# Patient Record
Sex: Male | Born: 1950 | Race: White | Hispanic: No | Marital: Married | Smoking: Never smoker
Health system: Southern US, Community
[De-identification: ages and names within clinical notes are randomized; demographics above are authoritative.]

## PROBLEM LIST (undated history)

## (undated) DIAGNOSIS — I8390 Asymptomatic varicose veins of unspecified lower extremity: Secondary | ICD-10-CM

## (undated) DIAGNOSIS — B353 Tinea pedis: Secondary | ICD-10-CM

## (undated) DIAGNOSIS — R7309 Other abnormal glucose: Secondary | ICD-10-CM

## (undated) DIAGNOSIS — Z86010 Personal history of colonic polyps: Secondary | ICD-10-CM

## (undated) DIAGNOSIS — M79606 Pain in leg, unspecified: Secondary | ICD-10-CM

## (undated) HISTORY — DX: Personal history of colonic polyps: Z86.010

## (undated) HISTORY — DX: Asymptomatic varicose veins of unspecified lower extremity: I83.90

## (undated) HISTORY — DX: Tinea pedis: B35.3

## (undated) HISTORY — DX: Other abnormal glucose: R73.09

## (undated) HISTORY — PX: VARICOSE VEIN SURGERY: SHX832

## (undated) HISTORY — PX: CATARACT EXTRACTION, BILATERAL: SHX1313

## (undated) HISTORY — DX: Pain in leg, unspecified: M79.606

---

## 1997-11-01 HISTORY — PX: KNEE SURGERY: SHX244

## 2002-03-01 ENCOUNTER — Encounter: Payer: Self-pay | Admitting: Internal Medicine

## 2002-03-01 LAB — CONVERTED CEMR LAB

## 2002-03-27 ENCOUNTER — Encounter: Payer: Self-pay | Admitting: Internal Medicine

## 2004-10-28 ENCOUNTER — Ambulatory Visit: Payer: Self-pay | Admitting: Internal Medicine

## 2004-11-25 ENCOUNTER — Ambulatory Visit: Payer: Self-pay | Admitting: Gastroenterology

## 2004-12-09 ENCOUNTER — Encounter: Payer: Self-pay | Admitting: Internal Medicine

## 2004-12-09 ENCOUNTER — Ambulatory Visit: Payer: Self-pay | Admitting: Gastroenterology

## 2005-06-23 ENCOUNTER — Ambulatory Visit: Payer: Self-pay | Admitting: Internal Medicine

## 2005-09-15 ENCOUNTER — Ambulatory Visit: Payer: Self-pay | Admitting: Internal Medicine

## 2005-12-15 ENCOUNTER — Ambulatory Visit: Payer: Self-pay | Admitting: Internal Medicine

## 2005-12-22 ENCOUNTER — Ambulatory Visit: Payer: Self-pay | Admitting: Internal Medicine

## 2006-03-23 ENCOUNTER — Ambulatory Visit: Payer: Self-pay | Admitting: Internal Medicine

## 2007-05-25 ENCOUNTER — Encounter: Payer: Self-pay | Admitting: Internal Medicine

## 2007-05-25 DIAGNOSIS — Z860100 Personal history of colon polyps, unspecified: Secondary | ICD-10-CM

## 2007-05-25 HISTORY — DX: Personal history of colon polyps, unspecified: Z86.0100

## 2007-05-25 HISTORY — DX: Personal history of colonic polyps: Z86.010

## 2007-05-31 ENCOUNTER — Ambulatory Visit: Payer: Self-pay | Admitting: Internal Medicine

## 2007-05-31 LAB — CONVERTED CEMR LAB
Bilirubin Urine: NEGATIVE
Blood in Urine, dipstick: NEGATIVE
Glucose, Urine, Semiquant: NEGATIVE
Ketones, urine, test strip: NEGATIVE
Nitrite: NEGATIVE
Protein, U semiquant: NEGATIVE
Specific Gravity, Urine: 1.02
Urobilinogen, UA: 0.2
WBC Urine, dipstick: NEGATIVE
pH: 8

## 2007-06-01 LAB — CONVERTED CEMR LAB
ALT: 22 units/L (ref 0–53)
AST: 27 units/L (ref 0–37)
Albumin: 4 g/dL (ref 3.5–5.2)
Alkaline Phosphatase: 70 units/L (ref 39–117)
BUN: 18 mg/dL (ref 6–23)
Basophils Absolute: 0 10*3/uL (ref 0.0–0.1)
Basophils Relative: 0 % (ref 0.0–1.0)
Bilirubin, Direct: 0.1 mg/dL (ref 0.0–0.3)
CO2: 30 meq/L (ref 19–32)
Calcium: 9.7 mg/dL (ref 8.4–10.5)
Chloride: 103 meq/L (ref 96–112)
Cholesterol: 182 mg/dL (ref 0–200)
Creatinine, Ser: 1 mg/dL (ref 0.4–1.5)
Eosinophils Absolute: 0.2 10*3/uL (ref 0.0–0.6)
Eosinophils Relative: 3.5 % (ref 0.0–5.0)
GFR calc Af Amer: 99 mL/min
GFR calc non Af Amer: 82 mL/min
Glucose, Bld: 111 mg/dL — ABNORMAL HIGH (ref 70–99)
HCT: 43.5 % (ref 39.0–52.0)
HDL: 37.4 mg/dL — ABNORMAL LOW (ref >39.0)
Hemoglobin: 15.1 g/dL (ref 13.0–17.0)
LDL Cholesterol: 132 mg/dL — ABNORMAL HIGH (ref 0–99)
Lymphocytes Relative: 31.3 % (ref 12.0–46.0)
MCHC: 34.8 g/dL (ref 30.0–36.0)
MCV: 89.6 fL (ref 78.0–100.0)
Monocytes Absolute: 0.4 10*3/uL (ref 0.2–0.7)
Monocytes Relative: 8.4 % (ref 3.0–11.0)
Neutro Abs: 2.8 10*3/uL (ref 1.4–7.7)
Neutrophils Relative %: 56.8 % (ref 43.0–77.0)
PSA: 0.67 ng/mL (ref 0.10–4.00)
Platelets: 260 10*3/uL (ref 150–400)
Potassium: 5.2 meq/L — ABNORMAL HIGH (ref 3.5–5.1)
RBC: 4.85 M/uL (ref 4.22–5.81)
RDW: 12.2 % (ref 11.5–14.6)
Sodium: 139 meq/L (ref 135–145)
TSH: 1.17 microintl units/mL (ref 0.35–5.50)
Total Bilirubin: 1.1 mg/dL (ref 0.3–1.2)
Total CHOL/HDL Ratio: 4.9
Total Protein: 6.3 g/dL (ref 6.0–8.3)
Triglycerides: 63 mg/dL (ref 0–149)
VLDL: 13 mg/dL (ref 0–40)
WBC: 5 10*3/uL (ref 4.5–10.5)

## 2007-06-21 ENCOUNTER — Encounter: Payer: Self-pay | Admitting: Internal Medicine

## 2007-06-21 ENCOUNTER — Ambulatory Visit: Payer: Self-pay | Admitting: Vascular Surgery

## 2007-07-17 ENCOUNTER — Telehealth (INDEPENDENT_AMBULATORY_CARE_PROVIDER_SITE_OTHER): Payer: Self-pay | Admitting: *Deleted

## 2007-07-19 ENCOUNTER — Ambulatory Visit: Payer: Self-pay | Admitting: Internal Medicine

## 2007-07-19 DIAGNOSIS — B353 Tinea pedis: Secondary | ICD-10-CM

## 2007-07-19 HISTORY — DX: Tinea pedis: B35.3

## 2007-08-16 ENCOUNTER — Ambulatory Visit: Payer: Self-pay | Admitting: Internal Medicine

## 2007-08-16 DIAGNOSIS — R079 Chest pain, unspecified: Secondary | ICD-10-CM

## 2007-09-20 ENCOUNTER — Ambulatory Visit: Payer: Self-pay | Admitting: Vascular Surgery

## 2007-09-20 ENCOUNTER — Encounter: Payer: Self-pay | Admitting: Internal Medicine

## 2007-11-01 ENCOUNTER — Ambulatory Visit: Payer: Self-pay | Admitting: Vascular Surgery

## 2007-11-08 ENCOUNTER — Ambulatory Visit: Payer: Self-pay | Admitting: Vascular Surgery

## 2007-12-20 ENCOUNTER — Ambulatory Visit: Payer: Self-pay | Admitting: Vascular Surgery

## 2008-07-10 ENCOUNTER — Ambulatory Visit: Payer: Self-pay | Admitting: Internal Medicine

## 2008-07-10 LAB — CONVERTED CEMR LAB
ALT: 25 units/L (ref 0–53)
AST: 24 units/L (ref 0–37)
Alkaline Phosphatase: 65 units/L (ref 39–117)
BUN: 21 mg/dL (ref 6–23)
Bilirubin Urine: NEGATIVE
Calcium: 8.9 mg/dL (ref 8.4–10.5)
Cholesterol: 148 mg/dL (ref 0–200)
Eosinophils Absolute: 0.2 10*3/uL (ref 0.0–0.7)
Glucose, Bld: 118 mg/dL — ABNORMAL HIGH (ref 70–99)
Glucose, Urine, Semiquant: NEGATIVE
HCT: 44 % (ref 39.0–52.0)
Ketones, urine, test strip: NEGATIVE
Lymphocytes Relative: 29.8 % (ref 12.0–46.0)
MCV: 91.9 fL (ref 78.0–100.0)
Monocytes Absolute: 0.4 10*3/uL (ref 0.1–1.0)
Neutro Abs: 2.8 10*3/uL (ref 1.4–7.7)
PSA: 0.44 ng/mL (ref 0.10–4.00)
Potassium: 4.9 meq/L (ref 3.5–5.1)
RBC: 4.78 M/uL (ref 4.22–5.81)
Sodium: 142 meq/L (ref 135–145)
Total CHOL/HDL Ratio: 3.3
Total Protein: 6.9 g/dL (ref 6.0–8.3)
Urobilinogen, UA: 1
WBC Urine, dipstick: NEGATIVE

## 2008-07-22 ENCOUNTER — Telehealth: Payer: Self-pay | Admitting: Internal Medicine

## 2008-08-07 ENCOUNTER — Ambulatory Visit: Payer: Self-pay | Admitting: Internal Medicine

## 2008-08-07 DIAGNOSIS — R7309 Other abnormal glucose: Secondary | ICD-10-CM

## 2008-08-07 HISTORY — DX: Other abnormal glucose: R73.09

## 2009-08-13 ENCOUNTER — Ambulatory Visit: Payer: Self-pay | Admitting: Internal Medicine

## 2009-11-06 ENCOUNTER — Ambulatory Visit: Payer: Self-pay | Admitting: Family Medicine

## 2009-11-06 DIAGNOSIS — L02419 Cutaneous abscess of limb, unspecified: Secondary | ICD-10-CM

## 2009-11-06 DIAGNOSIS — L03119 Cellulitis of unspecified part of limb: Secondary | ICD-10-CM

## 2009-11-06 LAB — CONVERTED CEMR LAB: Blood Glucose, Fingerstick: 133

## 2009-11-07 ENCOUNTER — Ambulatory Visit: Payer: Self-pay | Admitting: Internal Medicine

## 2009-11-08 ENCOUNTER — Ambulatory Visit: Payer: Self-pay | Admitting: Vascular Surgery

## 2009-11-08 ENCOUNTER — Observation Stay (HOSPITAL_COMMUNITY): Admission: EM | Admit: 2009-11-08 | Discharge: 2009-11-10 | Payer: Self-pay | Admitting: Emergency Medicine

## 2009-11-08 ENCOUNTER — Encounter (INDEPENDENT_AMBULATORY_CARE_PROVIDER_SITE_OTHER): Payer: Self-pay | Admitting: Internal Medicine

## 2009-11-10 ENCOUNTER — Telehealth: Payer: Self-pay | Admitting: Internal Medicine

## 2009-11-13 ENCOUNTER — Encounter (INDEPENDENT_AMBULATORY_CARE_PROVIDER_SITE_OTHER): Payer: Self-pay | Admitting: *Deleted

## 2009-11-13 ENCOUNTER — Ambulatory Visit: Payer: Self-pay | Admitting: Internal Medicine

## 2009-11-19 ENCOUNTER — Ambulatory Visit: Payer: Self-pay | Admitting: Internal Medicine

## 2009-12-16 ENCOUNTER — Telehealth: Payer: Self-pay | Admitting: Internal Medicine

## 2010-01-20 ENCOUNTER — Ambulatory Visit: Payer: Self-pay | Admitting: Family Medicine

## 2010-01-21 ENCOUNTER — Ambulatory Visit: Payer: Self-pay | Admitting: Internal Medicine

## 2010-01-22 ENCOUNTER — Ambulatory Visit: Payer: Self-pay | Admitting: Internal Medicine

## 2010-01-26 ENCOUNTER — Ambulatory Visit: Payer: Self-pay | Admitting: Internal Medicine

## 2010-01-28 ENCOUNTER — Telehealth: Payer: Self-pay | Admitting: Internal Medicine

## 2010-03-10 ENCOUNTER — Telehealth: Payer: Self-pay | Admitting: Internal Medicine

## 2010-03-11 ENCOUNTER — Ambulatory Visit: Payer: Self-pay | Admitting: Internal Medicine

## 2010-03-23 ENCOUNTER — Telehealth: Payer: Self-pay | Admitting: Gastroenterology

## 2010-03-24 ENCOUNTER — Encounter (INDEPENDENT_AMBULATORY_CARE_PROVIDER_SITE_OTHER): Payer: Self-pay | Admitting: *Deleted

## 2010-03-25 ENCOUNTER — Ambulatory Visit: Payer: Self-pay | Admitting: Gastroenterology

## 2010-03-31 ENCOUNTER — Encounter: Payer: Self-pay | Admitting: Gastroenterology

## 2010-03-31 ENCOUNTER — Ambulatory Visit: Payer: Self-pay | Admitting: Gastroenterology

## 2010-03-31 ENCOUNTER — Ambulatory Visit (HOSPITAL_COMMUNITY): Admission: RE | Admit: 2010-03-31 | Discharge: 2010-03-31 | Payer: Self-pay | Admitting: Gastroenterology

## 2010-07-23 ENCOUNTER — Ambulatory Visit: Payer: Self-pay | Admitting: Internal Medicine

## 2010-11-26 ENCOUNTER — Telehealth: Payer: Self-pay | Admitting: Internal Medicine

## 2010-12-01 NOTE — Assessment & Plan Note (Signed)
Summary: FEVER, N/V // RS   Vital Signs:  Patient profile:   60 year old male Temp:     98.6 degrees F oral BP sitting:   120 / 90  (left arm) Cuff size:   regular  Vitals Entered By: Sid Falcon LPN (November 06, 2009 2:51 PM) CC: Fever 3-4 days with nausea and vomiting CBG Result 133   History of Present Illness: Acute visit. Chief complaint is fever. Had nausea and some vomiting on Monday but none since. No diarrhea. Fever ranging from 100-102 for the past 3-4 days. Has difficulty walking secondary to some leg pain. No leg injury. Patient denies cough, sore throat, abdominal pain, or dysuria. Wife works for pediatric office and flu screen negative.  Patient has chronic problems including reported history of hyperglycemia. Takes aspirin and Paxil. No known drug allergies.  Allergies: No Known Drug Allergies  Past History:  Past Medical History: Last updated: 05/25/2007 Colonic polyps, hx of  2006 knee fx   ~1999  Past Surgical History: Last updated: 08/07/2008 knee fx surgery   ~1999 vein stripping  Social History: Last updated: 07/19/2007 Married Never Smoked Regular exercise-no  Review of Systems       The patient complains of fever.  The patient denies chest pain, prolonged cough, and abdominal pain.    Physical Exam  General:  Well-developed,well-nourished,in no acute distress; alert,appropriate and cooperative throughout examination Ears:  External ear exam shows no significant lesions or deformities.  Otoscopic examination reveals clear canals, tympanic membranes are intact bilaterally without bulging, retraction, inflammation or discharge. Hearing is grossly normal bilaterally. Nose:  External nasal examination shows no deformity or inflammation. Nasal mucosa are pink and moist without lesions or exudates. Mouth:  Oral mucosa and oropharynx without lesions or exudates.  Teeth in good repair. Neck:  No deformities, masses, or tenderness noted.supple with no  adenopathy Lungs:  Normal respiratory effort, chest expands symmetrically. Lungs are clear to auscultation, no crackles or wheezes. Heart:  Normal rate and regular rhythm. S1 and S2 normal without gallop, murmur, click, rub or other extra sounds. Abdomen:  Bowel sounds positive,abdomen soft and non-tender without masses, organomegaly or hernias noted. Extremities:  patient has diffuse erythema, moderate swelling and increased warmth of the left lower leg with some erythematous streaks extending as high as the knee. There is erythema down as far as the ankle but not involving the foot. No obvious breaks in skin.   Impression & Recommendations:  Problem # 1:  CELLULITIS, LEG, LEFT (ICD-682.6) Assessment New  patient presents with fairly severe cellulitis left lower extremity with associated fever. Does not appear toxic but is very intense erythema involving almost the entire left leg.  Rocephin 1 gm Im given and start oral Cephalexin and reassess tomorrow with his primary.  His updated medication list for this problem includes:    Cephalexin 500 Mg Caps (Cephalexin) ..... One by mouth three times a day for 10 days.  Orders: Admin of Therapeutic Inj  intramuscular or subcutaneous (09811) Rocephin  250mg  (B1478)  Complete Medication List: 1)  Paxil 20 Mg Tabs (Paroxetine hcl) .... Take 1 tablet by mouth once a day 2)  Aspirin 81 Mg Tabs (Aspirin) .... Take 1 tablet by mouth once a day 3)  Cephalexin 500 Mg Caps (Cephalexin) .... One by mouth three times a day for 10 days.  Other Orders: Glucose, (CBG) (29562) Capillary Blood Glucose/CBG (13086)  Patient Instructions: 1)  Keep left lower extremity elevated. 2)  Use heating  pad to left lower extremity frequently 3)  Schedule followup with your primary physician tomorrow for reassessment. Prescriptions: CEPHALEXIN 500 MG CAPS (CEPHALEXIN) one by mouth three times a day for 10 days.  #30 x 0   Entered and Authorized by:   Evelena Peat  MD   Signed by:   Sid Falcon LPN on 16/08/9603   Method used:   Electronically to        [facility]       [address]       [place], [place]  [address]       Ph: [phone]       Fax: [phone]   RxID:   [id number]    Medication Administration  Injection # 1:    Medication: Rocephin  250mg     Diagnosis: CELLULITIS, LEG, LEFT (ICD-682.6)    Route: IM    Site: LUOQ gluteus    Exp Date: 05/02/2011    Lot #: [id number]    Mfr: Novaplus    Comments: 1 gram given    Patient tolerated injection without complications    Given by: Sid Falcon LPN (November 06, 2009 4:16 PM)  Orders Added: 1)  Est. Patient Level IV [36644] 2)  Glucose, (CBG) [82962] 3)  Capillary Blood Glucose/CBG [82948] 4)  Admin of Therapeutic Inj  intramuscular or subcutaneous [96372] 5)  Rocephin  250mg  [I3474]

## 2010-12-01 NOTE — Progress Notes (Signed)
Summary: leg still swollen  Phone Note Call from Patient   Caller: Patient Call For: Birdie Sons MD Summary of Call: leg still swollen- was D/C'd from hospital 3 weeks ago. No pain or drainage- just swollen- is this to be expected? Initial call taken by: Raechel Ache, RN,  December 16, 2009 1:03 PM  Follow-up for Phone Call        yes, but I'd like him to get compression stockings: 5-20 mmHg knee high put on every morning, remove before going to sleep Follow-up by: Birdie Sons MD,  December 16, 2009 2:41 PM  Additional Follow-up for Phone Call Additional follow up Details #1::        LMOM to get compression stockings at [facility] Additional Follow-up by: Raechel Ache, RN,  December 16, 2009 3:24 PM

## 2010-12-01 NOTE — Assessment & Plan Note (Signed)
Summary: 4 DAY FUP--OK PER DR Hardy Harcum//CCM   History of Present Illness: Acute visit for fever of 103last week and erythema left leg. Complicated cellulitis several months ago which required hospitalization after failed outpatient management.  Same leg involved several months ago. Denies any other symptoms such as sore throat, cough, or urinary symptoms. No nausea or vomiting. No known drug allergies. With recent infection eventually improved with IV antibiotics. No recent injury. reviewed previous notes he is feeling better---no recurent fever  Current Problems (verified): 1)  Cellulitis, Leg, Left  (ICD-682.6) 2)  Hyperglycemia  (ICD-790.29) 3)  Chest Pain  (ICD-786.50) 4)  Tinea Pedis  (ICD-110.4) 5)  Family History Seizures  (ICD-V17.2) 6)  Preventive Health Care  (ICD-V70.0) 7)  Colonic Polyps, Hx of  (ICD-V12.72)  Current Medications (verified): 1)  Paxil 20 Mg  Tabs (Paroxetine Hcl) .... Take 1 Tablet By Mouth Once A Day 2)  Aspirin 81 Mg  Tabs (Aspirin) .... Take 1 Tablet By Mouth Once A Day 3)  Nystatin-Triamcinolone 100000-0.1 Unit/gm-% Crea (Nystatin-Triamcinolone) .... Apply Two Times A Day To Both Feet For 14 Days 4)  Amoxicillin-Pot Clavulanate 875-125 Mg Tabs (Amoxicillin-Pot Clavulanate) .... One By Mouth Two Times A Day For 10 Days  Allergies (verified): No Known Drug Allergies  Past History:  Past Medical History: Last updated: 05/25/2007 Colonic polyps, hx of  2006 knee fx   ~1999  Past Surgical History: Last updated: 08/07/2008 knee fx surgery   ~1999 vein stripping  Family History: Last updated: 06/13/2007 Family History Other cancer-breast,prostate Family History Seizures  Social History: Last updated: 07/19/2007 Married Never Smoked Regular exercise-no  Risk Factors: Exercise: no (07/19/2007)  Risk Factors: Smoking Status: never (11/19/2009)  Physical Exam  General:  alert and well-developed.   Skin:  improving cellulitis of  leg decreased erythema, no pain   Impression & Recommendations:  Problem # 1:  CELLULITIS, LEG, LEFT (ICD-682.6)  much improvied complression stockings complete all antibiotics His updated medication list for this problem includes:    Amoxicillin-pot Clavulanate 875-125 Mg Tabs (Amoxicillin-pot clavulanate) ..... One by mouth two times a day for 10 days  Elevate affected area. Warm moist compresses for 20 minutes every 2 hours while awake. Take antibiotics as directed and take acetaminophen as needed. To be seen in 48-72 hours if no improvement, sooner if worse.  Complete Medication List: 1)  Paxil 20 Mg Tabs (Paroxetine hcl) .... Take 1 tablet by mouth once a day 2)  Aspirin 81 Mg Tabs (Aspirin) .... Take 1 tablet by mouth once a day 3)  Nystatin-triamcinolone 100000-0.1 Unit/gm-% Crea (Nystatin-triamcinolone) .... Apply two times a day to both feet for 14 days 4)  Amoxicillin-pot Clavulanate 875-125 Mg Tabs (Amoxicillin-pot clavulanate) .... One by mouth two times a day for 10 days

## 2010-12-01 NOTE — Letter (Signed)
Summary: Colonoscopy Letter  [facility]  [address] [place], [place] [address]   Phone: [phone]  Fax: [phone]      November 13, 2009 MRN: [id number]   MORRILL BOMKAMP [address] [place], [place]  [address]   Dear Mr. Cieslinski,   According to your medical record, it is time for you to schedule a Colonoscopy. The American Cancer Society recommends this procedure as a method to detect early colon cancer. Patients with a family history of colon cancer, or a personal history of colon polyps or inflammatory bowel disease are at increased risk.  This letter has beeen generated based on the recommendations made at the time of your procedure. If you feel that in your particular situation this may no longer apply, please contact our office.  Please call our office at [phone] to schedule this appointment or to update your records at your earliest convenience.  Thank you for cooperating with [place] to provide you with the very best care possible.   Sincerely,  Judie Petit T. Russella Dar, M.D.  [facility] [phone]

## 2010-12-01 NOTE — Procedures (Signed)
Summary: Colonoscopy  Patient: Shawn Wood Note: All result statuses are Final unless otherwise noted.  Tests: (1) Colonoscopy (COL)   COL Colonoscopy           DONE (C)     [facility]     [address] [place], [place]  [address]           COLONOSCOPY PROCEDURE REPORT           PATIENT:  Shawn, Wood  MR#:  [id number]     BIRTHDATE:  [birth year], 58 yrs. old  GENDER:  male     ENDOSCOPIST:  Judie Petit T. Russella Dar, MD, [facility]           PROCEDURE DATE:  03/31/2010     PROCEDURE:  Colonoscopy 19[address]     ASA CLASS:  Class II     INDICATIONS:  1) follow-up of polyp  2) family history of colon     cancer. Adenomatous polyp, 12/2004. Father with colon cancer. High     risk screening and surveillance.     MEDICATIONS:   Fentanyl 75 mcg IV, Versed 7.5 mg IV     DESCRIPTION OF PROCEDURE:   After the risks benefits and     alternatives of the procedure were thoroughly explained, informed     consent was obtained.  Digital rectal exam was performed and     revealed no abnormalities.   The Pentax Colonoscope [id number]     endoscope was introduced through the anus and advanced to the     cecum, which was identified by both the appendix and ileocecal     valve, without limitations.  The quality of the prep was     excellent, using MoviPrep.  The instrument was then slowly     withdrawn as the colon was fully examined.     <<PROCEDUREIMAGES>>     FINDINGS:  Mild diverticulosis was found in the sigmoid to     descending colon segments.  A normal appearing cecum, ileocecal     valve, and appendiceal orifice were identified. The ascending,     hepatic flexure, transverse, splenic flexure, and rectum appeared     unremarkable. Retroflexed views in the rectum revealed no     abnormalities.  The time to cecum =  3  minutes. The scope was     then withdrawn (time =  10  min) from the patient and the     procedure completed.           COMPLICATIONS:  None           ENDOSCOPIC IMPRESSION:  1) Mild diverticulosis in the sigmoid to descending colon           RECOMMENDATIONS:     1) High fiber diet with liberal fluid intake.     2) Repeat Colonoscopy in 5 years.           Venita Lick. Russella Dar, MD, [facility]           n.     REVISED:  03/31/2010 09:08 AM     eSIGNED:   Venita Lick. Stark at 03/31/2010 09:08 AM           Adonis Brook, [id number]  Note: An exclamation mark (!) indicates a result that was not dispersed into the flowsheet. Document Creation Date: 03/31/2010 9:10 AM _______________________________________________________________________  (1) Order result status: Final Collection or observation date-time: 03/31/2010 09:00 Requested date-time:  Receipt date-time:  Reported date-time:  Referring Physician:   Ordering Physician: Claudette Head [phone]) Specimen Source:  Source: Launa Grill Order Number: [id number] Lab site:

## 2010-12-01 NOTE — Progress Notes (Signed)
Summary: REQ FOR APPT?  Phone Note Call from Patient   Caller: Spouse Steward Drone) [phone] Reason for Call: Talk to Doctor Summary of Call: Pts wife Steward Drone) called to make appt for pt for this Thursday, 11/13/2009---no appts available on Dr Cato Mulligan schedule..... She adv that her husband needs to see Dr Cato Mulligan this Thursday and that Dr Cato Mulligan had called her husband (this pt) to adv him to come in for an OV on Thursday, 11/13/2009 (no notation in EMR?).... Can you advise what time the pt needs to be scheduled on Thursday so that the pts wife can be contacted and advised...?  Pts wife Steward Drone) adv that she can be reached @ [phone] to advise what time on Thursday her husband can be seen by Dr Cato Mulligan.   Initial call taken by: Debbra Riding,  November 10, 2009 4:31 PM  Follow-up for Phone Call        see me thursday at 10 am Follow-up by: Birdie Sons MD,  November 10, 2009 5:19 PM  Additional Follow-up for Phone Call Additional follow up Details #1::        Phone Call Completed-------Called pts wife Steward Drone) and adv her that Dr Cato Mulligan  stated that he will see her husband (this pt) at 10am on Thursday.... Pts wife Steward Drone) acknowledged same.  Additional Follow-up by: Debbra Riding,  November 11, 2009 9:36 AM

## 2010-12-01 NOTE — Progress Notes (Signed)
Summary: requesting labs  Phone Note Call from Patient Call back at Home Phone [phone]   Caller: [facility] call Reason for Call: Acute Illness Summary of Call: Wife would loike to have an order to get psa labs done. ok to order? Initial call taken by: Warnell Forester,  Mar 10, 2010 10:29 AM  Follow-up for Phone Call        ok Follow-up by: Birdie Sons MD,  Mar 10, 2010 11:59 AM  Additional Follow-up for Phone Call Additional follow up Details #1::        Lmoam to return call. Additional Follow-up by: Warnell Forester,  Mar 10, 2010 1:40 PM    Additional Follow-up for Phone Call Additional follow up Details #2::    pt will have labs on 03-11-2009 per instructions. Follow-up by: Warnell Forester,  Mar 10, 2010 1:56 PM

## 2010-12-01 NOTE — Miscellaneous (Signed)
Summary: [facility]  Clinical Lists Changes  Medications: Added new medication of MOVIPREP 100 GM  SOLR (PEG-KCL-NACL-NASULF-NA ASC-C) As per prep instructions. - Signed Rx of MOVIPREP 100 GM  SOLR (PEG-KCL-NACL-NASULF-NA ASC-C) As per prep instructions.;  #1 x 0;  Signed;  Entered by: Ezra Sites RN;  Authorized by: Meryl Dare MD [facility];  Method used: Electronically to [facility] [address], [place], [place], [place]  [address], Ph: [phone], Fax: [phone] Observations: Added new observation of NKA: T (03/25/2010 10:51)    Prescriptions: MOVIPREP 100 GM  SOLR (PEG-KCL-NACL-NASULF-NA ASC-C) As per prep instructions.  #1 x 0   Entered by:   Ezra Sites RN   Authorized by:   Meryl Dare MD [facility]   Signed by:   Ezra Sites RN on 03/25/2010   Method used:   Electronically to        [facility]       [address]       [place], [place]  [address]       Ph: [phone]       Fax: [phone]   RxID:   [id number]

## 2010-12-01 NOTE — Assessment & Plan Note (Signed)
Summary: 1 day fup//ccm   Vital Signs:  Patient profile:   60 year old male Temp:     98.4 degrees F oral  Vitals Entered By: Kern Reap CMA Duncan Dull) (January 22, 2010 9:07 AM) CC: follow-up visit   CC:  follow-up visit.  History of Present Illness: Acute visit for fever of 1033 nights agonight and erythema left leg which they just noticed 2 days ago. Complicated cellulitis several months ago which required hospitalization after failed outpatient management.  Same leg involved several months ago. Denies any other symptoms such as sore throat, cough, or urinary symptoms. No nausea or vomiting. No known drug allergies. With recent infection eventually improved with IV antibiotics. No recent injury. reviewed previous notes he is feeling better---no recurent fever  Current Problems (verified): 1)  Cellulitis, Leg, Left  (ICD-682.6) 2)  Hyperglycemia  (ICD-790.29) 3)  Chest Pain  (ICD-786.50) 4)  Tinea Pedis  (ICD-110.4) 5)  Family History Seizures  (ICD-V17.2) 6)  Preventive Health Care  (ICD-V70.0) 7)  Colonic Polyps, Hx of  (ICD-V12.72)  Current Medications (verified): 1)  Paxil 20 Mg  Tabs (Paroxetine Hcl) .... Take 1 Tablet By Mouth Once A Day 2)  Aspirin 81 Mg  Tabs (Aspirin) .... Take 1 Tablet By Mouth Once A Day 3)  Nystatin-Triamcinolone 100000-0.1 Unit/gm-% Crea (Nystatin-Triamcinolone) .... Apply Two Times A Day To Both Feet For 14 Days 4)  Amoxicillin-Pot Clavulanate 875-125 Mg Tabs (Amoxicillin-Pot Clavulanate) .... One By Mouth Two Times A Day For 10 Days  Allergies (verified): No Known Drug Allergies  Past History:  Past Medical History: Last updated: 05/25/2007 Colonic polyps, hx of  2006 knee fx   ~1999  Past Surgical History: Last updated: 08/07/2008 knee fx surgery   ~1999 vein stripping  Family History: Last updated: 06/13/2007 Family History Other cancer-breast,prostate Family History Seizures  Social History: Last updated:  07/19/2007 Married Never Smoked Regular exercise-no  Risk Factors: Exercise: no (07/19/2007)  Risk Factors: Smoking Status: never (11/19/2009)  Physical Exam  General:  Well-developed,well-nourished,in no acute distress; alert,appropriate and cooperative throughout examination Extremities:  area of erythema 6 x 10 cm left lateral leg. This is minimallywarm to touch and minimally tender. No obvious breaks in skin. Skin:  turgor normal and color normal except for area of cellulitis---some subcutaneous hemorrhage scaling skin of feet   Impression & Recommendations:  Problem # 1:  CELLULITIS, LEG, LEFT (ICD-682.6) Assessment Improved gradually improving continue current medications  he is using otc antifungal to feet for tinea---agree.  His updated medication list for this problem includes:    Amoxicillin-pot Clavulanate 875-125 Mg Tabs (Amoxicillin-pot clavulanate) ..... One by mouth two times a day for 10 days  Complete Medication List: 1)  Paxil 20 Mg Tabs (Paroxetine hcl) .... Take 1 tablet by mouth once a day 2)  Aspirin 81 Mg Tabs (Aspirin) .... Take 1 tablet by mouth once a day 3)  Nystatin-triamcinolone 100000-0.1 Unit/gm-% Crea (Nystatin-triamcinolone) .... Apply two times a day to both feet for 14 days 4)  Amoxicillin-pot Clavulanate 875-125 Mg Tabs (Amoxicillin-pot clavulanate) .... One by mouth two times a day for 10 days

## 2010-12-01 NOTE — Assessment & Plan Note (Signed)
Summary: 6 day rov/njr----PT [facility]SC (BMP) // RS   Vital Signs:  Patient profile:   60 year old male Weight:      236 pounds Temp:     98.1 degrees F Pulse rate:   64 / minute Resp:     12 per minute BP sitting:   124 / 90  (left arm)  Vitals Entered By: Gladis Riffle, RN (November 19, 2009 2:30 PM)   History of Present Illness:  Follow-Up Visit      This is a 60 year old man who presents for Follow-up visit.  The patient denies chest pain and palpitations.  Since the last visit the patient notes no new problems or concerns and a recent hospitilization.  The patient reports taking meds as prescribed.  When questioned about possible medication side effects, the patient notes none.  f/u cellulitis he feels tremendously better no pain, no fever or chills All other systems reviewed and were negative   Preventive Screening-Counseling & Management  Alcohol-Tobacco     Smoking Status: never  Current Problems (verified): 1)  Cellulitis, Leg, Left  (ICD-682.6) 2)  Hyperglycemia  (ICD-790.29) 3)  Chest Pain  (ICD-786.50) 4)  Tinea Pedis  (ICD-110.4) 5)  Family History Seizures  (ICD-V17.2) 6)  Preventive Health Care  (ICD-V70.0) 7)  Colonic Polyps, Hx of  (ICD-V12.72)  Current Medications (verified): 1)  Paxil 20 Mg  Tabs (Paroxetine Hcl) .... Take 1 Tablet By Mouth Once A Day 2)  Aspirin 81 Mg  Tabs (Aspirin) .... Take 1 Tablet By Mouth Once A Day 3)  Nystatin-Triamcinolone 100000-0.1 Unit/gm-% Crea (Nystatin-Triamcinolone) .... Apply Two Times A Day To Both Feet For 14 Days  Allergies (verified): No Known Drug Allergies  Comments:  Nurse/Medical Assistant: 6 day rov--states leg better, completes antibiotic today  The patient's medications were reviewed with the patient's parent and were updated in the Medication List. Gladis Riffle, RN (November 19, 2009 2:31 PM)  Past History:  Past Medical History: Last updated: 05/25/2007 Colonic polyps, hx of  2006 knee fx   ~1999  Past  Surgical History: Last updated: 08/07/2008 knee fx surgery   ~1999 vein stripping  Family History: Last updated: 06/13/2007 Family History Other cancer-breast,prostate Family History Seizures  Social History: Last updated: 07/19/2007 Married Never Smoked Regular exercise-no  Risk Factors: Exercise: no (07/19/2007)  Risk Factors: Smoking Status: never (11/19/2009)  Review of Systems       All other systems reviewed and were negative   Physical Exam  General:  Well-developed,well-nourished,in no acute distress; alert,appropriate and cooperative throughout examination Head:  normocephalic and atraumatic.   Eyes:  pupils equal and pupils round.   Ears:  R ear normal and L ear normal.   Neck:  No deformities, masses, or tenderness noted.supple with no adenopathy Lungs:  Normal respiratory effort, chest expands symmetrically. Lungs are clear to auscultation, no crackles or wheezes. Heart:  Normal rate and regular rhythm. S1 and S2 normal without gallop, murmur, click, rub or other extra sounds. Abdomen:  Bowel sounds positive,abdomen soft and non-tender without masses, organomegaly or hernias noted. Skin:  turgor normal and color normal except for area of cellulitis---some subcutaneous hemorrhage scaling skin of feet Psych:  normally interactive and good eye contact.     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 Take 1 tablet by mouth once a day 2)  Aspirin 81 Mg Tabs (Aspirin) .... Take 1 tablet by mouth once a day 3)  Nystatin-triamcinolone 100000-0.1 Unit/gm-% Crea (Nystatin-triamcinolone) .... Apply two times a day to both feet for 14 days

## 2010-12-01 NOTE — Assessment & Plan Note (Signed)
Summary: FLU SHOT // RS---PT [facility] // RS  Nurse Visit   Allergies: No Known Drug Allergies  Orders Added: 1)  Admin 1st Vaccine [90471] 2)  Flu Vaccine 3yrs + [[address]] Flu Vaccine Consent Questions     Do you have a history of severe allergic reactions to this vaccine? no    Any prior history of allergic reactions to egg and/or gelatin? no    Do you have a sensitivity to the preservative Thimersol? no    Do you have a past history of Guillan-Barre Syndrome? no    Do you currently have an acute febrile illness? no    Have you ever had a severe reaction to latex? no    Vaccine information given and explained to patient? yes    Are you currently pregnant? no    Lot Number:[id number]   Exp Date:05/01/2011   Site Given  Left Deltoid IM .lbflu

## 2010-12-01 NOTE — Assessment & Plan Note (Signed)
Summary: FEVER, LEG EDEMA // RS   Vital Signs:  Patient profile:   60 year old male Temp:     99.0 degrees F oral BP sitting:   120 / 82  (left arm) Cuff size:   large  Vitals Entered By: Sid Falcon LPN (January 20, 2010 9:46 AM) CC: fever, left calf red and painful X 2 days   History of Present Illness: Acute visit for fever of 103 last night and erythema left leg which they just noticed yesterday. Complicated cellulitis several months ago which required hospitalization after failed outpatient management.  Same leg involved several months ago. Denies any other symptoms such as sore throat, cough, or urinary symptoms. No nausea or vomiting. No known drug allergies. With recent infection eventually improved with IV antibiotics. No recent injury.  Allergies (verified): No Known Drug Allergies  Past History:  Past Medical History: Last updated: 05/25/2007 Colonic polyps, hx of  2006 knee fx   ~1999 PMH reviewed for relevance  Review of Systems  The patient denies prolonged cough, headaches, hemoptysis, abdominal pain, melena, hematochezia, severe indigestion/heartburn, hematuria, incontinence, and enlarged lymph nodes.    Physical Exam  General:  Well-developed,well-nourished,in no acute distress; alert,appropriate and cooperative throughout examination Ears:  External ear exam shows no significant lesions or deformities.  Otoscopic examination reveals clear canals, tympanic membranes are intact bilaterally without bulging, retraction, inflammation or discharge. Hearing is grossly normal bilaterally. Mouth:  Oral mucosa and oropharynx without lesions or exudates.  Teeth in good repair. Neck:  No deformities, masses, or tenderness noted. Lungs:  Normal respiratory effort, chest expands symmetrically. Lungs are clear to auscultation, no crackles or wheezes. Heart:  Normal rate and regular rhythm. S1 and S2 normal without gallop, murmur, click, rub or other extra sounds. Abdomen:   soft and non-tender.   Extremities:  area of erythema 6 x 10 cm left lateral leg. This is warm to touch and minimally tender. No obvious breaks in skin.   Impression & Recommendations:  Problem # 1:  CELLULITIS, LEG, LEFT (ICD-682.6) Assessment New  recurrent cellulitis left lower extremity. Given previous severe episode even though somewhat less involved today we'll go ahead with Rocephin 1 g and start Augmentin and close followup with primary physician  His updated medication list for this problem includes:    Amoxicillin-pot Clavulanate 875-125 Mg Tabs (Amoxicillin-pot clavulanate) ..... One by mouth two times a day for 10 days  Orders: Rocephin  250mg  (U0454) Admin of Therapeutic Inj  intramuscular or subcutaneous (09811)  Complete Medication List: 1)  Paxil 20 Mg Tabs (Paroxetine hcl) .... Take 1 tablet by mouth once a day 2)  Aspirin 81 Mg Tabs (Aspirin) .... Take 1 tablet by mouth once a day 3)  Nystatin-triamcinolone 100000-0.1 Unit/gm-% Crea (Nystatin-triamcinolone) .... Apply two times a day to both feet for 14 days 4)  Amoxicillin-pot Clavulanate 875-125 Mg Tabs (Amoxicillin-pot clavulanate) .... One by mouth two times a day for 10 days  Patient Instructions: 1)  Elevate legs frequently. 2)  Schedule followup with your regular physician within the next 24-48 hours. 3)  Followup sooner if he developed any worsening fever or worsening redness. Prescriptions: AMOXICILLIN-POT CLAVULANATE 875-125 MG TABS (AMOXICILLIN-POT CLAVULANATE) one by mouth two times a day for 10 days  #20 x 0   Entered and Authorized by:   Evelena Peat MD   Signed by:   Evelena Peat MD on 01/20/2010   Method used:   Electronically to        [facility]       [address]       [place], [place]  [address]       Ph: [phone]       Fax: [phone]   RxID:   [id number]    Medication Administration  Injection # 1:    Medication:  Rocephin  250mg     Diagnosis: CELLULITIS, LEG, LEFT (ICD-682.6)    Route: IM    Site: RUOQ gluteus    Exp Date: 05/01/2012    Lot #: [id number]    Mfr: Sander Radon plus    Comments: 1 gram given    Patient tolerated injection without complications    Given by: Sid Falcon LPN (January 20, 2010 10:33 AM)  Orders Added: 1)  Rocephin  250mg  [J0696] 2)  Admin of Therapeutic Inj  intramuscular or subcutaneous [96372] 3)  Est. Patient Level IV [72536]

## 2010-12-01 NOTE — Assessment & Plan Note (Signed)
Summary: 24 hour follow-up//ccm   Vital Signs:  Patient profile:   60 year old male Temp:     98.7 degrees F oral BP sitting:   120 / 84  (left arm) Cuff size:   large  Vitals Entered By: Kern Reap CMA Duncan Dull) (January 21, 2010 11:52 AM)  History of Present Illness: Acute visit for fever of 103 2 nights ago and erythema left leg which they just noticed yesterday. Complicated cellulitis several months ago which required hospitalization after failed outpatient management.  Same leg involved several months ago. Denies any other symptoms such as sore throat, cough, or urinary symptoms. No nausea or vomiting. No known drug allergies. With recent infection eventually improved with IV antibiotics. No recent injury. reviewed Dr. Lucie Leather note from yesterday  All other systems reviewed and were negative   Current Problems (verified): 1)  Cellulitis, Leg, Left  (ICD-682.6) 2)  Hyperglycemia  (ICD-790.29) 3)  Chest Pain  (ICD-786.50) 4)  Tinea Pedis  (ICD-110.4) 5)  Family History Seizures  (ICD-V17.2) 6)  Preventive Health Care  (ICD-V70.0) 7)  Colonic Polyps, Hx of  (ICD-V12.72)  Current Medications (verified): 1)  Paxil 20 Mg  Tabs (Paroxetine Hcl) .... Take 1 Tablet By Mouth Once A Day 2)  Aspirin 81 Mg  Tabs (Aspirin) .... Take 1 Tablet By Mouth Once A Day 3)  Nystatin-Triamcinolone 100000-0.1 Unit/gm-% Crea (Nystatin-Triamcinolone) .... Apply Two Times A Day To Both Feet For 14 Days 4)  Amoxicillin-Pot Clavulanate 875-125 Mg Tabs (Amoxicillin-Pot Clavulanate) .... One By Mouth Two Times A Day For 10 Days  Allergies (verified): No Known Drug Allergies  Physical Exam  General:  Well-developed,well-nourished,in no acute distress; alert,appropriate and cooperative throughout examination Head:  normocephalic and atraumatic.   Extremities:  area of erythema 6 x 10 cm left lateral leg. This is warm to touch and minimally tender. No obvious breaks in skin. Skin:  turgor normal and  color normal except for area of cellulitis---some subcutaneous hemorrhage scaling skin of feet Cervical Nodes:  no anterior cervical adenopathy and no posterior cervical adenopathy.     Impression & Recommendations:  Problem # 1:  CELLULITIS, LEG, LEFT (ICD-682.6)  clinically improved keep leg elevated injection rocephin His updated medication list for this problem includes:    Amoxicillin-pot Clavulanate 875-125 Mg Tabs (Amoxicillin-pot clavulanate) ..... One by mouth two times a day for 10 days  Orders: Rocephin  250mg  (B2841) Admin of Therapeutic Inj  intramuscular or subcutaneous (32440)  Complete Medication List: 1)  Paxil 20 Mg Tabs (Paroxetine hcl) .... Take 1 tablet by mouth once a day 2)  Aspirin 81 Mg Tabs (Aspirin) .... Take 1 tablet by mouth once a day 3)  Nystatin-triamcinolone 100000-0.1 Unit/gm-% Crea (Nystatin-triamcinolone) .... Apply two times a day to both feet for 14 days 4)  Amoxicillin-pot Clavulanate 875-125 Mg Tabs (Amoxicillin-pot clavulanate) .... One by mouth two times a day for 10 days  Patient Instructions: 1)  see me tomorrow   Medication Administration  Injection # 1:    Medication: Rocephin  250mg     Diagnosis: CELLULITIS, LEG, LEFT (ICD-682.6)    Route: IM    Site: LUOQ gluteus    Exp Date: 07/02/2012    Lot #: [id number]    Mfr: sandoz     Comments: 1 gm given    Patient tolerated injection without complications    Given by: Kern Reap CMA Duncan Dull) (January 21, 2010 12:34 PM)  Orders Added: 1)  Rocephin  250mg  [J0696] 2)  Admin  of Therapeutic Inj  intramuscular or subcutaneous [96372] 3)  Est. Patient Level III [04540]

## 2010-12-01 NOTE — Progress Notes (Signed)
Summary: speak ot nurse  Phone Note Call from Patient Call back at Home Phone [phone] Call back at [phone]   Caller: Spouse Call For: Lousie Calico Summary of Call: Patient would like to have his colon done before 6-1 because he is not going to have any insurance after 6-1, but theres nothing available so he wants to know if any other doctor can do his procedure. Initial call taken by: Tawni Levy,  Mar 23, 2010 2:25 PM  Follow-up for Phone Call        Left message for patient to call back Darcey Nora RN, [facility]  Mar 23, 2010 2:36 PM  Patient has lost his job and his insurance will expire on 03/31/10.  He wants to schedule a colon for 03/31/10 with any GI MD, this is his only available day.  I have reviewed all [facility] schedules for all MD's and there are no openings next Tuesday.  I have advised the patient that there are no availabilities.  He is advised that when he gets insurance again he is past due for his colon recall and should have this done when he is able.  Dr Russella Dar please advise if you are available Follow-up by: Darcey Nora RN, CGRN,  Mar 24, 2010 9:02 AM  Additional Follow-up for Phone Call Additional follow up Details #1::        Patient  is scheduled at [facility] for 03/31/10 at 8:30.  He needs to confirm with his wife she can bring him. He will call me back to confirm appointment and set up pre-visit. Additional Follow-up by: Darcey Nora RN, CGRN,  Mar 24, 2010 12:36 PM    Additional Follow-up for Phone Call Additional follow up Details #2::    OK.  Follow-up by: Meryl Dare MD Clementeen Graham,  Mar 24, 2010 1:46 PM

## 2010-12-01 NOTE — Letter (Signed)
Summary: [facility] Instructions  [facility]  [address] [place], [place] [address]   Phone: [phone]  Fax: [phone]       Shawn Wood    [birth year]    MRN: [id number]        Procedure Day Dorna Bloom:  Shawn Wood  03/31/10     Arrival Time:  7:30am     Procedure Time:  8:30am     Location of Procedure:                     [name] _  [facility] ( Outpatient Registration)                        PREPARATION FOR COLONOSCOPY WITH MOVIPREP   Starting 5 days prior to your procedure THURSDAY  05/26  do not eat nuts, seeds, popcorn, corn, beans, peas,  salads, or any raw vegetables.  Do not take any fiber supplements (e.g. Metamucil, Citrucel, and Benefiber).  THE DAY BEFORE YOUR PROCEDURE         DATE: MONDAY  05/30  1.  Drink clear liquids the entire day-NO SOLID FOOD  2.  Do not drink anything colored red or purple.  Avoid juices with pulp.  No orange juice.  3.  Drink at least 64 oz. (8 glasses) of fluid/clear liquids during the day to prevent dehydration and help the prep work efficiently.  CLEAR LIQUIDS INCLUDE: Water Jello Ice Popsicles Tea (sugar ok, no milk/cream) Powdered fruit flavored drinks Coffee (sugar ok, no milk/cream) Gatorade Juice: apple, white grape, white cranberry  Lemonade Clear bullion, consomm, broth Carbonated beverages (any kind) Strained chicken noodle soup Hard Candy                             4.  In the morning, mix first dose of MoviPrep solution:    Empty 1 Pouch A and 1 Pouch B into the disposable container    Add lukewarm drinking water to the top line of the container. Mix to dissolve    Refrigerate (mixed solution should be used within 24 hrs)  5.  Begin drinking the prep at 5:00 p.m. The MoviPrep container is divided by 4 marks.   Every 15 minutes drink the solution down to the next mark (approximately 8 oz) until the full liter is complete.   6.  Follow completed prep with 16 oz of clear liquid of your  choice (Nothing red or purple).  Continue to drink clear liquids until bedtime.  7.  Before going to bed, mix second dose of MoviPrep solution:    Empty 1 Pouch A and 1 Pouch B into the disposable container    Add lukewarm drinking water to the top line of the container. Mix to dissolve    Refrigerate  THE DAY OF YOUR PROCEDURE      DATE: TUESDAY  05/31  Beginning at  3:30 a.m. (5 hours before procedure):         1. Every 15 minutes, drink the solution down to the next mark (approx 8 oz) until the full liter is complete.  2. Follow completed prep with 16 oz. of clear liquid of your choice.    3. NPO for 4 hours prior to procedure.-Starting at 4:30am   MEDICATION INSTRUCTIONS  Unless otherwise instructed, you should take regular prescription medications with a small sip of water   as early as  possible the morning of your procedure.          OTHER INSTRUCTIONS  You will need a responsible adult at least 62 years of age to accompany you and drive you home.   This person must remain in the waiting room during your procedure.  Wear loose fitting clothing that is easily removed.  Leave jewelry and other valuables at home.  However, you may wish to bring a book to read or  an iPod/MP3 player to listen to music as you wait for your procedure to start.  Remove all body piercing jewelry and leave at home.  Total time from sign-in until discharge is approximately 2-3 hours.  You should go home directly after your procedure and rest.  You can resume normal activities the  day after your procedure.  The day of your procedure you should not:   Drive   Make legal decisions   Operate machinery   Drink alcohol   Return to work  You will receive specific instructions about eating, activities and medications before you leave.    The above instructions have been reviewed and explained to me by  Ezra Sites RN  Mar 25, 2010 11:22 AM     I fully understand and can  verbalize these instructions _____________________________ Date _________

## 2010-12-01 NOTE — Progress Notes (Signed)
Summary: compression stockings  Phone Note Other Incoming Call back at [phone]   Caller: Bobby at [employer] of Call: Pt has Rx for compression stockings 20/40.  Only available at 20/30 or 30/40.  Using 15/20 at present.  Advise which. Initial call taken by: Gladis Riffle, RN,  January 28, 2010 11:31 AM  Follow-up for Phone Call        per dr Angel Weedon 20/30.  Bobby notified. Follow-up by: Gladis Riffle, RN,  January 28, 2010 11:31 AM

## 2010-12-01 NOTE — Assessment & Plan Note (Signed)
Summary: POST HOSP F/U // RS   Vital Signs:  Patient profile:   60 year old male Weight:      235 pounds Temp:     98.2 degrees F Pulse rate:   88 / minute BP sitting:   134 / 90  (left arm)  Vitals Entered By: Gladis Riffle, RN (November 13, 2009 10:20 AM)   History of Present Illness: f/u cellulitis see hospital notes discussed with patient and wife he is feeling some better no recurrent fever no chills leg still quite painful (7/10)  All other systems reviewed and were negative   Preventive Screening-Counseling & Management  Alcohol-Tobacco     Smoking Status: never  Current Problems (verified): 1)  Cellulitis, Leg, Left  (ICD-682.6) 2)  Hyperglycemia  (ICD-790.29) 3)  Chest Pain  (ICD-786.50) 4)  Tinea Pedis  (ICD-110.4) 5)  Family History Seizures  (ICD-V17.2) 6)  Preventive Health Care  (ICD-V70.0) 7)  Colonic Polyps, Hx of  (ICD-V12.72)  Current Medications (verified): 1)  Paxil 20 Mg  Tabs (Paroxetine Hcl) .... Take 1 Tablet By Mouth Once A Day 2)  Aspirin 81 Mg  Tabs (Aspirin) .... Take 1 Tablet By Mouth Once A Day 3)  Nystatin-Triamcinolone 100000-0.1 Unit/gm-% Crea (Nystatin-Triamcinolone) .... Apply Two Times A Day To Both Feet For 14 Days  Allergies (verified): No Known Drug Allergies  Comments:  Nurse/Medical Assistant: hospital FU cellulitis left lower leg  The patient's medications and allergies were reviewed with the patient and were updated in the Medication and Allergy Lists. Gladis Riffle, RN (November 13, 2009 10:21 AM)  Past History:  Past Medical History: Last updated: 05/25/2007 Colonic polyps, hx of  2006 knee fx   ~1999  Past Surgical History: Last updated: 08/07/2008 knee fx surgery   ~1999 vein stripping  Family History: Last updated: 06/13/2007 Family History Other cancer-breast,prostate Family History Seizures  Social History: Last updated: 07/19/2007 Married Never Smoked Regular exercise-no  Risk Factors: Exercise:  no (07/19/2007)  Risk Factors: Smoking Status: never (11/13/2009)  Review of Systems       fatigue, leg pain All other systems reviewed and were negative   Physical Exam  General:  alert and well-developed.   Head:  normocephalic and atraumatic.   Eyes:  pupils equal and pupils round.   Ears:  R ear normal and L ear normal.   Neck:  No deformities, masses, or tenderness noted.supple with no adenopathy Lungs:  Normal respiratory effort, chest expands symmetrically. Lungs are clear to auscultation, no crackles or wheezes. Heart:  Normal rate and regular rhythm. S1 and S2 normal without gallop, murmur, click, rub or other extra sounds. Abdomen:  Bowel sounds positive,abdomen soft and non-tender without masses, organomegaly or hernias noted. Msk:  No deformity or scoliosis noted of thoracic or lumbar spine.   Pulses:  R radial normal, R posterior tibial normal, R dorsalis pedis normal, and L radial normal.   Extremities:  2 + edema right leg to mid calf Neurologic:  cranial nerves II-XII intact and gait normal.   Skin:  marked erythema with subcutaneious hemorrhagic blister right leg below knee... erythema significantly improved compared to last exam Cervical Nodes:  no anterior cervical adenopathy and no posterior cervical adenopathy.   Inguinal Nodes:  no R inguinal adenopathy and no L inguinal adenopathy.     Impression & Recommendations:  Problem # 1:  CELLULITIS, LEG, LEFT (ICD-682.6) improving see me next week  reviewed hospital notes and labs continue doxycycline call for any concerns keep  leg elevated discussed all with patient and wife  Complete Medication List: 1)  Paxil 20 Mg Tabs (Paroxetine hcl) .... Take 1 tablet by mouth once a day 2)  Aspirin 81 Mg Tabs (Aspirin) .... Take 1 tablet by mouth once a day 3)  Nystatin-triamcinolone 100000-0.1 Unit/gm-% Crea (Nystatin-triamcinolone) .... Apply two times a day to both feet for 14 days  Patient Instructions: 1)   see me wednesday

## 2010-12-01 NOTE — Assessment & Plan Note (Signed)
Summary: rov/mm   Vital Signs:  Patient profile:   60 year old male Temp:     99.3 degrees F Pulse rate:   96 / minute Resp:     14 per minute BP sitting:   136 / 90  (right arm)  Vitals Entered By: Gladis Riffle, RN (November 07, 2009 8:28 AM)   History of Present Illness: see dr burchette's note pt continues with leg pain, redness, warmth  Preventive Screening-Counseling & Management  Alcohol-Tobacco     Smoking Status: never  Allergies (verified): No Known Drug Allergies  Comments:  Nurse/Medical Assistant: seen yesterday for fever and nausea and vomiting caused by cellulitis left leg and told to return today, had injection yesterday in office and began cephalexin this AM--nausea and vomiting resolved--states feels better except leg still hurts  The patient's medications and allergies were reviewed with the patient and were updated in the Medication and Allergy Lists. Gladis Riffle, RN (November 07, 2009 8:29 AM)  Physical Exam  General:  Well-developed,well-nourished,in no acute distress; alert,appropriate and cooperative throughout examination Head:  normocephalic and atraumatic.   Eyes:  pupils equal and pupils round.   Ears:  R ear normal and L ear normal.   Neck:  No deformities, masses, or tenderness noted.supple with no adenopathy Lungs:  Normal respiratory effort, chest expands symmetrically. Lungs are clear to auscultation, no crackles or wheezes. Heart:  Normal rate and regular rhythm. S1 and S2 normal without gallop, murmur, click, rub or other extra sounds. Abdomen:  Bowel sounds positive,abdomen soft and non-tender without masses, organomegaly or hernias noted. Msk:  No deformity or scoliosis noted of thoracic or lumbar spine.   Extremities:  patient has diffuse erythema, moderate swelling and increased warmth of the left lower leg with some erythematous streaks extending as high as the knee. There is erythema down as far as the ankle but not involving the foot.  No obvious breaks in skin. Skin:  turgor normal and color normal.   scaling skin of feet   Impression & Recommendations:  Problem # 1:  CELLULITIS, LEG, LEFT (ICD-682.6) rocephin doxycycline side effects discussed Spoke with dr. Aron---he may need f/u tomorrow he and wife understands His updated medication list for this problem includes:    Doxycycline Hyclate 100 Mg Caps (Doxycycline hyclate) .[facility]... Take 1 tab twice a day  discussed potential worsening of sxs may need f/u tomorrow to ED for any concerns kee leg elevated see me Monday AM tinea feet---nystatin / triamcinolone  Complete Medication List: 1)  Paxil 20 Mg Tabs (Paroxetine hcl) .... Take 1 tablet by mouth once a day 2)  Aspirin 81 Mg Tabs (Aspirin) .... Take 1 tablet by mouth once a day 3)  Doxycycline Hyclate 100 Mg Caps (Doxycycline hyclate) .... Take 1 tab twice a day 4)  Nystatin-triamcinolone 100000-0.1 Unit/gm-% Crea (Nystatin-triamcinolone) .... Apply two times a day to both feet for 14 days Prescriptions: NYSTATIN-TRIAMCINOLONE 100000-0.1 UNIT/GM-% CREA (NYSTATIN-TRIAMCINOLONE) apply two times a day to both feet for 14 days  #30 grams x 1   Entered and Authorized by:   Birdie Sons MD   Signed by:   Birdie Sons MD on 11/07/2009   Method used:   Electronically to        [facility]       [address]       [place], [place]  [address]       Ph: [phone]  Fax: [phone]   RxID:   [id number] DOXYCYCLINE HYCLATE 100 MG CAPS (DOXYCYCLINE HYCLATE) Take 1 tab twice a day  #20 x 0   Entered and Authorized by:   Birdie Sons MD   Signed by:   Birdie Sons MD on 11/07/2009   Method used:   Electronically to        [facility]       [address]       [place], [place]  [address]       Ph: [phone]       Fax: [phone]   RxID:   [id number]   Appended Document: rov/mm   Medication  Administration  Injection # 1:    Medication: Rocephin  250mg     Diagnosis: CELLULITIS, LEG, LEFT (ICD-682.6)    Route: IM    Site: RUOQ gluteus    Exp Date: 10/02/2011    Lot #: [id number]    Mfr: sandoz    Comments:  ! gm given    Patient tolerated injection without complications    Given by: Gladis Riffle, RN (November 07, 2009 9:02 AM)  Orders Added: 1)  Rocephin  250mg  [J0696] 2)  Admin of Therapeutic Inj  intramuscular or subcutaneous [[id number]]

## 2010-12-03 NOTE — Progress Notes (Signed)
Summary: REQUEST FOR ORDER  Phone Note Call from Patient   Caller: Spouse Summary of Call: WOULD LIKE TO HAVE PSA ADDED TO CPX LABS IN APRIL.... OK FOR SAME TO BE ADDED? Initial call taken by: Debbra Riding,  November 26, 2010 3:18 PM  Follow-up for Phone Call        would be standard with labs Follow-up by: Birdie Sons MD,  November 26, 2010 5:46 PM  Additional Follow-up for Phone Call Additional follow up Details #1::        Will add PSA to cpx labs (cbc w/ diff, TSH, UA, Lipid/Hepatic Pnl, BMET).  Additional Follow-up by: Debbra Riding,  November 27, 2010 10:11 AM

## 2010-12-07 ENCOUNTER — Encounter: Payer: Self-pay | Admitting: Internal Medicine

## 2010-12-07 ENCOUNTER — Telehealth: Payer: Self-pay | Admitting: Internal Medicine

## 2010-12-07 ENCOUNTER — Ambulatory Visit (INDEPENDENT_AMBULATORY_CARE_PROVIDER_SITE_OTHER): Payer: PRIVATE HEALTH INSURANCE | Admitting: Internal Medicine

## 2010-12-07 DIAGNOSIS — L02419 Cutaneous abscess of limb, unspecified: Secondary | ICD-10-CM

## 2010-12-07 DIAGNOSIS — L03119 Cellulitis of unspecified part of limb: Secondary | ICD-10-CM

## 2010-12-07 DIAGNOSIS — R7309 Other abnormal glucose: Secondary | ICD-10-CM

## 2010-12-07 DIAGNOSIS — Z86010 Personal history of colonic polyps: Secondary | ICD-10-CM

## 2010-12-07 MED ORDER — DOXYCYCLINE HYCLATE 100 MG PO TABS: 100.0000 mg | ORAL_TABLET | Freq: Two times a day (BID) | ORAL | Status: AC

## 2010-12-07 MED ORDER — DOXYCYCLINE HYCLATE 100 MG PO TABS: 100.0000 mg | ORAL_TABLET | Freq: Two times a day (BID) | ORAL | Status: DC

## 2010-12-07 MED ORDER — CEFTRIAXONE SODIUM 1 G IJ SOLR
1.0000 g | INTRAMUSCULAR | Status: DC
  Administered 2010-12-07 – 2010-12-08 (×2): 1 g via INTRAMUSCULAR

## 2010-12-07 NOTE — Telephone Encounter (Signed)
Dr Cato Mulligan will send in med electronically to [facility].  Pt's wife aware

## 2010-12-07 NOTE — Progress Notes (Signed)
  Subjective:    Patient ID: Shawn Wood, male    DOB: [birth year], 60 y.o.   MRN: [id number]  HPI  Patient comes in for evaluation of cellulitis. Patient has a history of cellulitis one year ago. Was quite dramatic at that time. 2 nights ago he woke up with fever and nausea. Went to urgent care Center yesterday was treated with Rocephin IM and cephalexin by mouth. I think the cellulitis is somewhat worse today. He denies any fevers or nausea though. Patient has been told to wear compressive stockings in the past he has not been doing so. Prior to this recurrence of cellulitis he has been feeling well. He denies significant pain or the like.  Review of Systems Other specific complaints in the complete review of systems.    Objective:   Physical Exam Well-developed well-nourished male in no acute distress. HEENT exam atraumatic, normocephalic, neck supple. Chest clear to auscultation cardiac exam S1-S2 are regular not tachycardic. Abdomen; soft, soft. Extremities there is no clubbing cyanosis or edema. There is marked erythema of the left leg from the ankle to 4 inches below the knee. There is a daily, erythematous rash of the left foot on the dorsal aspect.       Assessment & Plan:

## 2010-12-07 NOTE — Assessment & Plan Note (Signed)
Recurrent cellulitis. I am encouraged that his symptoms are improving however the leg apparently appear somewhat worse. I'll give him another gram of Rocephin in the office today. All changes antibiotic to doxycycline. We'll see the patient back tomorrow. This was discussed with the patient and his wife. They know if he gets dramatically worsened to the emergency department or call me back.

## 2010-12-07 NOTE — Telephone Encounter (Signed)
Pt called and said that he went to [facility] on [address] to pick up med that Dr Timoteo Gaul wanted pt to try and nothing has been called in yet. Pls call in asap. Pt at pharmacy.

## 2010-12-08 ENCOUNTER — Encounter: Payer: Self-pay | Admitting: Internal Medicine

## 2010-12-08 ENCOUNTER — Ambulatory Visit (INDEPENDENT_AMBULATORY_CARE_PROVIDER_SITE_OTHER): Payer: PRIVATE HEALTH INSURANCE | Admitting: Internal Medicine

## 2010-12-08 VITALS — BP 114/80 | HR 112

## 2010-12-08 DIAGNOSIS — L02419 Cutaneous abscess of limb, unspecified: Secondary | ICD-10-CM

## 2010-12-09 ENCOUNTER — Telehealth: Payer: Self-pay | Admitting: *Deleted

## 2010-12-11 ENCOUNTER — Ambulatory Visit (INDEPENDENT_AMBULATORY_CARE_PROVIDER_SITE_OTHER): Payer: PRIVATE HEALTH INSURANCE | Admitting: Internal Medicine

## 2010-12-11 ENCOUNTER — Encounter: Payer: Self-pay | Admitting: Internal Medicine

## 2010-12-11 DIAGNOSIS — L03119 Cellulitis of unspecified part of limb: Secondary | ICD-10-CM

## 2010-12-11 DIAGNOSIS — L02419 Cutaneous abscess of limb, unspecified: Secondary | ICD-10-CM

## 2010-12-11 NOTE — Progress Notes (Signed)
  Subjective:    Patient ID: Shawn Wood, male    DOB: [birth year], 60 y.o.   MRN: [id number]  HPI   patient here for followup of cellulitis. Reviewed previous notes. His leg is feeling better. No fever, chills.  Review of Systems No other specific complaints in a complete review of systems.    Objective:   Physical Exam       well-developed male no acute distress. Neck is supple chest her auscultation. Extremities he has a purpuric-appearing  Discoloration of the left leg where previously he had marked erythema  Assessment & Plan:

## 2010-12-11 NOTE — Assessment & Plan Note (Signed)
Marked improvement of cellulitis. Continue current medications I'll recheck in one week.

## 2010-12-12 NOTE — Assessment & Plan Note (Signed)
Patient with recurrent cellulitis. Reviewed previous notes. On examination today leg is much improved. Will treat with one dose of Rocephin. Continue doxycycline. He'll follow up with me in 3 days. Call sooner with any symptoms. Total time 15 minutes greater than half spent counseling regarding cellulitis and need for compression stockings. Wife present during evaluation.

## 2010-12-12 NOTE — Progress Notes (Signed)
  Subjective:    Patient ID: Shawn Wood, male    DOB: 26-May-1951, 60 y.o.   MRN: [id number]  HPI   patient comes in for followup of cellulitis. She assessment and plan. Symptoms are better. Patient comes in with his wife who agrees with the evaluation.  Review of Systems     Objective:   Physical Exam        Assessment & Plan:

## 2010-12-16 ENCOUNTER — Encounter: Payer: Self-pay | Admitting: Internal Medicine

## 2010-12-17 ENCOUNTER — Ambulatory Visit (INDEPENDENT_AMBULATORY_CARE_PROVIDER_SITE_OTHER): Payer: PRIVATE HEALTH INSURANCE | Admitting: Internal Medicine

## 2010-12-17 DIAGNOSIS — L03119 Cellulitis of unspecified part of limb: Secondary | ICD-10-CM

## 2010-12-17 DIAGNOSIS — L02419 Cutaneous abscess of limb, unspecified: Secondary | ICD-10-CM

## 2010-12-17 NOTE — Telephone Encounter (Signed)
Opened in error

## 2010-12-17 NOTE — Progress Notes (Signed)
  Subjective:    Patient ID: Shawn Wood, male    DOB: Feb 22, 1951, 60 y.o.   MRN: [id number]  HPI    patient comes in for followup of cellulitis. See assessment and plan. Symptoms are better.   Review of Systems      Objective:   Physical Exam         Assessment & Plan:

## 2010-12-17 NOTE — Assessment & Plan Note (Signed)
Marked improvement of cellulitis.   symptoms are essentially completely resolved. He still has some erythema of the leg but it appears to be healing. He'll wear compression stockings. No further evaluation necessary.

## 2011-01-05 ENCOUNTER — Other Ambulatory Visit: Payer: Self-pay | Admitting: Internal Medicine

## 2011-01-12 ENCOUNTER — Other Ambulatory Visit: Payer: Self-pay | Admitting: Internal Medicine

## 2011-01-17 LAB — CBC
HCT: 41.4 % (ref 39.0–52.0)
Hemoglobin: 13.4 g/dL (ref 13.0–17.0)
Hemoglobin: 13.7 g/dL (ref 13.0–17.0)
MCHC: 34.3 g/dL (ref 30.0–36.0)
MCHC: 34.5 g/dL (ref 30.0–36.0)
MCHC: 34.6 g/dL (ref 30.0–36.0)
MCV: 89.8 fL (ref 78.0–100.0)
MCV: 90.3 fL (ref 78.0–100.0)
Platelets: 208 10*3/uL (ref 150–400)
Platelets: 240 10*3/uL (ref 150–400)
RBC: 4.32 MIL/uL (ref 4.22–5.81)
RBC: 4.42 MIL/uL (ref 4.22–5.81)
RBC: 4.58 MIL/uL (ref 4.22–5.81)
RDW: 12.5 % (ref 11.5–15.5)
RDW: 12.5 % (ref 11.5–15.5)
WBC: 7.7 10*3/uL (ref 4.0–10.5)
WBC: 8.2 10*3/uL (ref 4.0–10.5)

## 2011-01-17 LAB — BASIC METABOLIC PANEL
BUN: 17 mg/dL (ref 6–23)
CO2: 27 mEq/L (ref 19–32)
Calcium: 8.6 mg/dL (ref 8.4–10.5)
Chloride: 107 mEq/L (ref 96–112)
Chloride: 98 mEq/L (ref 96–112)
Creatinine, Ser: 0.95 mg/dL (ref 0.4–1.5)
Creatinine, Ser: 0.97 mg/dL (ref 0.4–1.5)
GFR calc Af Amer: >60 mL/min (ref >60)
Glucose, Bld: 114 mg/dL — ABNORMAL HIGH (ref 70–99)
Sodium: 131 mEq/L — ABNORMAL LOW (ref 135–145)
Sodium: 140 mEq/L (ref 135–145)

## 2011-01-17 LAB — COMPREHENSIVE METABOLIC PANEL
AST: 37 U/L (ref 0–37)
Albumin: 2.8 g/dL — ABNORMAL LOW (ref 3.5–5.2)
Alkaline Phosphatase: 126 U/L — ABNORMAL HIGH (ref 39–117)
CO2: 26 mEq/L (ref 19–32)
Creatinine, Ser: 0.94 mg/dL (ref 0.4–1.5)
GFR calc Af Amer: >60 mL/min (ref >60)
Potassium: 4.7 mEq/L (ref 3.5–5.1)
Sodium: 139 mEq/L (ref 135–145)
Total Bilirubin: 1 mg/dL (ref 0.3–1.2)
Total Protein: 6.3 g/dL (ref 6.0–8.3)

## 2011-01-17 LAB — HEPATIC FUNCTION PANEL
Alkaline Phosphatase: 182 U/L — ABNORMAL HIGH (ref 39–117)
Indirect Bilirubin: 0.5 mg/dL (ref 0.3–0.9)
Total Bilirubin: 0.7 mg/dL (ref 0.3–1.2)
Total Protein: 6.2 g/dL (ref 6.0–8.3)

## 2011-01-17 LAB — DIFFERENTIAL
Basophils Absolute: 0 10*3/uL (ref 0.0–0.1)
Basophils Relative: 0 % (ref 0–1)
Eosinophils Absolute: 0.2 10*3/uL (ref 0.0–0.7)
Eosinophils Relative: 1 % (ref 0–5)
Lymphocytes Relative: 16 % (ref 12–46)
Monocytes Absolute: 0.8 10*3/uL (ref 0.1–1.0)
Monocytes Relative: 10 % (ref 3–12)
Neutro Abs: 6.2 10*3/uL (ref 1.7–7.7)
Neutrophils Relative %: 73 % (ref 43–77)
Neutrophils Relative %: 76 % (ref 43–77)

## 2011-01-17 LAB — URINALYSIS, ROUTINE W REFLEX MICROSCOPIC
Bilirubin Urine: NEGATIVE
Ketones, ur: 15 mg/dL — AB
Nitrite: NEGATIVE
Protein, ur: NEGATIVE mg/dL
pH: 5.5 (ref 5.0–8.0)

## 2011-01-17 LAB — CULTURE, BLOOD (ROUTINE X 2)
Culture: NO GROWTH
Culture: NO GROWTH

## 2011-02-03 ENCOUNTER — Other Ambulatory Visit (INDEPENDENT_AMBULATORY_CARE_PROVIDER_SITE_OTHER): Payer: PRIVATE HEALTH INSURANCE | Admitting: Internal Medicine

## 2011-02-03 DIAGNOSIS — Z0000 Encounter for general adult medical examination without abnormal findings: Secondary | ICD-10-CM

## 2011-02-03 LAB — HEPATIC FUNCTION PANEL
ALT: 22 U/L (ref 0–53)
AST: 27 U/L (ref 0–37)
Albumin: 4 g/dL (ref 3.5–5.2)
Alkaline Phosphatase: 60 U/L (ref 39–117)
Total Protein: 6.9 g/dL (ref 6.0–8.3)

## 2011-02-03 LAB — LIPID PANEL: VLDL: 16.4 mg/dL (ref 0.0–40.0)

## 2011-02-03 LAB — POCT URINALYSIS DIPSTICK
Glucose, UA: NEGATIVE
Ketones, UA: NEGATIVE
Leukocytes, UA: NEGATIVE
Protein, UA: NEGATIVE

## 2011-02-03 LAB — CBC WITH DIFFERENTIAL/PLATELET
Basophils Relative: 0.5 % (ref 0.0–3.0)
Eosinophils Relative: 4.1 % (ref 0.0–5.0)
HCT: 41.4 % (ref 39.0–52.0)
Hemoglobin: 14.2 g/dL (ref 13.0–17.0)
Lymphocytes Relative: 29.3 % (ref 12.0–46.0)
Monocytes Relative: 8.8 % (ref 3.0–12.0)
Neutro Abs: 3.2 10*3/uL (ref 1.4–7.7)
RBC: 4.56 Mil/uL (ref 4.22–5.81)

## 2011-02-03 LAB — BASIC METABOLIC PANEL
Calcium: 9 mg/dL (ref 8.4–10.5)
GFR: 101.25 mL/min (ref >60.00)
Potassium: 4.7 mEq/L (ref 3.5–5.1)
Sodium: 143 mEq/L (ref 135–145)

## 2011-02-03 LAB — PSA: PSA: 0.76 ng/mL (ref 0.10–4.00)

## 2011-02-16 ENCOUNTER — Encounter: Payer: Self-pay | Admitting: Internal Medicine

## 2011-03-01 ENCOUNTER — Ambulatory Visit (INDEPENDENT_AMBULATORY_CARE_PROVIDER_SITE_OTHER): Payer: PRIVATE HEALTH INSURANCE | Admitting: Internal Medicine

## 2011-03-01 ENCOUNTER — Encounter: Payer: Self-pay | Admitting: Internal Medicine

## 2011-03-01 VITALS — BP 134/90 | HR 84 | Temp 98.7°F | Ht 69.0 in | Wt 252.0 lb

## 2011-03-01 DIAGNOSIS — Z23 Encounter for immunization: Secondary | ICD-10-CM

## 2011-03-01 DIAGNOSIS — Z0000 Encounter for general adult medical examination without abnormal findings: Secondary | ICD-10-CM

## 2011-03-01 DIAGNOSIS — Z2911 Encounter for prophylactic immunotherapy for respiratory syncytial virus (RSV): Secondary | ICD-10-CM

## 2011-03-01 NOTE — Progress Notes (Signed)
  Subjective:    Patient ID: Shawn Wood, male    DOB: Feb 04, 1951, 60 y.o.   MRN: [id number]  HPI cpx  Past Medical History  Diagnosis Date  . COLONIC POLYPS, HX OF 05/25/2007  . HYPERGLYCEMIA 08/07/2008  . TINEA PEDIS 07/19/2007   Past Surgical History  Procedure Date  . Varicose vein surgery   . Knee surgery 1999    fracture    reports that he has never smoked. He does not have any smokeless tobacco history on file. He reports that he does not drink alcohol. His drug history not on file. family history includes Brain cancer in his sister; Breast cancer in his mother; Prostate cancer in his father; and Seizures in his son. No Known Allergies    Review of Systems  patient denies chest pain, shortness of breath, orthopnea. Denies lower extremity edema, abdominal pain, change in appetite, change in bowel movements. Patient denies rashes, musculoskeletal complaints. No other specific complaints in a complete review of systems.      Objective:   Physical Exam Well-developed male in no acute distress. HEENT exam atraumatic, normocephalic, extraocular muscles are intact. Conjunctivae are pink without exudate. Neck is supple without lymphadenopathy, thyromegaly, jugular venous distention. Chest is clear to auscultation without increased work of breathing. Cardiac exam S1-S2 are regular. The PMI is normal. No significant murmurs or gallops. Abdominal exam active bowel sounds, soft, nontender. No abdominal bruits. Extremities no clubbing cyanosis or edema. Peripheral pulses are normal without bruits. Neurologic exam alert and oriented without any motor or sensory deficits. Rectal exam normal tone prostate normal size without masses or asymmetry.        Assessment & Plan:  Well visit. Health maint UTD Advised regular exercise, low fat diet and weight loss

## 2011-03-16 NOTE — Assessment & Plan Note (Signed)
OFFICE VISIT   Shawn Wood, Shawn Wood  DOB:  01-02-1951                                       09/20/2007  UEAVW#:[id number]   The patient presents today for continued followup of his severe venous  hypertension in his right leg. He has worn thigh high 20-30 mmHg  compression garments since my last visit with him, and reports this has  given him no relief in his pain. 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 He used to run for exercise  and is unable to do this due to the venous hypertension associated leg  pain. He also reports difficulty driving due to the pain with the  prolonged sitting with his leg down. He has worn thigh high 20-30 mmHg  compression garments for greater than three months with no improvement  in symptoms. He elevates his legs as much as possible and also takes  ibuprofen 600 mg t.i.d. for leg pain which continues to not give him  adequate relief.   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 He underwent formal Duplex today and this  reveals incompetence of his great saphenous vein in his right thigh.  There are branches off of this seemingly large nest of varicosities. I  have discussed the procedure of laser ablation of his saphenous vein  with him and have recommended this for relief of venous hypertension. I  have also recommended stab phlebectomy as treatment of varicosities for  further reduction of venous hypertension and symptom relief. He wished  to proceed with this as soon as we can assure insurance and coverage.   Larina Earthly, M.D.  Electronically Signed   TFE/MEDQ  D:  09/20/2007  T:  09/21/2007  Job:  [id number]   cc:   Valetta Mole. Swords, MD

## 2011-03-16 NOTE — Procedures (Signed)
LOWER EXTREMITY VENOUS REFLUX EXAM   INDICATION:  Right lower extremity pain, varicose veins.   EXAM:  Using color-flow imaging and pulse Doppler spectral analysis, the  right common femoral, superficial femoral, popliteal, posterior tibial,  greater and lesser saphenous veins are evaluated.  There is no evidence  suggesting deep venous insufficiency in the right lower extremity.   The right saphenofemoral junction is not competent.  The right GSV is  not competent with the caliber as described below.   The right proximal short saphenous vein demonstrates competency.   GSV Diameter (used if found to be incompetent only)                                            Right    Left  Proximal Greater Saphenous Vein           0.62 cm  cm  Proximal-to-mid-thigh                     0.54 cm  cm  Mid thigh                                 0.57 cm  cm  Mid-distal thigh                          0.57 cm  cm  Distal thigh                              0.18 cm  cm  Knee                                      0.19 cm  cm   SFJ:  0.69 cm   IMPRESSION:  1. Right greater saphenous vein reflux is identified with the caliber      ranging from 0.18 cm to 0.69 cm knee to groin.  2. The right greater saphenous vein is not aneurysmal.  3. The right greater saphenous vein is not tortuous.  4. The deep venous system is competent.  5. The right lesser saphenous vein is competent.  6. 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  7. No evidence of right lower extremity DVT.  8. The large varicose vein on the medial/posterior right thigh appears      to branch off of the greater saphenous vein in the distal thigh      after which point the greater saphenous vein becomes much smaller.      That varicose vein also appears to have incompetent perforator      connections to the superficial femoral vein, gastrocnemius vein,      and short  saphenous vein.   ___________________________________________  Larina Earthly, M.D.   AS/MEDQ  D:  09/20/2007  T:  09/21/2007  Job:  [id number]

## 2011-03-16 NOTE — Consult Note (Signed)
NEW PATIENT CONSULTATION   Shawn Wood, Shawn Wood  DOB:  January 22, 1951                                       06/21/2007  ZOXWR#:[id number]   The patient presents today for evaluation of severe right leg venous  hypertension.  He is a otherwise healthy 60 year old gentleman who has  had a many year history of progressive venous pathology.  He has had  marked varicosities in his left posterior thigh extending down into his  lateral calf.  He has had a treatment at an outlying vein clinic 6-7  years ago with attempt at sclerotherapy of these extensive large  varicosities.  Needless to say this was unsuccessful and he has had  progressive difficulty since that time.  He does have some skin changes  over the veins of his lateral calf and also skin changes at his medial  knee.  He does not have any venous problems on his left leg.  He does  have a prior trauma to his left knee.  He reports a pain specifically  over the varicosities most particularly in his posterior thigh and  lateral calf.  He reports this is causing a great deal of discomfort at  the end of the day and also swelling at the end of the day in his right  calf.  He does not have any history of deep vein thrombosis or bleeding  from his varicosities.   PAST HISTORY:  Negative for hypertension, diabetes, or other major  medical difficulties.   MEDICATIONS:  1. Paxil 20 mg daily.  2. Aspirin 81 mg daily.  3. Vitamin C.   He is married, does not smoke or drink alcohol on a regular basis.   PHYSICAL EXAMINATION:  General:  Well-developed, well-nourished, white  male appearing stated age 72.  Vital signs:  His blood pressure is  159/96, pulse 67, respirations 18.  His dorsalis pedis pulses are 2 plus  bilaterally.  He does have marked tributary varicosities over his medial  posterior thigh, posterior popliteal space extending into his right  lateral calf.  He also has varicosities over his medial malleolus.   He  underwent hand held duplex by me showing gross reflux in saphenous  vein which is feeding these large varicosities.   I discussed options with the patient.  He is not worn graduated  compression stockings.  We have fitted him with thigh high graduated  compression stockings today.  He will continue elevation when possible  but this is difficult due to his job and also use ibuprofen for pain  which he has been doing.  We will see him in 3 months with formal duplex  evaluation at that time to determine if conservative treatment is  effective.  I did discuss the option of laser ablation and stab  phlebectomy with the patient.  I explained that this is an option should  he fail conservative measures.  We will see him again in 3 months for  continued followup.   Larina Earthly, M.D.  Electronically Signed   TFE/MEDQ  D:  06/21/2007  T:  06/22/2007  Job:  [id number]   cc:   Valetta Mole. Swords, MD

## 2011-03-16 NOTE — Assessment & Plan Note (Signed)
OFFICE VISIT   Shawn Wood, Shawn Wood  DOB:  1951/09/20                                       12/20/2007  ZOXWR#:[id number]   The patient presents today for followup of his laser ablation of right  greater saphenous vein and stab phlebectomies of tributary varicosities  on 11/01/2007.  He has done well and has completely resolved all  bruising.  He reports that his legs feel much better, and he is not  noting the swelling he had preprocedurally.  He does have some tributary  varicosities in the posterior calf.  He reports that these are not  causing him any discomfort.  He is concerned regarding the appearance of  these.  I explained the options for treatment of this would be a stab  phlebectomy or sclerotherapy.  I explained that stab phlebectomy would  be more quick resolution.  I explained that, since these are cosmetic  concern only, this would not be covered by his insurance carrier.  He  will consider whether he desires any further treatment of this and will  notify us should he wish to proceed.  Otherwise, he will see [place] on a  p.r.n. basis.   Larina Earthly, M.D.  Electronically Signed   TFE/MEDQ  D:  12/20/2007  T:  12/21/2007  Job:  [id number]   cc:   Valetta Mole. Swords, MD

## 2011-03-16 NOTE — Assessment & Plan Note (Signed)
OFFICE VISIT   DECLYN, DELSOL  DOB:  1951/05/30                                       11/08/2007  ZOXWR#:[id number]   Patient presents today for a one-week followup of laser ablation and  stab phlebectomy, right leg.  He does have his usual amount of bruising  and mild-to-moderate discomfort.   His ultrasound today reveals occlusion of his saphenous vein just below  his saphenofemoral junction throughout his thigh and wide patency of his  common femoral vein with no evidence of injury or thrombus.   I am quite pleased with his initial result and plan to see him again in  six weeks for final followup.   Larina Earthly, M.D.  Electronically Signed   TFE/MEDQ  D:  11/08/2007  T:  11/09/2007  Job:  [id number]

## 2011-04-22 ENCOUNTER — Ambulatory Visit: Payer: PRIVATE HEALTH INSURANCE | Admitting: Internal Medicine

## 2011-04-28 ENCOUNTER — Other Ambulatory Visit: Payer: Self-pay | Admitting: Internal Medicine

## 2011-06-03 ENCOUNTER — Encounter: Payer: Self-pay | Admitting: Vascular Surgery

## 2011-06-09 ENCOUNTER — Ambulatory Visit (INDEPENDENT_AMBULATORY_CARE_PROVIDER_SITE_OTHER): Payer: PRIVATE HEALTH INSURANCE | Admitting: Vascular Surgery

## 2011-06-09 ENCOUNTER — Encounter: Payer: Self-pay | Admitting: Vascular Surgery

## 2011-06-09 ENCOUNTER — Encounter (INDEPENDENT_AMBULATORY_CARE_PROVIDER_SITE_OTHER): Payer: PRIVATE HEALTH INSURANCE

## 2011-06-09 VITALS — BP 141/84 | HR 71 | Resp 16 | Ht 69.0 in | Wt 235.0 lb

## 2011-06-09 DIAGNOSIS — I83893 Varicose veins of bilateral lower extremities with other complications: Secondary | ICD-10-CM

## 2011-06-09 DIAGNOSIS — M79609 Pain in unspecified limb: Secondary | ICD-10-CM

## 2011-06-09 NOTE — Progress Notes (Signed)
Subjective:     Patient ID: Shawn Wood, male   DOB: 10/31/1951, 60 y.o.   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 He is well known to me from laser ablation of his right great saphenous vein and stab phlebectomy's in 2008. These cause him pain with prolonged standing and also when he is sitting. They lie on his posterior thigh and therefore cause discomfort riding in a car and sitting for prolonged periods. He has no bleeding no history of DVT.  Review of Systems  positive for pain in his legs with walking. Otherwise review of systems negative.    Past Medical History  Diagnosis Date  . COLONIC POLYPS, HX OF 05/25/2007  . HYPERGLYCEMIA 08/07/2008  . TINEA PEDIS 07/19/2007  . Leg pain right leg    History  Substance Use Topics  . Smoking status: Never Smoker   . Smokeless tobacco: Not on file  . Alcohol Use: No    Family History  Problem Relation Age of Onset  . Breast cancer Mother   . Prostate cancer Father   . Brain cancer Sister   . Seizures Son     No Known Allergies  Current outpatient prescriptions:aspirin 81 MG tablet, Take 81 mg by mouth daily.  , Disp: , Rfl: ;  PARoxetine (PAXIL) 20 MG tablet, TAKE ONE TABLET BY MOUTH ONE TIME DAILY, Disp: 90 tablet, Rfl: 0;  Ascorbic Acid (VITAMIN C) 500 MG tablet, Take 500 mg by mouth daily.  , Disp: , Rfl: ;  Cholecalciferol (VITAMIN D) 1000 UNITS capsule, Take 1,000 Units by mouth daily.  , Disp: , Rfl:   Filed Vitals:   06/09/11 1242  Height: 5\' 9"  (1.753 m)  Weight: 235 lb (106.595 kg)    Body mass index is 34.70 kg/(m^2).       Objective:   Physical Exam Well-developed well-nourished white male in no acute distress. 2+ radial and 2+ dorsalis pedis pulses. He does have significant tributary varicosities in the posterior thigh extending around to his lateral knee and calf. He has no tissue loss.  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 He is a candidate for stab phlebectomy of these painful varicosities should he fail conservative treatment. We will see him again in 3 months for continued discussion.

## 2011-06-09 NOTE — Progress Notes (Signed)
VV consult with duplex prior.  C/o right leg pain.  11/01/2007 laser ablation R GSV and stab phlebectomy right leg 10-20 by Dr. Arbie Cookey.

## 2011-06-15 ENCOUNTER — Encounter: Payer: Self-pay | Admitting: Family Medicine

## 2011-06-15 ENCOUNTER — Ambulatory Visit (INDEPENDENT_AMBULATORY_CARE_PROVIDER_SITE_OTHER): Payer: PRIVATE HEALTH INSURANCE | Admitting: Family Medicine

## 2011-06-15 VITALS — BP 130/84 | Temp 99.2°F | Wt 250.0 lb

## 2011-06-15 DIAGNOSIS — R1011 Right upper quadrant pain: Secondary | ICD-10-CM

## 2011-06-15 DIAGNOSIS — R109 Unspecified abdominal pain: Secondary | ICD-10-CM

## 2011-06-15 LAB — CBC WITH DIFFERENTIAL/PLATELET
Basophils Absolute: 0 10*3/uL (ref 0.0–0.1)
Basophils Relative: 0.6 % (ref 0.0–3.0)
Eosinophils Absolute: 0.2 10*3/uL (ref 0.0–0.7)
Lymphocytes Relative: 25.5 % (ref 12.0–46.0)
MCHC: 33.6 g/dL (ref 30.0–36.0)
Neutrophils Relative %: 63.6 % (ref 43.0–77.0)
RBC: 4.79 Mil/uL (ref 4.22–5.81)
RDW: 13.6 % (ref 11.5–14.6)

## 2011-06-15 LAB — POCT URINALYSIS DIPSTICK
Bilirubin, UA: NEGATIVE
Blood, UA: NEGATIVE
Glucose, UA: NEGATIVE
Spec Grav, UA: 1.02

## 2011-06-15 LAB — HEPATIC FUNCTION PANEL
Alkaline Phosphatase: 65 U/L (ref 39–117)
Bilirubin, Direct: 0 mg/dL (ref 0.0–0.3)

## 2011-06-15 NOTE — Progress Notes (Signed)
  Subjective:    Patient ID: Shawn Wood, male    DOB: Feb 25, 1951, 60 y.o.   MRN: [id number]  HPI Poorly localized right flank pain for 2-3 weeks duration. Pain comes and goes. Moderate severity. Occasional nausea, no vomiting. Pain is actually somewhat right lower lumbar area but seems to radiate anteriorly up under the rib cage and right upper abdomen region. No relation to eating. Denies fever, appetite change, weight change, or any change in bowel habits. Possibly worse with activity. No chest pain. Not waking with pain. No alleviating factors. Patient had colonoscopy recently which was unremarkable. Denies urinary symptoms.  Past Medical History  Diagnosis Date  . COLONIC POLYPS, HX OF 05/25/2007  . HYPERGLYCEMIA 08/07/2008  . TINEA PEDIS 07/19/2007  . Leg pain right leg   Past Surgical History  Procedure Date  . Varicose vein surgery   . Knee surgery 1999    fracture    reports that he has never smoked. He does not have any smokeless tobacco history on file. He reports that he does not drink alcohol or use illicit drugs. family history includes Brain cancer in his sister; Breast cancer in his mother; Prostate cancer in his father; and Seizures in his son. No Known Allergies    Review of Systems  Constitutional: Negative for fever, chills, appetite change, fatigue and unexpected weight change.  HENT: Negative for sore throat and trouble swallowing.   Respiratory: Negative for cough and shortness of breath.   Cardiovascular: Negative for chest pain.  Gastrointestinal: Positive for abdominal pain. Negative for nausea, vomiting, diarrhea, constipation, blood in stool and abdominal distention.  Genitourinary: Negative for dysuria and hematuria.  Skin: Negative for rash.  Neurological: Negative for dizziness.  Hematological: Negative for adenopathy. Does not bruise/bleed easily.       Objective:   Physical Exam  Constitutional: He is oriented to person, place, and time. He  appears well-developed and well-nourished. No distress.  HENT:  Mouth/Throat: Oropharynx is clear and moist.  Neck: Neck supple. No thyromegaly present.  Cardiovascular: Normal rate, regular rhythm and normal heart sounds.   Pulmonary/Chest: Effort normal and breath sounds normal. No respiratory distress. He has no wheezes. He has no rales.  Abdominal: Soft. Bowel sounds are normal. He exhibits no distension and no mass. There is no tenderness. There is no rebound and no guarding.  Musculoskeletal: He exhibits no edema.  Lymphadenopathy:    He has no cervical adenopathy.  Neurological: He is alert and oriented to person, place, and time.  Skin: No rash noted.          Assessment & Plan:  Poorly localized right flank and upper abdominal pain. Doubt gallstones. Distribution would be unusual for musculoskeletal etiology. Check labs with CBC, hepatic panel, and urine dipstick. Schedule abdominal ultrasound

## 2011-06-15 NOTE — Patient Instructions (Signed)
Follow up promptly for any fever, vomiting, or any worsening pain.

## 2011-06-18 ENCOUNTER — Ambulatory Visit
Admission: RE | Admit: 2011-06-18 | Discharge: 2011-06-18 | Disposition: A | Discharge status: Home / Self Care | Payer: PRIVATE HEALTH INSURANCE | Source: Ambulatory Visit | Attending: Family Medicine | Admitting: Family Medicine

## 2011-06-18 DIAGNOSIS — R109 Unspecified abdominal pain: Secondary | ICD-10-CM

## 2011-06-18 DIAGNOSIS — R1011 Right upper quadrant pain: Secondary | ICD-10-CM

## 2011-06-20 ENCOUNTER — Other Ambulatory Visit: Payer: Self-pay | Admitting: Internal Medicine

## 2011-06-21 NOTE — Procedures (Unsigned)
LOWER EXTREMITY VENOUS REFLUX EXAM  INDICATION:  Previous ablation of right great saphenous vein in 2008, now with increased pain.  EXAM:  Using color-flow imaging and pulse Doppler spectral analysis, the right common femoral, superficial femoral, popliteal, posterior tibial, greater and lesser saphenous veins are evaluated.  There is evidence suggesting deep venous insufficiency in the right lower extremity.  The right saphenofemoral junction is not competent with reflux of >567milliseconds. The right GSV is not competent with reflux of >557milliseconds with the caliber as described below.  The right proximal short saphenous vein demonstrates competency.  GSV Diameter (used if found to be incompetent only)                                           Right    Left Proximal Greater Saphenous Vein           0.90 cm  cm Proximal-to-mid-thigh                     0.3 cm   cm Mid thigh                                 0.16 cm  cm Mid-distal thigh                          cm       cm Distal thigh                              0.23 cm  cm Knee                                      0.23 cm  cm  IMPRESSION: 1. Right greater saphenous vein is not competent with reflux     >560milliseconds. 2. The right greater saphenous vein is tortuous in the mid thigh. 3. The deep venous system is not competent with reflux of     >562milliseconds. 4. The right small saphenous vein is competent.  ___________________________________________ Larina Earthly, M.D.  LT/MEDQ  D:  06/09/2011  T:  06/09/2011  Job:  [id number]

## 2011-06-21 NOTE — Progress Notes (Signed)
Quick Note:  Pt informed on cell personally identified VM ______ 

## 2011-06-22 NOTE — Progress Notes (Signed)
Quick Note:  [place] is back, was it not routed to you? It was probably routed to Dr Cato Mulligan, pt PCP ______

## 2011-06-23 NOTE — Progress Notes (Signed)
Quick Note:  Ptr informed on personally identified VM ______

## 2011-06-24 ENCOUNTER — Telehealth: Payer: Self-pay | Admitting: *Deleted

## 2011-06-24 NOTE — Telephone Encounter (Signed)
Mr. Leger is requesting that his 3 month VV follow up and stab phlebectomy procedure be moved up because his insurance changes in October and he has a high deductible which has been met.  I explained to Mr. Cloe that as his initial visit was on 08-/06-2011 we had to wait the full three months before bringing him in for the 3 month visit and then go through the pre-certification process and then schedule the procedure.  I explained that if he did not complete the 3 months of conservative treatment first, his insurance company would deny the procedure and would not pay the claim.  Mr. Welz verbalized understanding.

## 2011-08-16 ENCOUNTER — Ambulatory Visit (INDEPENDENT_AMBULATORY_CARE_PROVIDER_SITE_OTHER): Payer: BC Managed Care – PPO

## 2011-08-16 DIAGNOSIS — Z23 Encounter for immunization: Secondary | ICD-10-CM

## 2011-09-13 ENCOUNTER — Encounter: Payer: Self-pay | Admitting: Vascular Surgery

## 2011-09-14 ENCOUNTER — Ambulatory Visit (INDEPENDENT_AMBULATORY_CARE_PROVIDER_SITE_OTHER): Payer: BC Managed Care – PPO | Admitting: Vascular Surgery

## 2011-09-14 ENCOUNTER — Encounter: Payer: Self-pay | Admitting: Vascular Surgery

## 2011-09-14 VITALS — BP 144/86 | HR 71 | Resp 18 | Ht 69.0 in | Wt 248.0 lb

## 2011-09-14 DIAGNOSIS — I83893 Varicose veins of bilateral lower extremities with other complications: Secondary | ICD-10-CM

## 2011-09-14 NOTE — Progress Notes (Signed)
Problems with Activities of Daily Living Secondary to Leg Pain  1. Shawn Wood  States that car travel is very difficult and painful for him due to leg pain.  2. 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  3. Shawn Wood states that walking for exercise is very difficult due to leg pain.    Rankin, Neena Rhymes   Failure of  Conservative Therapy:  1. Worn 20-30 mm Hg thigh high compression hose >3 months with no relief of symptoms.  2. Frequently elevates legs-no relief of symptoms  3. Taken Ibuprofen 600 Mg TID with no relief of symptoms.  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 This is despite use of compression and elevation when possible. This is most painful with prolonged driving and riding in a car. He'll he has clearly failed conservative treatment. Physical exam and exam is unchanged. 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 I readmitted his right leg with the SonoSite ultrasound. This shows that he has had successful ablation of the near entirety of his right great saphenous vein from his prior treatment years ago. He does have one small segment near the saphenofemoral junction over approximately 5-7 cm which is patent. This is not associated with the tributary varicosities in his posterior thigh with no communication. I do not feel that he requires treatment of his small remaining segment of patent great saphenous vein near the saphenofemoral junction since it is not related to the tributary varicosities are causing him pain. I have recommended stab phlebectomy of the tributary varicosities for pain relief. He understands and wishes to proceed as soon as possible.  Past Medical History  Diagnosis Date  . COLONIC POLYPS, HX OF 05/25/2007  . HYPERGLYCEMIA 08/07/2008  . TINEA PEDIS  07/19/2007  . Leg pain right leg  . Varicose veins     history of cellulitis    History  Substance Use Topics  . Smoking status: Never Smoker   . Smokeless tobacco: Not on file  . Alcohol Use: Yes     occcassinal beer    Family History  Problem Relation Age of Onset  . Breast cancer Mother   . Prostate cancer Father   . Brain cancer Sister   . Seizures Son     No Known Allergies  Current outpatient prescriptions:Ascorbic Acid (VITAMIN C) 500 MG tablet, Take 500 mg by mouth daily.  , Disp: , Rfl: ;  aspirin 81 MG tablet, Take 81 mg by mouth daily.  , Disp: , Rfl: ;  PARoxetine (PAXIL) 20 MG tablet, TAKE ONE TABLET BY MOUTH ONE TIME DAILY, Disp: 90 tablet, Rfl: 1  BP 144/86  Pulse 71  Resp 18  Ht 5\' 9"  (1.753 m)  Wt 248 lb (112.492 kg)  BMI 36.62 kg/m2  Body mass index is 36.62 kg/(m^2).        Impression and plan. 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 Recommend right leg and stab phlebectomy.

## 2011-10-12 ENCOUNTER — Telehealth: Payer: Self-pay | Admitting: *Deleted

## 2011-10-12 NOTE — Telephone Encounter (Signed)
Left detailed voice message informing Mr. Hlavacek that [employer] [place] denied CPT [phone] (stab phlebectomy) after nurse and medical review of clinical documentation.  Informed Mr. Chuong that Dr. Arbie Cookey had peer-to-peer review with [facility] on October 07, 2011 in an attempt to overturn the denial.  Despite Dr. Bosie Helper efforts to appeal and overturn the denial, [employer] [place] has refused to overturn their decision and the denial is their final decision.

## 2011-10-13 ENCOUNTER — Ambulatory Visit (INDEPENDENT_AMBULATORY_CARE_PROVIDER_SITE_OTHER): Payer: BC Managed Care – PPO | Admitting: Family Medicine

## 2011-10-13 ENCOUNTER — Encounter: Payer: Self-pay | Admitting: Family Medicine

## 2011-10-13 VITALS — BP 134/82 | HR 97 | Temp 99.1°F | Wt 250.0 lb

## 2011-10-13 DIAGNOSIS — L259 Unspecified contact dermatitis, unspecified cause: Secondary | ICD-10-CM

## 2011-10-13 DIAGNOSIS — L299 Pruritus, unspecified: Secondary | ICD-10-CM

## 2011-10-13 MED ORDER — METHYLPREDNISOLONE ACETATE 80 MG/ML IJ SUSP
80.0000 mg | Freq: Once | INTRAMUSCULAR | Status: AC
  Administered 2011-10-13: 80 mg via INTRAMUSCULAR

## 2011-10-13 MED ORDER — CLOTRIMAZOLE-BETAMETHASONE 1-0.05 % EX CREA: TOPICAL_CREAM | CUTANEOUS | Status: AC

## 2011-10-13 NOTE — Progress Notes (Signed)
  Subjective:    Patient ID: Shawn Wood, male    DOB: 06-20-1951, 60 y.o.   MRN: [id number]  HPI A 60 year old white male, nonsmoker in with complaints of a rash on his left lower leg. Patient believes that he came from wearing a pair of socks that had not wanted them. 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 He has been trying Gold Bond powder and baby powder home with no relief. Describes the rash as red and itchy. Denies swelling.   Review of Systems As stated above Past Medical History  Diagnosis Date  . COLONIC POLYPS, HX OF 05/25/2007  . HYPERGLYCEMIA 08/07/2008  . TINEA PEDIS 07/19/2007  . Leg pain right leg  . Varicose veins     history of cellulitis    History   Social History  . Marital Status: Married    Spouse Name: N/A    Number of Children: N/A  . Years of Education: N/A   Occupational History  . Not on file.   Social History Main Topics  . Smoking status: Never Smoker   . Smokeless tobacco: Not on file  . Alcohol Use: Yes     occcassinal beer  . Drug Use: No  . Sexually Active:    Other Topics Concern  . Not on file   Social History Narrative  . No narrative on file    Past Surgical History  Procedure Date  . Varicose vein surgery   . Knee surgery 1999    fracture    Family History  Problem Relation Age of Onset  . Breast cancer Mother   . Prostate cancer Father   . Brain cancer Sister   . Seizures Son     No Known Allergies  Current Outpatient Prescriptions on File Prior to Visit  Medication Sig Dispense Refill  . Ascorbic Acid (VITAMIN C) 500 MG tablet Take 500 mg by mouth daily.        Marland Kitchen aspirin 81 MG tablet Take 81 mg by mouth daily.        Marland Kitchen PARoxetine (PAXIL) 20 MG tablet TAKE ONE TABLET BY MOUTH ONE TIME DAILY  90 tablet  1   No current facility-administered medications on file prior to visit.    BP 134/82  Pulse 97  Temp(Src) 99.1 F (37.3 C) (Oral)  Wt 250 lb (113.399 kg)chart    Objective:   Physical Exam Alert and  oriented in no acute distress. Lungs clear. Heart: Regular rate and rhythm. Skin: Left lower extremity shows a papular, red, excoriated rash extending midcalf to the ankle. No drainage or discharge from the lesions.       Assessment & Plan:  Assessment: Contact dermatitis  Plan: Depo-Medrol 80 mg IM x1. Lotrisone cream applied to the affected area twice a day until symptoms improve. Call the office if symptoms worsen or persists return when necessary and as scheduled

## 2011-10-13 NOTE — Patient Instructions (Signed)
Contact Dermatitis Contact dermatitis is a reaction to certain substances that touch the skin. Contact dermatitis can be either irritant contact dermatitis or allergic contact dermatitis. Irritant contact dermatitis does not require previous exposure to the substance for a reaction to occur. Allergic contact dermatitis only occurs if you have been exposed to the substance before. Upon a repeat exposure, your body reacts to the substance.   CAUSES   Many substances can cause contact dermatitis. Irritant dermatitis is most commonly caused by repeated exposure to mildly irritating substances, such as:  Makeup.     Soaps.    Detergents.    Bleaches.    Acids.    Metal salts, such as nickel.  Allergic contact dermatitis is most commonly caused by exposure to:  Poisonous plants.     Chemicals (deodorants, shampoos).     Jewelry.    Latex.    Neomycin in triple antibiotic cream.     Preservatives in products, including clothing.  SYMPTOMS   The area of skin that is exposed may develop:  Dryness or flaking.     Redness.    Cracks.    Itching.    Pain or a burning sensation.     Blisters.  With allergic contact dermatitis, there may also be swelling in areas such as the eyelids, mouth, or genitals.   DIAGNOSIS   Your caregiver can usually tell what the problem is by doing a physical exam. In cases where the cause is uncertain and an allergic contact dermatitis is suspected, a patch skin test may be performed to help determine the cause of your dermatitis. TREATMENT Treatment includes protecting the skin from further contact with the irritating substance by avoiding that substance if possible. Barrier creams, powders, and gloves may be helpful. Your caregiver may also recommend:  Steroid creams or ointments applied 2 times daily. For best results, soak the rash area in cool water for 20 minutes. Then apply the medicine. Cover the area with a plastic wrap. You can store the  steroid cream in the refrigerator for a "chilly" effect on your rash. That may decrease itching. Oral steroid medicines may be needed in more severe cases.     Antibiotics or antibacterial ointments if a skin infection is present.     Antihistamine lotion or an antihistamine taken by mouth to ease itching.     Lubricants to keep moisture in your skin.     Burow's solution to reduce redness and soreness or to dry a weeping rash. Mix one packet or tablet of solution in 2 cups cool water. Dip a clean washcloth in the mixture, wring it out a bit, and put it on the affected area. Leave the cloth in place for 30 minutes. Do this as often as possible throughout the day.     Taking several cornstarch or baking soda baths daily if the area is too large to cover with a washcloth.  Harsh chemicals, such as alkalis or acids, can cause skin damage that is like a burn. You should flush your skin for 15 to 20 minutes with cold water after such an exposure. You should also seek immediate medical care after exposure. Bandages (dressings), antibiotics, and pain medicine may be needed for severely irritated skin.   HOME CARE INSTRUCTIONS  Avoid the substance that caused your reaction.     Keep the area of skin that is affected away from hot water, soap, sunlight, chemicals, acidic substances, or anything else that would irritate your skin.       Do not scratch the rash. Scratching may cause the rash to become infected.     You may take cool baths to help stop the itching.     Only take over-the-counter or prescription medicines as directed by your caregiver.     See your caregiver for follow-up care as directed to make sure your skin is healing properly.  SEEK MEDICAL CARE IF:    Your condition is not better after 3 days of treatment.     You seem to be getting worse.     You see signs of infection such as swelling, tenderness, redness, soreness, or warmth in the affected area.     You have any problems  related to your medicines.  Document Released: 10/15/2000 Document Revised: 06/30/2011 Document Reviewed: 03/23/2011 [employer] Patient Information 2012 [employer]. 

## 2011-11-10 ENCOUNTER — Encounter: Payer: Self-pay | Admitting: Vascular Surgery

## 2011-12-03 ENCOUNTER — Other Ambulatory Visit: Payer: Self-pay | Admitting: Internal Medicine

## 2012-06-14 ENCOUNTER — Other Ambulatory Visit: Payer: Self-pay | Admitting: Internal Medicine

## 2012-08-04 ENCOUNTER — Ambulatory Visit: Payer: BC Managed Care – PPO

## 2012-08-16 ENCOUNTER — Ambulatory Visit: Payer: BC Managed Care – PPO

## 2012-08-18 ENCOUNTER — Ambulatory Visit: Payer: BC Managed Care – PPO

## 2012-08-22 ENCOUNTER — Ambulatory Visit (INDEPENDENT_AMBULATORY_CARE_PROVIDER_SITE_OTHER): Payer: BC Managed Care – PPO

## 2012-08-22 DIAGNOSIS — Z23 Encounter for immunization: Secondary | ICD-10-CM

## 2012-09-15 ENCOUNTER — Other Ambulatory Visit: Payer: Self-pay | Admitting: Internal Medicine

## 2012-11-06 ENCOUNTER — Other Ambulatory Visit: Payer: Self-pay | Admitting: Internal Medicine

## 2012-12-09 ENCOUNTER — Encounter (HOSPITAL_COMMUNITY): Payer: Self-pay

## 2012-12-09 DIAGNOSIS — Y939 Activity, unspecified: Secondary | ICD-10-CM | POA: Insufficient documentation

## 2012-12-09 DIAGNOSIS — S51809A Unspecified open wound of unspecified forearm, initial encounter: Secondary | ICD-10-CM | POA: Insufficient documentation

## 2012-12-09 DIAGNOSIS — Z8739 Personal history of other diseases of the musculoskeletal system and connective tissue: Secondary | ICD-10-CM | POA: Insufficient documentation

## 2012-12-09 DIAGNOSIS — Z79899 Other long term (current) drug therapy: Secondary | ICD-10-CM | POA: Insufficient documentation

## 2012-12-09 DIAGNOSIS — Z860100 Personal history of colon polyps, unspecified: Secondary | ICD-10-CM | POA: Insufficient documentation

## 2012-12-09 DIAGNOSIS — Z8619 Personal history of other infectious and parasitic diseases: Secondary | ICD-10-CM | POA: Insufficient documentation

## 2012-12-09 DIAGNOSIS — Z7982 Long term (current) use of aspirin: Secondary | ICD-10-CM | POA: Insufficient documentation

## 2012-12-09 DIAGNOSIS — Y929 Unspecified place or not applicable: Secondary | ICD-10-CM | POA: Insufficient documentation

## 2012-12-09 DIAGNOSIS — Z8679 Personal history of other diseases of the circulatory system: Secondary | ICD-10-CM | POA: Insufficient documentation

## 2012-12-09 DIAGNOSIS — W540XXA Bitten by dog, initial encounter: Secondary | ICD-10-CM | POA: Insufficient documentation

## 2012-12-09 NOTE — ED Notes (Signed)
Patient presents with c/o left forearm laceration after being accidentally scratched by his dog. Wound covered and bleeding controlled PTA. Patient takes ASA 81 mg daily. Unsure of last tetanus.

## 2012-12-10 ENCOUNTER — Emergency Department (HOSPITAL_COMMUNITY)
Admission: EM | Admit: 2012-12-10 | Discharge: 2012-12-10 | Disposition: A | Discharge status: Home / Self Care | Payer: BC Managed Care – PPO | Attending: Emergency Medicine | Admitting: Emergency Medicine

## 2012-12-10 DIAGNOSIS — S51812A Laceration without foreign body of left forearm, initial encounter: Secondary | ICD-10-CM

## 2012-12-10 MED ORDER — TETANUS-DIPHTH-ACELL PERTUSSIS 5-2.5-18.5 LF-MCG/0.5 IM SUSP
0.5000 mL | Freq: Once | INTRAMUSCULAR | Status: AC
  Administered 2012-12-10: 0.5 mL via INTRAMUSCULAR
  Filled 2012-12-10: qty 0.5

## 2012-12-10 MED ORDER — AMOXICILLIN-POT CLAVULANATE 875-125 MG PO TABS: 1.0000 | ORAL_TABLET | Freq: Two times a day (BID) | ORAL | Status: DC

## 2012-12-10 NOTE — ED Provider Notes (Signed)
Medical screening examination/treatment/procedure(s) were performed by non-physician practitioner and as supervising physician I was immediately available for consultation/collaboration.  Flint Melter, MD 12/10/12 [phone]

## 2012-12-10 NOTE — ED Notes (Signed)
Provider at bedside to suture large lac.

## 2012-12-10 NOTE — ED Notes (Signed)
Suture cart at bedside 

## 2012-12-10 NOTE — ED Provider Notes (Signed)
History     CSN: [id number]  Arrival date & time 12/09/12  2333   None     Chief Complaint  Patient presents with  . Extremity Laceration    (Consider location/radiation/quality/duration/timing/severity/associated sxs/prior treatment) HPI History provided by pt.   Patient was bitten by his dog on left forearm today and sustained 2 puncture wounds and a laceration.  Has minimal pain currently and bleeding is controlled.  No associated paresthesias.  His tetanus is not up to date.  Rabies vaccination is up to date.  Past Medical History  Diagnosis Date  . COLONIC POLYPS, HX OF 05/25/2007  . HYPERGLYCEMIA 08/07/2008  . TINEA PEDIS 07/19/2007  . Leg pain right leg  . Varicose veins     history of cellulitis    Past Surgical History  Procedure Laterality Date  . Varicose vein surgery    . Knee surgery  1999    fracture    Family History  Problem Relation Age of Onset  . Breast cancer Mother   . Prostate cancer Father   . Brain cancer Sister   . Seizures Son     History  Substance Use Topics  . Smoking status: Never Smoker   . Smokeless tobacco: Never Used  . Alcohol Use: Yes     Comment: occcassinal beer      Review of Systems  All other systems reviewed and are negative.    Allergies  Review of patient's allergies indicates no known allergies.  Home Medications   Current Outpatient Rx  Name  Route  Sig  Dispense  Refill  . amoxicillin-clavulanate (AUGMENTIN) 875-125 MG per tablet   Oral   Take 1 tablet by mouth 2 (two) times daily.   14 tablet   0   . Ascorbic Acid (VITAMIN C) 500 MG tablet   Oral   Take 500 mg by mouth daily.           Marland Kitchen aspirin 81 MG tablet   Oral   Take 81 mg by mouth daily.           Marland Kitchen PARoxetine (PAXIL) 20 MG tablet      TAKE ONE TABLET BY MOUTH ONE TIME DAILY   90 tablet   1     BP 157/87  Pulse 88  Temp(Src) 97.7 F (36.5 C) (Oral)  SpO2 95%  Physical Exam  Nursing note and vitals  reviewed. Constitutional: He is oriented to person, place, and time. He appears well-developed and well-nourished. No distress.  HENT:  Head: Normocephalic and atraumatic.  Eyes:  Normal appearance  Neck: Normal range of motion.  Pulmonary/Chest: Effort normal.  Musculoskeletal: Normal range of motion.  3cm subq, oozing lac across flexor surface of left mid-forearm.  Surrounding ecchymosis.  2 subq puncture wounds on opposite surface.  Full active ROM of wrist and fingers, 2+ radial pulse and distal sensation intact.   Neurological: He is alert and oriented to person, place, and time.  Psychiatric: He has a normal mood and affect. His behavior is normal.    ED Course  Procedures (including critical care time)  LACERATION REPAIR Performed by: Otilio Miu Authorized by: Ruby Cola E Consent: Verbal consent obtained. Risks and benefits: risks, benefits and alternatives were discussed Consent given by: patient Patient identity confirmed: provided demographic data Prepped and Draped in normal sterile fashion Wound explored  Laceration Location: L forearm Laceration Length: 3cm  No Foreign Bodies seen or palpated  Anesthesia: local infiltration  Local anesthetic: lidocaine  2% w/ epinephrine  Anesthetic total: 4 ml  Irrigation method: syringe Amount of cleaning: standard  Skin closure: prolene 4.0  Number of sutures: 2  Technique: loose simple interrupted  Patient tolerance: Patient tolerated the procedure well with no immediate complications.   Labs Reviewed - No data to display No results found.   1. Laceration of left forearm       MDM  62yo M presents w/ dog bite to L forearm.  Cleaned extensively w/ NS irrigation by nursing staff.  I closed loosely w/ 2 sutures because gaping and bleeding.  Tetanus updated and pt d/c'd home w/ augmentin.  Strict return precautions discussed. 2:17 AM        Otilio Miu, PA-C 12/10/12  [phone]

## 2012-12-10 NOTE — ED Notes (Signed)
Provider at bedside

## 2013-02-13 ENCOUNTER — Other Ambulatory Visit: Payer: Self-pay | Admitting: Internal Medicine

## 2013-02-19 ENCOUNTER — Telehealth: Payer: Self-pay | Admitting: Internal Medicine

## 2013-02-19 MED ORDER — PAROXETINE HCL 20 MG PO TABS: ORAL_TABLET | ORAL | Status: DC

## 2013-02-19 NOTE — Telephone Encounter (Signed)
PT wife is requesting a refill of pt's PARoxetine (PAXIL) 20 MG tablet, he has a CPX scheduled for 6/18, but will be out prior to that. Please assist.

## 2013-02-19 NOTE — Telephone Encounter (Signed)
rx sent in electronically 

## 2013-03-03 ENCOUNTER — Emergency Department (HOSPITAL_COMMUNITY): Payer: BC Managed Care – PPO

## 2013-03-03 ENCOUNTER — Observation Stay (HOSPITAL_COMMUNITY)
Admission: EM | Admit: 2013-03-03 | Discharge: 2013-03-04 | DRG: 254 | Disposition: A | Discharge status: Home / Self Care | Payer: BC Managed Care – PPO | Attending: Surgery | Admitting: Surgery

## 2013-03-03 ENCOUNTER — Encounter (HOSPITAL_COMMUNITY): Payer: Self-pay | Admitting: Emergency Medicine

## 2013-03-03 DIAGNOSIS — S42199A Fracture of other part of scapula, unspecified shoulder, initial encounter for closed fracture: Principal | ICD-10-CM | POA: Insufficient documentation

## 2013-03-03 DIAGNOSIS — Y93H2 Activity, gardening and landscaping: Secondary | ICD-10-CM | POA: Insufficient documentation

## 2013-03-03 DIAGNOSIS — Y92009 Unspecified place in unspecified non-institutional (private) residence as the place of occurrence of the external cause: Secondary | ICD-10-CM | POA: Insufficient documentation

## 2013-03-03 DIAGNOSIS — Z79899 Other long term (current) drug therapy: Secondary | ICD-10-CM | POA: Insufficient documentation

## 2013-03-03 DIAGNOSIS — Y998 Other external cause status: Secondary | ICD-10-CM | POA: Insufficient documentation

## 2013-03-03 DIAGNOSIS — S2249XA Multiple fractures of ribs, unspecified side, initial encounter for closed fracture: Secondary | ICD-10-CM

## 2013-03-03 DIAGNOSIS — H53149 Visual discomfort, unspecified: Secondary | ICD-10-CM | POA: Insufficient documentation

## 2013-03-03 DIAGNOSIS — R51 Headache: Secondary | ICD-10-CM | POA: Insufficient documentation

## 2013-03-03 DIAGNOSIS — S2241XA Multiple fractures of ribs, right side, initial encounter for closed fracture: Secondary | ICD-10-CM

## 2013-03-03 DIAGNOSIS — S42101A Fracture of unspecified part of scapula, right shoulder, initial encounter for closed fracture: Secondary | ICD-10-CM | POA: Insufficient documentation

## 2013-03-03 DIAGNOSIS — S42109A Fracture of unspecified part of scapula, unspecified shoulder, initial encounter for closed fracture: Secondary | ICD-10-CM

## 2013-03-03 DIAGNOSIS — W309XXA Contact with unspecified agricultural machinery, initial encounter: Secondary | ICD-10-CM | POA: Insufficient documentation

## 2013-03-03 DIAGNOSIS — S22009A Unspecified fracture of unspecified thoracic vertebra, initial encounter for closed fracture: Secondary | ICD-10-CM | POA: Insufficient documentation

## 2013-03-03 LAB — URINALYSIS, ROUTINE W REFLEX MICROSCOPIC
Bilirubin Urine: NEGATIVE
Glucose, UA: NEGATIVE mg/dL
Hgb urine dipstick: NEGATIVE
Protein, ur: NEGATIVE mg/dL
Specific Gravity, Urine: 1.039 — ABNORMAL HIGH (ref 1.005–1.030)

## 2013-03-03 LAB — POCT I-STAT, CHEM 8
BUN: 23 mg/dL (ref 6–23)
Creatinine, Ser: 0.9 mg/dL (ref 0.50–1.35)
Glucose, Bld: 155 mg/dL — ABNORMAL HIGH (ref 70–99)
Sodium: 140 mEq/L (ref 135–145)
TCO2: 22 mmol/L (ref 0–100)

## 2013-03-03 LAB — CBC
HCT: 40.5 % (ref 39.0–52.0)
MCH: 30.2 pg (ref 26.0–34.0)
MCV: 86.2 fL (ref 78.0–100.0)
RBC: 4.7 MIL/uL (ref 4.22–5.81)
WBC: 10.3 10*3/uL (ref 4.0–10.5)

## 2013-03-03 MED ORDER — OXYCODONE HCL 5 MG PO TABS
5.0000 mg | ORAL_TABLET | ORAL | Status: DC | PRN
  Administered 2013-03-04 (×2): 5 mg via ORAL
  Filled 2013-03-03 (×2): qty 1

## 2013-03-03 MED ORDER — FENTANYL CITRATE 0.05 MG/ML IJ SOLN
50.0000 ug | Freq: Once | INTRAMUSCULAR | Status: AC
  Administered 2013-03-03: 50 ug via INTRAVENOUS
  Filled 2013-03-03: qty 2

## 2013-03-03 MED ORDER — PAROXETINE HCL 20 MG PO TABS
20.0000 mg | ORAL_TABLET | Freq: Every day | ORAL | Status: DC
  Administered 2013-03-04: 20 mg via ORAL
  Filled 2013-03-03 (×2): qty 1

## 2013-03-03 MED ORDER — HYDROMORPHONE HCL PF 1 MG/ML IJ SOLN
1.0000 mg | Freq: Once | INTRAMUSCULAR | Status: AC
  Administered 2013-03-03: 1 mg via INTRAVENOUS
  Filled 2013-03-03: qty 1

## 2013-03-03 MED ORDER — SODIUM CHLORIDE 0.9 % IV BOLUS (SEPSIS)
1000.0000 mL | Freq: Once | INTRAVENOUS | Status: AC
  Administered 2013-03-03: 1000 mL via INTRAVENOUS

## 2013-03-03 MED ORDER — HYDROMORPHONE HCL PF 1 MG/ML IJ SOLN
1.0000 mg | INTRAMUSCULAR | Status: DC | PRN
  Administered 2013-03-03 – 2013-03-04 (×4): 1 mg via INTRAVENOUS
  Filled 2013-03-03 (×4): qty 1

## 2013-03-03 MED ORDER — IOHEXOL 300 MG/ML  SOLN
100.0000 mL | Freq: Once | INTRAMUSCULAR | Status: AC | PRN
  Administered 2013-03-03: 100 mL via INTRAVENOUS

## 2013-03-03 MED ORDER — SODIUM CHLORIDE 0.9 % IV SOLN
INTRAVENOUS | Status: DC
  Administered 2013-03-03: 22:00:00 via INTRAVENOUS

## 2013-03-03 MED ORDER — DOCUSATE SODIUM 100 MG PO CAPS
100.0000 mg | ORAL_CAPSULE | Freq: Two times a day (BID) | ORAL | Status: DC
  Administered 2013-03-03 – 2013-03-04 (×2): 100 mg via ORAL
  Filled 2013-03-03 (×2): qty 1

## 2013-03-03 MED ORDER — ENOXAPARIN SODIUM 40 MG/0.4ML ~~LOC~~ SOLN
40.0000 mg | SUBCUTANEOUS | Status: DC
  Administered 2013-03-03: 40 mg via SUBCUTANEOUS
  Filled 2013-03-03 (×2): qty 0.4

## 2013-03-03 MED ORDER — ACETAMINOPHEN 325 MG PO TABS: 650.0000 mg | ORAL_TABLET | ORAL | Status: DC | PRN

## 2013-03-03 MED ORDER — ONDANSETRON HCL 4 MG PO TABS
4.0000 mg | ORAL_TABLET | Freq: Four times a day (QID) | ORAL | Status: DC | PRN
  Administered 2013-03-04: 4 mg via ORAL
  Filled 2013-03-03: qty 1

## 2013-03-03 MED ORDER — ONDANSETRON HCL 4 MG/2ML IJ SOLN
4.0000 mg | Freq: Once | INTRAMUSCULAR | Status: AC
  Administered 2013-03-03: 4 mg via INTRAVENOUS
  Filled 2013-03-03: qty 2

## 2013-03-03 MED ORDER — ONDANSETRON HCL 4 MG/2ML IJ SOLN
4.0000 mg | Freq: Four times a day (QID) | INTRAMUSCULAR | Status: DC | PRN
  Filled 2013-03-03: qty 2

## 2013-03-03 NOTE — Progress Notes (Signed)
Orthopedic Tech Progress Note Patient Details:  Shawn Wood 12-14-1950 [id number]  Ortho Devices Type of Ortho Device: Arm sling Ortho Device/Splint Location: RUE Ortho Device/Splint Interventions: Ordered;Application   Jennye Moccasin 03/03/2013, 9:30 PM

## 2013-03-03 NOTE — ED Notes (Signed)
Pt fell off riding lawn mover this am.  Landed on R shoulder.  Pt complaining of R shoulder pain and R side rib pain.  Pt denies LOC, has abrasion on back of head, no bleeding noted.  EMS started IV gave fentanyl.

## 2013-03-03 NOTE — H&P (Signed)
Shawn Wood is an 62 y.o. male.   Chief Complaint: Fall off lawn tractor HPI: 62 yo male - was mowing grass on an incline, when the tractor flipped backwards.  He managed to avoid having the tractor land on him, but he fell on his right shoulder.  He is having severe right shoulder and chest pain.  No LOC.  No other complaints.  Brought to [facility] by POV  Past Medical History  Diagnosis Date  . COLONIC POLYPS, HX OF 05/25/2007  . HYPERGLYCEMIA 08/07/2008  . TINEA PEDIS 07/19/2007  . Leg pain right leg  . Varicose veins     history of cellulitis    Past Surgical History  Procedure Laterality Date  . Varicose vein surgery    . Knee surgery  1999    fracture  This knee fracture was also caused by an accident involving a Surveyor, mining.  Dr. Ophelia Charter  Family History  Problem Relation Age of Onset  . Breast cancer Mother   . Prostate cancer Father   . Brain cancer Sister   . Seizures Son    Social History:  reports that he has never smoked. He has never used smokeless tobacco. He reports that  drinks alcohol. He reports that he does not use illicit drugs.  Allergies: No Known Allergies  Prior to Admission medications   Medication Sig Start Date End Date Taking? Authorizing Provider  Ascorbic Acid (VITAMIN C) 500 MG tablet Take 500 mg by mouth every evening.    Yes Historical Provider, MD  aspirin EC 81 MG tablet Take 81 mg by mouth every evening.    Yes Historical Provider, MD  PARoxetine (PAXIL) 20 MG tablet Take 20 mg by mouth daily after breakfast.   Yes Historical Provider, MD     Results for orders placed during the hospital encounter of 03/03/13 (from the past 48 hour(s))  POCT I-STAT, CHEM 8     Status: Abnormal   Collection Time    03/03/13 12:11 PM      Result Value Range   Sodium 140  135 - 145 mEq/L   Potassium 3.7  3.5 - 5.1 mEq/L   Chloride 105  96 - 112 mEq/L   BUN 23  6 - 23 mg/dL   Creatinine, Ser 1.61  0.50 - 1.35 mg/dL   Glucose, Bld 096 (*) 70 - 99 mg/dL   Calcium,  Ion 0.45 (*) 1.13 - 1.30 mmol/L   TCO2 22  0 - 100 mmol/L   Hemoglobin 15.3  13.0 - 17.0 g/dL   HCT 40.9  81.1 - 91.4 %  URINALYSIS, ROUTINE W REFLEX MICROSCOPIC     Status: Abnormal   Collection Time    03/03/13  2:50 PM      Result Value Range   Color, Urine YELLOW  YELLOW   APPearance CLEAR  CLEAR   Specific Gravity, Urine 1.039 (*) 1.005 - 1.030   pH 7.0  5.0 - 8.0   Glucose, UA NEGATIVE  NEGATIVE mg/dL   Hgb urine dipstick NEGATIVE  NEGATIVE   Bilirubin Urine NEGATIVE  NEGATIVE   Ketones, ur 15 (*) NEGATIVE mg/dL   Protein, ur NEGATIVE  NEGATIVE mg/dL   Urobilinogen, UA 1.0  0.0 - 1.0 mg/dL   Nitrite NEGATIVE  NEGATIVE   Leukocytes, UA NEGATIVE  NEGATIVE   Comment: MICROSCOPIC NOT DONE ON URINES WITH NEGATIVE PROTEIN, BLOOD, LEUKOCYTES, NITRITE, OR GLUCOSE <1000 mg/dL.   Dg Ribs Unilateral W/chest Right  03/03/2013  **ADDENDUM** CREATED:  03/03/2013 12:06:28  There is a displaced fracture of the right scapula.  **END ADDENDUM** SIGNED BY: Natasha Mead, M.D.   03/03/2013  *RADIOLOGY REPORT*  Clinical Data: Fall, shoulder injury, rib injury  RIGHT RIBS AND CHEST - 3+ VIEW  Comparison: 11/08/2009  Findings: Five views right ribs submitted.  Minimal displaced fracture right 7th rib.  No diagnostic pneumothorax.  IMPRESSION: Minimal displaced fracture right 7th rib.  No diagnostic pneumothorax.   Original Report Authenticated By: Natasha Mead, M.D.    Dg Shoulder Right  03/03/2013  *RADIOLOGY REPORT*  Clinical Data: Fall, shoulder pain  RIGHT SHOULDER - 2+ VIEW  Comparison: None.  Findings: Three views right shoulder submitted.  There is a displaced fracture of the right scapula.  IMPRESSION: Displaced fracture of the right scapula.   Original Report Authenticated By: Natasha Mead, M.D.    Ct Chest W Contrast  03/03/2013  *RADIOLOGY REPORT*  Clinical Data:  Larey Seat off riding lawn mower and complains of shoulder and rib pain.  CT CHEST, ABDOMEN AND PELVIS WITH CONTRAST  Technique:  Multidetector CT  imaging of the chest, abdomen and pelvis was performed following the standard protocol during bolus administration of intravenous contrast.  Contrast: OMNIPAQUE IOHEXOL 300 MG/ML  SOLN  Comparison:   None.  CT CHEST  Findings:  There is no evidence for chest lymphadenopathy.  No evidence for pericardial or pleural fluid.  Normal appearance of the aortic arch and mediastinum.  No evidence for a mediastinal hematoma.  The trachea and mainstem bronchi are patent without pneumothorax. There is a focal thickening along the right minor fissure on sequence four, image 33 which is likely an incidental finding. There is mild basilar atelectasis.  Comminuted fractures of the right scapula.  There is no evidence for intra-articular involvement.  The right shoulder is located. There is a mildly displaced fracture of the anterior right seventh rib.  There may be a nondisplaced fracture of the lateral right eighth rib.  Mildly displaced fracture of the posterior right ninth rib and question a subtle fracture of the posterior right eighth rib.  IMPRESSION: Comminuted right scapula fracture and multiple right rib fractures.  Negative for a pneumothorax.  CT ABDOMEN AND PELVIS  Findings:  No evidence for free intraperitoneal air.  1.3 cm low density structure in the left hepatic lobe is nonspecific but could represent a cyst.  Otherwise, there is a normal appearance of the liver, spleen, pancreas, gallbladder and adrenal glands.  There is a 7 mm nonobstructive stone in the left kidney.  2.2 cm round low density structure in the right kidney lower pole is suggestive for a cyst. There appears to be an adjacent smaller right renal cyst. No gross abnormality to the prostate, seminal vesicles and urinary bladder.  No significant free fluid or lymphadenopathy.  There are colonic diverticula without acute inflammatory changes.  Normal appearance of the appendix.  Both hips are located.  IMPRESSION: No acute abnormality within the  abdomen or pelvis.  Nonobstructive left kidney stone, measuring 7 mm.   Original Report Authenticated By: Richarda Overlie, M.D.    Ct Cervical Spine Wo Contrast  03/03/2013  *RADIOLOGY REPORT*  Clinical Data: Trauma post fall  CT CERVICAL SPINE WITHOUT CONTRAST  Technique:  Multidetector CT imaging of the cervical spine was performed. Multiplanar CT image reconstructions were also generated.  Comparison: None.  Findings: Axial images of the cervical spine shows no acute fracture or subluxation.  There is no pneumothorax in visualized lung apices.  Computer processed images shows disc space flattening with anterior and posterior spurring at C3-C4 C5-C6 and C 67 level. No prevertebral soft tissue swelling.  Cervical airway is patent. Coronal images shows no acute fracture or subluxation.  IMPRESSION: No acute fracture or subluxation.  Degenerative changes as described above.   Original Report Authenticated By: Natasha Mead, M.D.    Ct Abdomen Pelvis W Contrast  03/03/2013  *RADIOLOGY REPORT*  Clinical Data:  Larey Seat off riding lawn mower and complains of shoulder and rib pain.  CT CHEST, ABDOMEN AND PELVIS WITH CONTRAST  Technique:  Multidetector CT imaging of the chest, abdomen and pelvis was performed following the standard protocol during bolus administration of intravenous contrast.  Contrast: OMNIPAQUE IOHEXOL 300 MG/ML  SOLN  Comparison:   None.  CT CHEST  Findings:  There is no evidence for chest lymphadenopathy.  No evidence for pericardial or pleural fluid.  Normal appearance of the aortic arch and mediastinum.  No evidence for a mediastinal hematoma.  The trachea and mainstem bronchi are patent without pneumothorax. There is a focal thickening along the right minor fissure on sequence four, image 33 which is likely an incidental finding. There is mild basilar atelectasis.  Comminuted fractures of the right scapula.  There is no evidence for intra-articular involvement.  The right shoulder is located. There is  a mildly displaced fracture of the anterior right seventh rib.  There may be a nondisplaced fracture of the lateral right eighth rib.  Mildly displaced fracture of the posterior right ninth rib and question a subtle fracture of the posterior right eighth rib.  IMPRESSION: Comminuted right scapula fracture and multiple right rib fractures.  Negative for a pneumothorax.  CT ABDOMEN AND PELVIS  Findings:  No evidence for free intraperitoneal air.  1.3 cm low density structure in the left hepatic lobe is nonspecific but could represent a cyst.  Otherwise, there is a normal appearance of the liver, spleen, pancreas, gallbladder and adrenal glands.  There is a 7 mm nonobstructive stone in the left kidney.  2.2 cm round low density structure in the right kidney lower pole is suggestive for a cyst. There appears to be an adjacent smaller right renal cyst. No gross abnormality to the prostate, seminal vesicles and urinary bladder.  No significant free fluid or lymphadenopathy.  There are colonic diverticula without acute inflammatory changes.  Normal appearance of the appendix.  Both hips are located.  IMPRESSION: No acute abnormality within the abdomen or pelvis.  Nonobstructive left kidney stone, measuring 7 mm.   Original Report Authenticated By: Richarda Overlie, M.D.     Review of Systems  Eyes: Negative.   Cardiovascular: Positive for chest pain.  Musculoskeletal: Positive for back pain and joint pain.  Neurological: Negative.   Endo/Heme/Allergies: Negative.     Blood pressure 115/62, pulse 73, temperature 97.8 F (36.6 C), temperature source Oral, resp. rate 16, SpO2 94.00%. Physical Exam  WDWN in NAD HEENT - EOMI, PERRL Slight abrasion on back of head Neck - non-tender; FROM Right shoulder - very tender posteriorly Right chest - tender posterolateral Lungs - CTA B CV - RRR Abd - + BS, soft, NT Extremities - lower extremities with no sign of trauma/ FROM left upper extremity   Assessment/Plan Fall  from lawn tractor Right comminuted scapula fracture - not intra-articular Right rib fractures 6-8  Transfer to [facility] - Trauma service Pain management - oxygen; incentive spirometer Consult Ortho - Dr. Otelia Sergeant re: scapula fracture Right arm sling  Sherion Dooly K. 03/03/2013, 4:49 PM

## 2013-03-03 NOTE — Consult Note (Signed)
Reason for Consult:Right closed scapula body fracture. Referring Physician: Dr. Margaree Mackintosh Consulting Physician:NITKA,JAMES E  Orthopedic Diagnosis:1) Closed comminuted extraarticular scapula body fracture, mildly displaced. 2) Multiple Right rib fractures.  UJW:JXBJ H Titsworth is an 62 y.o. male.History of previous left proximal tibia fracture treated by Dr. Ophelia Charter years ago. Brought a new riding mower 30 inch cut and was using the mower this afternoon mowing up a hill or incline when The front of the mower lifted up and rolled over on him. He was able to reach and turn off the mower key as he was Seated then the mower landed on him. Taken by ambulance to [facility] and evaluated. Dr. Rosalia Hammers contacted me and I recommended a sling for a displace right scapula fracture. Patient was seen by Dr. Margaree Mackintosh trauma and due to need For pain control and findings of multiple right rib fractures Mr. Dorvil was transported and admitted to [facility]. No  Numbness or associated right upper extremity injury. Pain is right scapula and right ribs posteriorly.   Past Medical History  Diagnosis Date  . COLONIC POLYPS, HX OF 05/25/2007  . HYPERGLYCEMIA 08/07/2008  . TINEA PEDIS 07/19/2007  . Leg pain right leg  . Varicose veins     history of cellulitis    Past Surgical History  Procedure Laterality Date  . Varicose vein surgery    . Knee surgery  1999    fracture    Family History  Problem Relation Age of Onset  . Breast cancer Mother   . Prostate cancer Father   . Brain cancer Sister   . Seizures Son     Social History:  reports that he has never smoked. He has never used smokeless tobacco. He reports that  drinks alcohol. He reports that he does not use illicit drugs.  Allergies: No Known Allergies  Medications:  Prior to Admission:  Prescriptions prior to admission  Medication Sig Dispense Refill  . Ascorbic Acid (VITAMIN C) 500 MG tablet Take 500 mg by mouth every evening.       Marland Kitchen aspirin EC 81 MG tablet Take  81 mg by mouth every evening.       Marland Kitchen PARoxetine (PAXIL) 20 MG tablet Take 20 mg by mouth daily after breakfast.        Results for orders placed during the hospital encounter of 03/03/13 (from the past 48 hour(s))  POCT I-STAT, CHEM 8     Status: Abnormal   Collection Time    03/03/13 12:11 PM      Result Value Range   Sodium 140  135 - 145 mEq/L   Potassium 3.7  3.5 - 5.1 mEq/L   Chloride 105  96 - 112 mEq/L   BUN 23  6 - 23 mg/dL   Creatinine, Ser 4.78  0.50 - 1.35 mg/dL   Glucose, Bld 295 (*) 70 - 99 mg/dL   Calcium, Ion 6.21 (*) 1.13 - 1.30 mmol/L   TCO2 22  0 - 100 mmol/L   Hemoglobin 15.3  13.0 - 17.0 g/dL   HCT 30.8  65.7 - 84.6 %  URINALYSIS, ROUTINE W REFLEX MICROSCOPIC     Status: Abnormal   Collection Time    03/03/13  2:50 PM      Result Value Range   Color, Urine YELLOW  YELLOW   APPearance CLEAR  CLEAR   Specific Gravity, Urine 1.039 (*) 1.005 - 1.030   pH 7.0  5.0 - 8.0   Glucose, UA NEGATIVE  NEGATIVE mg/dL  Hgb urine dipstick NEGATIVE  NEGATIVE   Bilirubin Urine NEGATIVE  NEGATIVE   Ketones, ur 15 (*) NEGATIVE mg/dL   Protein, ur NEGATIVE  NEGATIVE mg/dL   Urobilinogen, UA 1.0  0.0 - 1.0 mg/dL   Nitrite NEGATIVE  NEGATIVE   Leukocytes, UA NEGATIVE  NEGATIVE   Comment: MICROSCOPIC NOT DONE ON URINES WITH NEGATIVE PROTEIN, BLOOD, LEUKOCYTES, NITRITE, OR GLUCOSE <1000 mg/dL.  CBC     Status: None   Collection Time    03/03/13  6:26 PM      Result Value Range   WBC 10.3  4.0 - 10.5 K/uL   RBC 4.70  4.22 - 5.81 MIL/uL   Hemoglobin 14.2  13.0 - 17.0 g/dL   HCT 21.3  08.6 - 57.8 %   MCV 86.2  78.0 - 100.0 fL   MCH 30.2  26.0 - 34.0 pg   MCHC 35.1  30.0 - 36.0 g/dL   RDW 46.9  62.9 - 52.8 %   Platelets 218  150 - 400 K/uL    Dg Ribs Unilateral W/chest Right  03/03/2013  **ADDENDUM** CREATED: 03/03/2013 12:06:28  There is a displaced fracture of the right scapula.  **END ADDENDUM** SIGNED BY: Natasha Mead, M.D.   03/03/2013  *RADIOLOGY REPORT*  Clinical Data:  Fall, shoulder injury, rib injury  RIGHT RIBS AND CHEST - 3+ VIEW  Comparison: 11/08/2009  Findings: Five views right ribs submitted.  Minimal displaced fracture right 7th rib.  No diagnostic pneumothorax.  IMPRESSION: Minimal displaced fracture right 7th rib.  No diagnostic pneumothorax.   Original Report Authenticated By: Natasha Mead, M.D.    Dg Shoulder Right  03/03/2013  *RADIOLOGY REPORT*  Clinical Data: Fall, shoulder pain  RIGHT SHOULDER - 2+ VIEW  Comparison: None.  Findings: Three views right shoulder submitted.  There is a displaced fracture of the right scapula.  IMPRESSION: Displaced fracture of the right scapula.   Original Report Authenticated By: Natasha Mead, M.D.    Ct Chest W Contrast  03/03/2013  *RADIOLOGY REPORT*  Clinical Data:  Larey Seat off riding lawn mower and complains of shoulder and rib pain.  CT CHEST, ABDOMEN AND PELVIS WITH CONTRAST  Technique:  Multidetector CT imaging of the chest, abdomen and pelvis was performed following the standard protocol during bolus administration of intravenous contrast.  Contrast: OMNIPAQUE IOHEXOL 300 MG/ML  SOLN  Comparison:   None.  CT CHEST  Findings:  There is no evidence for chest lymphadenopathy.  No evidence for pericardial or pleural fluid.  Normal appearance of the aortic arch and mediastinum.  No evidence for a mediastinal hematoma.  The trachea and mainstem bronchi are patent without pneumothorax. There is a focal thickening along the right minor fissure on sequence four, image 33 which is likely an incidental finding. There is mild basilar atelectasis.  Comminuted fractures of the right scapula.  There is no evidence for intra-articular involvement.  The right shoulder is located. There is a mildly displaced fracture of the anterior right seventh rib.  There may be a nondisplaced fracture of the lateral right eighth rib.  Mildly displaced fracture of the posterior right ninth rib and question a subtle fracture of the posterior right eighth  rib.  IMPRESSION: Comminuted right scapula fracture and multiple right rib fractures.  Negative for a pneumothorax.  CT ABDOMEN AND PELVIS  Findings:  No evidence for free intraperitoneal air.  1.3 cm low density structure in the left hepatic lobe is nonspecific but could represent a cyst.  Otherwise, there is a normal appearance of the liver, spleen, pancreas, gallbladder and adrenal glands.  There is a 7 mm nonobstructive stone in the left kidney.  2.2 cm round low density structure in the right kidney lower pole is suggestive for a cyst. There appears to be an adjacent smaller right renal cyst. No gross abnormality to the prostate, seminal vesicles and urinary bladder.  No significant free fluid or lymphadenopathy.  There are colonic diverticula without acute inflammatory changes.  Normal appearance of the appendix.  Both hips are located.  IMPRESSION: No acute abnormality within the abdomen or pelvis.  Nonobstructive left kidney stone, measuring 7 mm.   Original Report Authenticated By: Richarda Overlie, M.D.    Ct Cervical Spine Wo Contrast  03/03/2013  *RADIOLOGY REPORT*  Clinical Data: Trauma post fall  CT CERVICAL SPINE WITHOUT CONTRAST  Technique:  Multidetector CT imaging of the cervical spine was performed. Multiplanar CT image reconstructions were also generated.  Comparison: None.  Findings: Axial images of the cervical spine shows no acute fracture or subluxation.  There is no pneumothorax in visualized lung apices.  Computer processed images shows disc space flattening with anterior and posterior spurring at C3-C4 C5-C6 and C 67 level. No prevertebral soft tissue swelling.  Cervical airway is patent. Coronal images shows no acute fracture or subluxation.  IMPRESSION: No acute fracture or subluxation.  Degenerative changes as described above.   Original Report Authenticated By: Natasha Mead, M.D.    Ct Abdomen Pelvis W Contrast  03/03/2013  *RADIOLOGY REPORT*  Clinical Data:  Larey Seat off riding lawn mower and  complains of shoulder and rib pain.  CT CHEST, ABDOMEN AND PELVIS WITH CONTRAST  Technique:  Multidetector CT imaging of the chest, abdomen and pelvis was performed following the standard protocol during bolus administration of intravenous contrast.  Contrast: OMNIPAQUE IOHEXOL 300 MG/ML  SOLN  Comparison:   None.  CT CHEST  Findings:  There is no evidence for chest lymphadenopathy.  No evidence for pericardial or pleural fluid.  Normal appearance of the aortic arch and mediastinum.  No evidence for a mediastinal hematoma.  The trachea and mainstem bronchi are patent without pneumothorax. There is a focal thickening along the right minor fissure on sequence four, image 33 which is likely an incidental finding. There is mild basilar atelectasis.  Comminuted fractures of the right scapula.  There is no evidence for intra-articular involvement.  The right shoulder is located. There is a mildly displaced fracture of the anterior right seventh rib.  There may be a nondisplaced fracture of the lateral right eighth rib.  Mildly displaced fracture of the posterior right ninth rib and question a subtle fracture of the posterior right eighth rib.  IMPRESSION: Comminuted right scapula fracture and multiple right rib fractures.  Negative for a pneumothorax.  CT ABDOMEN AND PELVIS  Findings:  No evidence for free intraperitoneal air.  1.3 cm low density structure in the left hepatic lobe is nonspecific but could represent a cyst.  Otherwise, there is a normal appearance of the liver, spleen, pancreas, gallbladder and adrenal glands.  There is a 7 mm nonobstructive stone in the left kidney.  2.2 cm round low density structure in the right kidney lower pole is suggestive for a cyst. There appears to be an adjacent smaller right renal cyst. No gross abnormality to the prostate, seminal vesicles and urinary bladder.  No significant free fluid or lymphadenopathy.  There are colonic diverticula without acute inflammatory  changes.  Normal  appearance of the appendix.  Both hips are located.  IMPRESSION: No acute abnormality within the abdomen or pelvis.  Nonobstructive left kidney stone, measuring 7 mm.   Original Report Authenticated By: Richarda Overlie, M.D.     @ROS @ Blood pressure 119/76, pulse 64, temperature 97.8 F (36.6 C), temperature source Oral, resp. rate 18, height 5\' 9"  (1.753 m), weight 108.863 kg (240 lb), SpO2 95.00%. @PHYSEXAMBYAGE2 @  Orthopaedic Exam:Awake, alert and Ox4, pleasant 62 year old male appears younger than age. Moderately obese in no acute distress. Right arm is held at his right side with the right forearm across chest. He feels more comfortable with the arm held this way and uses the left hand to assist maintaining this position. Tender right posterior scapula and right posterior ribs. right scapula and A-C joint non tender. Sternum and sternoclavicular joints are nontender,The right UE is neurovascularly normal with normal radial pulse and normal motor in radial, median, axillary and ulna nerve distribution.Lower extremities, pelvis and spine are nonpainful without deformity or tenderness. Xrays as above right comminuted mildly displaced scapula body fracture. Right D7,D8 and D9 rib fractures.  Assessment/Plan: Right closed mildly displaced comminuted scapula body fracture, extraarticular. Right multiple rib fractures. Old left proximal tibia lateral plateau fx healed.  Plan: Sling right arm. Ice packs to the scapula and right ribs posteriorly. He does not wish to have a rib belt due to previous experience with a rib belt use to treat a previous fib fx. PO narcotic pain meds. In one to two weeks start PROM, pendulum exercises right arm. Will follow while in hospital.  NITKA,JAMES E 03/03/2013, 8:06 PM

## 2013-03-03 NOTE — ED Provider Notes (Signed)
History     CSN: [id number]  Arrival date & time 03/03/13  1111   First MD Initiated Contact with Patient 03/03/13 1116      Chief Complaint  Patient presents with  . Shoulder Injury  . Rib Injury    (Consider location/radiation/quality/duration/timing/severity/associated sxs/prior treatment) HPI  Patient fell of riding lawn mower just pte.  States landed on right shoulder and has severe pain right shoulder with some right anterior chest wall/rib pain.  Abraded head, no loc.  Unable to get up due to pain.  No dyspnea or abdominal pain.  Wife heard him call out but did not witness fall.  Brought to ed by private vehicle.    Past Medical History  Diagnosis Date  . COLONIC POLYPS, HX OF 05/25/2007  . HYPERGLYCEMIA 08/07/2008  . TINEA PEDIS 07/19/2007  . Leg pain right leg  . Varicose veins     history of cellulitis    Past Surgical History  Procedure Laterality Date  . Varicose vein surgery    . Knee surgery  1999    fracture    Family History  Problem Relation Age of Onset  . Breast cancer Mother   . Prostate cancer Father   . Brain cancer Sister   . Seizures Son     History  Substance Use Topics  . Smoking status: Never Smoker   . Smokeless tobacco: Never Used  . Alcohol Use: Yes     Comment: occcassinal beer      Review of Systems  Allergies  Review of patient's allergies indicates no known allergies.  Home Medications   Current Outpatient Rx  Name  Route  Sig  Dispense  Refill  . Ascorbic Acid (VITAMIN C) 500 MG tablet   Oral   Take 500 mg by mouth every evening.          Marland Kitchen aspirin EC 81 MG tablet   Oral   Take 81 mg by mouth every evening.          Marland Kitchen PARoxetine (PAXIL) 20 MG tablet   Oral   Take 20 mg by mouth daily after breakfast.           BP 100/76  Pulse 70  Temp(Src) 97.8 F (36.6 C) (Oral)  Resp 20  SpO2 97%  Physical Exam  Nursing note and vitals reviewed. Constitutional: He is oriented to person, place, and time. He  appears well-developed and well-nourished.  HENT:  Head: Normocephalic and atraumatic.  Right Ear: External ear normal.  Left Ear: External ear normal.  Nose: Nose normal.  Mouth/Throat: Oropharynx is clear and moist.  Eyes: Conjunctivae and EOM are normal. Pupils are equal, round, and reactive to light.  Neck: Normal range of motion. Neck supple.  Musculoskeletal:  Right shoulder deformity, no clavicle ttp, elbow normal, radial pulse intact and sensation and motor intact right hand and wrist.    Neurological: He is alert and oriented to person, place, and time.  Skin: Skin is warm and dry.  Psychiatric: He has a normal mood and affect. His behavior is normal. Thought content normal.    ED Course  Procedures (including critical care time)  Labs Reviewed - No data to display No results found.   No diagnosis found.    Dg Ribs Unilateral W/chest Right  03/03/2013  **ADDENDUM** CREATED: 03/03/2013 12:06:28  There is a displaced fracture of the right scapula.  **END ADDENDUM** SIGNED BY: Natasha Mead, M.D.   03/03/2013  *RADIOLOGY REPORT*  Clinical  Data: Fall, shoulder injury, rib injury  RIGHT RIBS AND CHEST - 3+ VIEW  Comparison: 11/08/2009  Findings: Five views right ribs submitted.  Minimal displaced fracture right 7th rib.  No diagnostic pneumothorax.  IMPRESSION: Minimal displaced fracture right 7th rib.  No diagnostic pneumothorax.   Original Report Authenticated By: Natasha Mead, M.D.    Dg Shoulder Right  03/03/2013  *RADIOLOGY REPORT*  Clinical Data: Fall, shoulder pain  RIGHT SHOULDER - 2+ VIEW  Comparison: None.  Findings: Three views right shoulder submitted.  There is a displaced fracture of the right scapula.  IMPRESSION: Displaced fracture of the right scapula.   Original Report Authenticated By: Natasha Mead, M.D.     I have personally reviewed the above and right scapula fracture seen.   Discussed with Dr. Corliss Skains and plan transfer to [facility] ED for trauma admission. I have discussed  this with the patient and his wife. The patient remained hemodynamically stable. He is requiring frequent IV pain medication. I also discussed the patient's care with Dr.  Otelia Sergeant.  Dr Otelia Sergeant states that the scapula fracture should be treated with immobilization.     Hilario Quarry, MD 03/03/13 [phone]

## 2013-03-03 NOTE — ED Notes (Signed)
EMS arrived to take pt to [address] [place], [address] [place] notified

## 2013-03-03 NOTE — ED Notes (Signed)
[id number]<AV> Expected date:03/03/13<BR> Expected time:11:04 AM<BR> Means of arrival:<BR> Comments:<BR> Shoulder injury

## 2013-03-03 NOTE — ED Notes (Signed)
MD at bedside. 

## 2013-03-04 ENCOUNTER — Inpatient Hospital Stay (HOSPITAL_COMMUNITY): Payer: BC Managed Care – PPO

## 2013-03-04 DIAGNOSIS — S22000A Wedge compression fracture of unspecified thoracic vertebra, initial encounter for closed fracture: Secondary | ICD-10-CM | POA: Insufficient documentation

## 2013-03-04 DIAGNOSIS — S2249XA Multiple fractures of ribs, unspecified side, initial encounter for closed fracture: Secondary | ICD-10-CM | POA: Insufficient documentation

## 2013-03-04 LAB — CBC
HCT: 39.2 % (ref 39.0–52.0)
Hemoglobin: 13.4 g/dL (ref 13.0–17.0)
MCV: 87.3 fL (ref 78.0–100.0)
Platelets: 209 10*3/uL (ref 150–400)
RDW: 13.3 % (ref 11.5–15.5)

## 2013-03-04 LAB — BASIC METABOLIC PANEL
CO2: 26 mEq/L (ref 19–32)
Chloride: 102 mEq/L (ref 96–112)
GFR calc Af Amer: >90 mL/min (ref >90)
Potassium: 4 mEq/L (ref 3.5–5.1)
Sodium: 135 mEq/L (ref 135–145)

## 2013-03-04 MED ORDER — DSS 100 MG PO CAPS: 100.0000 mg | ORAL_CAPSULE | Freq: Two times a day (BID) | ORAL | Status: DC

## 2013-03-04 MED ORDER — OXYCODONE HCL 5 MG PO TABS: 5.0000 mg | ORAL_TABLET | ORAL | Status: DC | PRN

## 2013-03-04 NOTE — Discharge Summary (Signed)
Physician Discharge Summary  Patient ID: Shawn Wood MRN: [id number] DOB/AGE: 05-20-1951 62 y.o.  Admit date: 03/03/2013 Discharge date: 03/04/2013  Admission Diagnoses:  Discharge Diagnoses:  Active Problems:   * No active hospital problems. *   Discharged Condition: good  Hospital Course: Admitted after accident resulting in scapular fracture and multiple rib fractures.  Doing well today.  All scans today are negative for significant injuries.  Consults: orthopedic surgery  Significant Diagnostic Studies: Standard trauma labs and X-rays and CTs  Treatments: IV hydration and analgesia: Dilaudid and oxycodone  Discharge Exam: Blood pressure 124/68, pulse 70, temperature 98 F (36.7 C), temperature source Oral, resp. rate 18, height 5\' 9"  (1.753 m), weight 108.863 kg (240 lb), SpO2 97.00%. General appearance: alert, cooperative, no distress and mildly obese Chest wall: right sided chest wall tenderness, No crepitanceno crepitance GI: soft, non-tender; bowel sounds normal; no masses,  no organomegaly Extremities: Right shoulder in sling  Disposition: 01-Home or Self Care      Discharge Orders   Future Appointments Provider Department Dept Phone   04/18/2013 9:15 AM [facility] [place] [phone]   04/25/2013 9:15 AM Lindley Magnus, MD [facility] [place] [phone]   Future Orders Complete By Expires     Call MD for:  difficulty breathing, headache or visual disturbances  As directed     Call MD for:  extreme fatigue  As directed     Call MD for:  hives  As directed     Call MD for:  persistant dizziness or light-headedness  As directed     Call MD for:  persistant nausea and vomiting  As directed     Call MD for:  severe uncontrolled pain  As directed     Diet general  As directed     Increase activity slowly  As directed     Lifting restrictions  As directed     Comments:      No lifting with left arm until cleared by orthopedic  surgeon    May shower / Bathe  As directed         Medication List    TAKE these medications       aspirin EC 81 MG tablet  Take 81 mg by mouth every evening.     DSS 100 MG Caps  Take 100 mg by mouth 2 (two) times daily.     oxyCODONE 5 MG immediate release tablet  Commonly known as:  Oxy IR/ROXICODONE  Take 1 tablet (5 mg total) by mouth every 4 (four) hours as needed.     PARoxetine 20 MG tablet  Commonly known as:  PAXIL  Take 20 mg by mouth daily after breakfast.     vitamin C 500 MG tablet  Commonly known as:  ASCORBIC ACID  Take 500 mg by mouth every evening.       Follow-up Information   Follow up with NITKA,Rylen Swindler E, MD In 2 weeks.   Contact information:   [address] [place] [address] [phone]       Call [facility]. (If questions as needed)    Contact information:   [address] [place] [address] [phone]       Signed: Cherylynn Ridges 03/04/2013, 5:38 PM

## 2013-03-04 NOTE — Progress Notes (Signed)
Patient ID: Shawn Wood, male   DOB: Sep 20, 1951, 62 y.o.   MRN: [id number] Subjective:    Photophobia, head ache and mid to upper back pain at about D3. Awake and alert and Ox4. Scheduled for CT of Head this AM.  Patient reports pain as moderate.    Objective:   VITALS:  Temp:  [97.8 F (36.6 C)-98.9 F (37.2 C)] 97.8 F (36.6 C) (05/04 0553) Pulse Rate:  [63-73] 63 (05/04 0553) Resp:  [16-20] 18 (05/04 0553) BP: (110-132)/(62-76) 121/63 mmHg (05/04 0553) SpO2:  [94 %-99 %] 97 % (05/04 0553) Weight:  [108.863 kg (240 lb)] 108.863 kg (240 lb) (05/03 1750)  Neurologically intact ABD soft Neurovascular intact Sensation intact distally Tender upper dorsal spine. C spine with full ROM.   LABS  Recent Labs  03/03/13 1211 03/03/13 1826 03/04/13 0545  HGB 15.3 14.2 13.4  WBC  --  10.3 7.1  PLT  --  218 209    Recent Labs  03/03/13 1211 03/04/13 0545  NA 140 135  K 3.7 4.0  CL 105 102  CO2  --  26  BUN 23 18  CREATININE 0.90 0.79  GLUCOSE 155* 129*   No results found for this basename: LABPT, INR,  in the last 72 hours   Assessment/Plan: Right closed scapula body fracturel Right closed rib fractures CT of chest suspicious for minimal wedge compression fx T3.      Advance diet CT of head ordered. Will request Dorsal spine CT as his Chest CT is suggestive of D3 wedge compression less than 20%. If CT confirms compression fx this is an area that is not able to be braced, rest and pain medications.  Shawn Wood E 03/04/2013, 11:20 AM

## 2013-03-04 NOTE — Progress Notes (Signed)
Patient ID: Shawn Wood, male   DOB: 1951-08-11, 62 y.o.   MRN: [id number]    Subjective: Pt with headache, nausea and photosensitivity, says this does happen at home when he is stressed.  He does remember hitting his head on concrete, no LOC, denies vision changes.  Objective: Vital signs in last 24 hours: Temp:  [97.8 F (36.6 C)-98.9 F (37.2 C)] 97.8 F (36.6 C) (05/04 0553) Pulse Rate:  [63-73] 63 (05/04 0553) Resp:  [16-20] 18 (05/04 0553) BP: (100-132)/(62-76) 121/63 mmHg (05/04 0553) SpO2:  [94 %-99 %] 97 % (05/04 0553) Weight:  [240 lb (108.863 kg)] 240 lb (108.863 kg) (05/03 1750) Last BM Date: 03/03/13  Intake/Output from previous day: 05/03 0701 - 05/04 0700 In: 1848.8 [P.O.:1020; I.V.:828.8] Out: -  Intake/Output this shift:    PE: Head: small head lac, atraumatic otherwise Neuro: CN 3-12 grossly intact Chest; mild decrease otherwise clear Right arm in sling  Lab Results:   Recent Labs  03/03/13 1826 03/04/13 0545  WBC 10.3 7.1  HGB 14.2 13.4  HCT 40.5 39.2  PLT 218 209   BMET  Recent Labs  03/03/13 1211 03/04/13 0545  NA 140 135  K 3.7 4.0  CL 105 102  CO2  --  26  GLUCOSE 155* 129*  BUN 23 18  CREATININE 0.90 0.79  CALCIUM  --  8.7   PT/INR No results found for this basename: LABPROT, INR,  in the last 72 hours CMP     Component Value Date/Time   NA 135 03/04/2013 0545   K 4.0 03/04/2013 0545   CL 102 03/04/2013 0545   CO2 26 03/04/2013 0545   GLUCOSE 129* 03/04/2013 0545   BUN 18 03/04/2013 0545   CREATININE 0.79 03/04/2013 0545   CALCIUM 8.7 03/04/2013 0545   PROT 7.3 06/15/2011 1143   ALBUMIN 4.3 06/15/2011 1143   AST 24 06/15/2011 1143   ALT 20 06/15/2011 1143   ALKPHOS 65 06/15/2011 1143   BILITOT 0.6 06/15/2011 1143   GFRNONAA >90 03/04/2013 0545   GFRAA >90 03/04/2013 0545   Lipase  No results found for this basename: lipase       Studies/Results: Dg Chest 1 View  03/04/2013  *RADIOLOGY REPORT*  Clinical Data: Multiple rib fractures   CHEST - 1 VIEW  Comparison: 03/03/2013  Findings: Cardiomediastinal silhouette is stable.  No diagnostic pneumothorax.  Mild left basilar atelectasis or infiltrate.  No pulmonary edema.  Again noted right scapular fracture.  IMPRESSION: No pulmonary edema.  Mild left basilar atelectasis or infiltrate. No diagnostic pneumothorax.  Again noted displaced fracture of the right scapula.   Original Report Authenticated By: Natasha Mead, M.D.    Dg Ribs Unilateral W/chest Right  03/03/2013  **ADDENDUM** CREATED: 03/03/2013 12:06:28  There is a displaced fracture of the right scapula.  **END ADDENDUM** SIGNED BY: Natasha Mead, M.D.   03/03/2013  *RADIOLOGY REPORT*  Clinical Data: Fall, shoulder injury, rib injury  RIGHT RIBS AND CHEST - 3+ VIEW  Comparison: 11/08/2009  Findings: Five views right ribs submitted.  Minimal displaced fracture right 7th rib.  No diagnostic pneumothorax.  IMPRESSION: Minimal displaced fracture right 7th rib.  No diagnostic pneumothorax.   Original Report Authenticated By: Natasha Mead, M.D.    Dg Shoulder Right  03/03/2013  *RADIOLOGY REPORT*  Clinical Data: Fall, shoulder pain  RIGHT SHOULDER - 2+ VIEW  Comparison: None.  Findings: Three views right shoulder submitted.  There is a displaced fracture of the right scapula.  IMPRESSION: Displaced fracture of the right scapula.   Original Report Authenticated By: Natasha Mead, M.D.    Ct Chest W Contrast  03/03/2013  *RADIOLOGY REPORT*  Clinical Data:  Larey Seat off riding lawn mower and complains of shoulder and rib pain.  CT CHEST, ABDOMEN AND PELVIS WITH CONTRAST  Technique:  Multidetector CT imaging of the chest, abdomen and pelvis was performed following the standard protocol during bolus administration of intravenous contrast.  Contrast: OMNIPAQUE IOHEXOL 300 MG/ML  SOLN  Comparison:   None.  CT CHEST  Findings:  There is no evidence for chest lymphadenopathy.  No evidence for pericardial or pleural fluid.  Normal appearance of the aortic arch and  mediastinum.  No evidence for a mediastinal hematoma.  The trachea and mainstem bronchi are patent without pneumothorax. There is a focal thickening along the right minor fissure on sequence four, image 33 which is likely an incidental finding. There is mild basilar atelectasis.  Comminuted fractures of the right scapula.  There is no evidence for intra-articular involvement.  The right shoulder is located. There is a mildly displaced fracture of the anterior right seventh rib.  There may be a nondisplaced fracture of the lateral right eighth rib.  Mildly displaced fracture of the posterior right ninth rib and question a subtle fracture of the posterior right eighth rib.  IMPRESSION: Comminuted right scapula fracture and multiple right rib fractures.  Negative for a pneumothorax.  CT ABDOMEN AND PELVIS  Findings:  No evidence for free intraperitoneal air.  1.3 cm low density structure in the left hepatic lobe is nonspecific but could represent a cyst.  Otherwise, there is a normal appearance of the liver, spleen, pancreas, gallbladder and adrenal glands.  There is a 7 mm nonobstructive stone in the left kidney.  2.2 cm round low density structure in the right kidney lower pole is suggestive for a cyst. There appears to be an adjacent smaller right renal cyst. No gross abnormality to the prostate, seminal vesicles and urinary bladder.  No significant free fluid or lymphadenopathy.  There are colonic diverticula without acute inflammatory changes.  Normal appearance of the appendix.  Both hips are located.  IMPRESSION: No acute abnormality within the abdomen or pelvis.  Nonobstructive left kidney stone, measuring 7 mm.   Original Report Authenticated By: Richarda Overlie, M.D.    Ct Cervical Spine Wo Contrast  03/03/2013  *RADIOLOGY REPORT*  Clinical Data: Trauma post fall  CT CERVICAL SPINE WITHOUT CONTRAST  Technique:  Multidetector CT imaging of the cervical spine was performed. Multiplanar CT image reconstructions were  also generated.  Comparison: None.  Findings: Axial images of the cervical spine shows no acute fracture or subluxation.  There is no pneumothorax in visualized lung apices.  Computer processed images shows disc space flattening with anterior and posterior spurring at C3-C4 C5-C6 and C 67 level. No prevertebral soft tissue swelling.  Cervical airway is patent. Coronal images shows no acute fracture or subluxation.  IMPRESSION: No acute fracture or subluxation.  Degenerative changes as described above.   Original Report Authenticated By: Natasha Mead, M.D.    Ct Abdomen Pelvis W Contrast  03/03/2013  *RADIOLOGY REPORT*  Clinical Data:  Larey Seat off riding lawn mower and complains of shoulder and rib pain.  CT CHEST, ABDOMEN AND PELVIS WITH CONTRAST  Technique:  Multidetector CT imaging of the chest, abdomen and pelvis was performed following the standard protocol during bolus administration of intravenous contrast.  Contrast: OMNIPAQUE IOHEXOL 300 MG/ML  SOLN  Comparison:   None.  CT CHEST  Findings:  There is no evidence for chest lymphadenopathy.  No evidence for pericardial or pleural fluid.  Normal appearance of the aortic arch and mediastinum.  No evidence for a mediastinal hematoma.  The trachea and mainstem bronchi are patent without pneumothorax. There is a focal thickening along the right minor fissure on sequence four, image 33 which is likely an incidental finding. There is mild basilar atelectasis.  Comminuted fractures of the right scapula.  There is no evidence for intra-articular involvement.  The right shoulder is located. There is a mildly displaced fracture of the anterior right seventh rib.  There may be a nondisplaced fracture of the lateral right eighth rib.  Mildly displaced fracture of the posterior right ninth rib and question a subtle fracture of the posterior right eighth rib.  IMPRESSION: Comminuted right scapula fracture and multiple right rib fractures.  Negative for a pneumothorax.  CT  ABDOMEN AND PELVIS  Findings:  No evidence for free intraperitoneal air.  1.3 cm low density structure in the left hepatic lobe is nonspecific but could represent a cyst.  Otherwise, there is a normal appearance of the liver, spleen, pancreas, gallbladder and adrenal glands.  There is a 7 mm nonobstructive stone in the left kidney.  2.2 cm round low density structure in the right kidney lower pole is suggestive for a cyst. There appears to be an adjacent smaller right renal cyst. No gross abnormality to the prostate, seminal vesicles and urinary bladder.  No significant free fluid or lymphadenopathy.  There are colonic diverticula without acute inflammatory changes.  Normal appearance of the appendix.  Both hips are located.  IMPRESSION: No acute abnormality within the abdomen or pelvis.  Nonobstructive left kidney stone, measuring 7 mm.   Original Report Authenticated By: Richarda Overlie, M.D.     Anti-infectives: Anti-infectives   None       Assessment/Plan Fall from tractor Right scapula fracture: in sling, per ortho, will need follow up with them as outpatient. Right rib fractures: OOB, pulmonary toilet, pain control, CXR ok this AM Headache with nausea: no head CT yesterday, will get CT this am, keep today, watch for neurological changes. PT/OT eval DVT prop: will hold his lovenox until we get his head CT, SCDS for now  LOS: 1 day    Lenardo Westwood 03/04/2013

## 2013-03-04 NOTE — Progress Notes (Signed)
Patient ID: Shawn Wood, male   DOB: 1951-08-22, 62 y.o.   MRN: [id number] Ct of head noncontrast negative for acute changed. CT of thoracic spine with small area of lucency through the anterior superior lip of T3 wedge compression deformity Of superior endplate consistent with closed minimal traumatic compression fracture T3. No change in activity other than rest recumbancy when painful. Expected healing in 6-12 weeks. Stable from ortho stand point for discharge.

## 2013-03-04 NOTE — Progress Notes (Addendum)
Agree with A&P of EW,PA. Head scan pending Wants to go home if scan is OK

## 2013-04-18 ENCOUNTER — Other Ambulatory Visit (INDEPENDENT_AMBULATORY_CARE_PROVIDER_SITE_OTHER): Payer: BC Managed Care – PPO

## 2013-04-18 DIAGNOSIS — Z0000 Encounter for general adult medical examination without abnormal findings: Secondary | ICD-10-CM

## 2013-04-18 LAB — HEPATIC FUNCTION PANEL
ALT: 20 U/L (ref 0–53)
AST: 23 U/L (ref 0–37)
Total Bilirubin: 1 mg/dL (ref 0.3–1.2)

## 2013-04-18 LAB — CBC WITH DIFFERENTIAL/PLATELET
Eosinophils Relative: 6.9 % — ABNORMAL HIGH (ref 0.0–5.0)
HCT: 43.5 % (ref 39.0–52.0)
Hemoglobin: 14.7 g/dL (ref 13.0–17.0)
Lymphs Abs: 1.4 10*3/uL (ref 0.7–4.0)
Monocytes Relative: 7.9 % (ref 3.0–12.0)
Platelets: 256 10*3/uL (ref 150.0–400.0)
RBC: 4.82 Mil/uL (ref 4.22–5.81)
WBC: 5.5 10*3/uL (ref 4.5–10.5)

## 2013-04-18 LAB — TSH: TSH: 1.07 u[IU]/mL (ref 0.35–5.50)

## 2013-04-18 LAB — POCT URINALYSIS DIPSTICK
Bilirubin, UA: NEGATIVE
Glucose, UA: NEGATIVE
Spec Grav, UA: 1.02
Urobilinogen, UA: 0.2

## 2013-04-18 LAB — LIPID PANEL
HDL: 52 mg/dL (ref >39.00)
LDL Cholesterol: 120 mg/dL — ABNORMAL HIGH (ref 0–99)
Total CHOL/HDL Ratio: 4
Triglycerides: 89 mg/dL (ref 0.0–149.0)
VLDL: 17.8 mg/dL (ref 0.0–40.0)

## 2013-04-18 LAB — BASIC METABOLIC PANEL
BUN: 24 mg/dL — ABNORMAL HIGH (ref 6–23)
GFR: 88.03 mL/min (ref >60.00)
Potassium: 4.5 mEq/L (ref 3.5–5.1)
Sodium: 135 mEq/L (ref 135–145)

## 2013-04-25 ENCOUNTER — Ambulatory Visit (INDEPENDENT_AMBULATORY_CARE_PROVIDER_SITE_OTHER): Payer: BC Managed Care – PPO | Admitting: Internal Medicine

## 2013-04-25 ENCOUNTER — Encounter: Payer: Self-pay | Admitting: Internal Medicine

## 2013-04-25 VITALS — BP 154/96 | HR 76 | Temp 98.5°F | Ht 69.0 in | Wt 259.0 lb

## 2013-04-25 DIAGNOSIS — R739 Hyperglycemia, unspecified: Secondary | ICD-10-CM

## 2013-04-25 DIAGNOSIS — R7309 Other abnormal glucose: Secondary | ICD-10-CM

## 2013-04-25 DIAGNOSIS — Z0000 Encounter for general adult medical examination without abnormal findings: Secondary | ICD-10-CM

## 2013-04-25 NOTE — Progress Notes (Signed)
Patient ID: Shawn Wood, male   DOB: 1950/11/04, 62 y.o.   MRN: [id number] cpx  Past Medical History  Diagnosis Date  . COLONIC POLYPS, HX OF 05/25/2007  . HYPERGLYCEMIA 08/07/2008  . TINEA PEDIS 07/19/2007  . Leg pain right leg  . Varicose veins     history of cellulitis    History   Social History  . Marital Status: Married    Spouse Name: N/A    Number of Children: N/A  . Years of Education: N/A   Occupational History  . Not on file.   Social History Main Topics  . Smoking status: Never Smoker   . Smokeless tobacco: Never Used  . Alcohol Use: Yes     Comment: occcassinal beer  . Drug Use: No  . Sexually Active: Yes   Other Topics Concern  . Not on file   Social History Narrative  . No narrative on file    Past Surgical History  Procedure Laterality Date  . Varicose vein surgery    . Knee surgery  1999    fracture    Family History  Problem Relation Age of Onset  . Breast cancer Mother   . Prostate cancer Father   . Brain cancer Sister   . Seizures Son     No Known Allergies  Current Outpatient Prescriptions on File Prior to Visit  Medication Sig Dispense Refill  . Ascorbic Acid (VITAMIN C) 500 MG tablet Take 500 mg by mouth every evening.       Marland Kitchen aspirin EC 81 MG tablet Take 81 mg by mouth every evening.       Marland Kitchen PARoxetine (PAXIL) 20 MG tablet Take 20 mg by mouth daily after breakfast.       No current facility-administered medications on file prior to visit.     patient denies chest pain, shortness of breath, orthopnea. Denies lower extremity edema, abdominal pain, change in appetite, change in bowel movements. Patient denies rashes, musculoskeletal complaints. No other specific complaints in a complete review of systems.   BP 154/96  Pulse 76  Temp(Src) 98.5 F (36.9 C) (Oral)  Ht 5\' 9"  (1.753 m)  Wt 259 lb (117.482 kg)  BMI 38.23 kg/m2   well-developed well-nourished male in no acute distress. HEENT exam atraumatic, normocephalic, neck  supple without jugular venous distention. Chest clear to auscultation cardiac exam S1-S2 are regular. Abdominal exam overweight with bowel sounds, soft and nontender. Extremities no edema. Neurologic exam is alert with a normal gait.  Well Visit- health maint utd

## 2013-06-06 ENCOUNTER — Other Ambulatory Visit: Payer: Self-pay | Admitting: Internal Medicine

## 2013-08-15 ENCOUNTER — Ambulatory Visit (INDEPENDENT_AMBULATORY_CARE_PROVIDER_SITE_OTHER): Payer: BC Managed Care – PPO

## 2013-08-15 DIAGNOSIS — Z23 Encounter for immunization: Secondary | ICD-10-CM

## 2013-10-01 ENCOUNTER — Ambulatory Visit (INDEPENDENT_AMBULATORY_CARE_PROVIDER_SITE_OTHER): Payer: No Typology Code available for payment source | Admitting: Podiatry

## 2013-10-01 ENCOUNTER — Encounter: Payer: Self-pay | Admitting: Podiatry

## 2013-10-01 VITALS — BP 134/80 | HR 70 | Resp 12 | Ht 67.0 in | Wt 240.0 lb

## 2013-10-01 DIAGNOSIS — M722 Plantar fascial fibromatosis: Secondary | ICD-10-CM

## 2013-10-01 MED ORDER — TRIAMCINOLONE ACETONIDE 10 MG/ML IJ SUSP
10.0000 mg | Freq: Once | INTRAMUSCULAR | Status: AC
  Administered 2013-10-01: 10 mg

## 2013-10-01 MED ORDER — DICLOFENAC SODIUM 75 MG PO TBEC: 75.0000 mg | DELAYED_RELEASE_TABLET | Freq: Two times a day (BID) | ORAL | Status: DC

## 2013-10-01 NOTE — Progress Notes (Signed)
Subjective:     Patient ID: Shawn Wood, male   DOB: [birth year], 62 y.o.   MRN: [id number]  HPI patient presents stating my left heel has been hurting me for several months. He went fishing for a week and this seemed to intensify a problem that was already present   Review of Systems     Objective:   Physical Exam Neurovascular status intact with muscle strength adequate. Intense discomfort plantar fascial left heel at the insertional point of the tendon into the calcaneus    Assessment:     Acute plantar fasciitis left heel    Plan:     Reviewed acute versus chronic plantar fasciitis and at this time reinjected the plantar fascia 3 mg Kenalog 5 mm Xylocaine Marcaine mixture and dispensed night splint with instructions on usage. Prescribed Voltaren 75 mg twice a day and reappoint in 3 weeks

## 2013-10-01 NOTE — Patient Instructions (Signed)
Plantar Fasciitis (Heel Spur Syndrome)  with Rehab  The plantar fascia is a fibrous, ligament-like, soft-tissue structure that spans the bottom of the foot. Plantar fasciitis is a condition that causes pain in the foot due to inflammation of the tissue.  SYMPTOMS   · Pain and tenderness on the underneath side of the foot.  · Pain that worsens with standing or walking.  CAUSES   Plantar fasciitis is caused by irritation and injury to the plantar fascia on the underneath side of the foot. Common mechanisms of injury include:  · Direct trauma to bottom of the foot.  · Damage to a small nerve that runs under the foot where the main fascia attaches to the heel bone.  · Stress placed on the plantar fascia due to bone spurs.  RISK INCREASES WITH:   · Activities that place stress on the plantar fascia (running, jumping, pivoting, or cutting).  · Poor strength and flexibility.  · Improperly fitted shoes.  · Tight calf muscles.  · Flat feet.  · Failure to warm-up properly before activity.  · Obesity.  PREVENTION  · Warm up and stretch properly before activity.  · Allow for adequate recovery between workouts.  · Maintain physical fitness:  · Strength, flexibility, and endurance.  · Cardiovascular fitness.  · Maintain a health body weight.  · Avoid stress on the plantar fascia.  · Wear properly fitted shoes, including arch supports for individuals who have flat feet.  PROGNOSIS   If treated properly, then the symptoms of plantar fasciitis usually resolve without surgery. However, occasionally surgery is necessary.  RELATED COMPLICATIONS   · Recurrent symptoms that may result in a chronic condition.  · Problems of the lower back that are caused by compensating for the injury, such as limping.  · Pain or weakness of the foot during push-off following surgery.  · Chronic inflammation, scarring, and partial or complete fascia tear, occurring more often from repeated injections.  TREATMENT   Treatment initially involves the use of  ice and medication to help reduce pain and inflammation. The use of strengthening and stretching exercises may help reduce pain with activity, especially stretches of the Achilles tendon. These exercises may be performed at home or with a therapist. Your caregiver may recommend that you use heel cups of arch supports to help reduce stress on the plantar fascia. Occasionally, corticosteroid injections are given to reduce inflammation. If symptoms persist for greater than 6 months despite non-surgical (conservative), then surgery may be recommended.   MEDICATION   · If pain medication is necessary, then nonsteroidal anti-inflammatory medications, such as aspirin and ibuprofen, or other minor pain relievers, such as acetaminophen, are often recommended.  · Do not take pain medication within 7 days before surgery.  · Prescription pain relievers may be given if deemed necessary by your caregiver. Use only as directed and only as much as you need.  · Corticosteroid injections may be given by your caregiver. These injections should be reserved for the most serious cases, because they may only be given a certain number of times.  HEAT AND COLD  · Cold treatment (icing) relieves pain and reduces inflammation. Cold treatment should be applied for 10 to 15 minutes every 2 to 3 hours for inflammation and pain and immediately after any activity that aggravates your symptoms. Use ice packs or massage the area with a piece of ice (ice massage).  · Heat treatment may be used prior to performing the stretching and strengthening activities prescribed   by your caregiver, physical therapist, or athletic trainer. Use a heat pack or soak the injury in warm water.  SEEK IMMEDIATE MEDICAL CARE IF:  · Treatment seems to offer no benefit, or the condition worsens.  · Any medications produce adverse side effects.  EXERCISES  RANGE OF MOTION (ROM) AND STRETCHING EXERCISES - Plantar Fasciitis (Heel Spur Syndrome)  These exercises may help you  when beginning to rehabilitate your injury. Your symptoms may resolve with or without further involvement from your physician, physical therapist or athletic trainer. While completing these exercises, remember:   · Restoring tissue flexibility helps normal motion to return to the joints. This allows healthier, less painful movement and activity.  · An effective stretch should be held for at least 30 seconds.  · A stretch should never be painful. You should only feel a gentle lengthening or release in the stretched tissue.  RANGE OF MOTION - Toe Extension, Flexion  · Sit with your right / left leg crossed over your opposite knee.  · Grasp your toes and gently pull them back toward the top of your foot. You should feel a stretch on the bottom of your toes and/or foot.  · Hold this stretch for __________ seconds.  · Now, gently pull your toes toward the bottom of your foot. You should feel a stretch on the top of your toes and or foot.  · Hold this stretch for __________ seconds.  Repeat __________ times. Complete this stretch __________ times per day.   RANGE OF MOTION - Ankle Dorsiflexion, Active Assisted  · Remove shoes and sit on a chair that is preferably not on a carpeted surface.  · Place right / left foot under knee. Extend your opposite leg for support.  · Keeping your heel down, slide your right / left foot back toward the chair until you feel a stretch at your ankle or calf. If you do not feel a stretch, slide your bottom forward to the edge of the chair, while still keeping your heel down.  · Hold this stretch for __________ seconds.  Repeat __________ times. Complete this stretch __________ times per day.   STRETCH  Gastroc, Standing  · Place hands on wall.  · Extend right / left leg, keeping the front knee somewhat bent.  · Slightly point your toes inward on your back foot.  · Keeping your right / left heel on the floor and your knee straight, shift your weight toward the wall, not allowing your back to  arch.  · You should feel a gentle stretch in the right / left calf. Hold this position for __________ seconds.  Repeat __________ times. Complete this stretch __________ times per day.  STRETCH  Soleus, Standing  · Place hands on wall.  · Extend right / left leg, keeping the other knee somewhat bent.  · Slightly point your toes inward on your back foot.  · Keep your right / left heel on the floor, bend your back knee, and slightly shift your weight over the back leg so that you feel a gentle stretch deep in your back calf.  · Hold this position for __________ seconds.  Repeat __________ times. Complete this stretch __________ times per day.  STRETCH  Gastrocsoleus, Standing   Note: This exercise can place a lot of stress on your foot and ankle. Please complete this exercise only if specifically instructed by your caregiver.   · Place the ball of your right / left foot on a step, keeping   your other foot firmly on the same step.  · Hold on to the wall or a rail for balance.  · Slowly lift your other foot, allowing your body weight to press your heel down over the edge of the step.  · You should feel a stretch in your right / left calf.  · Hold this position for __________ seconds.  · Repeat this exercise with a slight bend in your right / left knee.  Repeat __________ times. Complete this stretch __________ times per day.   STRENGTHENING EXERCISES - Plantar Fasciitis (Heel Spur Syndrome)   These exercises may help you when beginning to rehabilitate your injury. They may resolve your symptoms with or without further involvement from your physician, physical therapist or athletic trainer. While completing these exercises, remember:   · Muscles can gain both the endurance and the strength needed for everyday activities through controlled exercises.  · Complete these exercises as instructed by your physician, physical therapist or athletic trainer. Progress the resistance and repetitions only as guided.  STRENGTH - Towel  Curls  · Sit in a chair positioned on a non-carpeted surface.  · Place your foot on a towel, keeping your heel on the floor.  · Pull the towel toward your heel by only curling your toes. Keep your heel on the floor.  · If instructed by your physician, physical therapist or athletic trainer, add ____________________ at the end of the towel.  Repeat __________ times. Complete this exercise __________ times per day.  STRENGTH - Ankle Inversion  · Secure one end of a rubber exercise band/tubing to a fixed object (table, pole). Loop the other end around your foot just before your toes.  · Place your fists between your knees. This will focus your strengthening at your ankle.  · Slowly, pull your big toe up and in, making sure the band/tubing is positioned to resist the entire motion.  · Hold this position for __________ seconds.  · Have your muscles resist the band/tubing as it slowly pulls your foot back to the starting position.  Repeat __________ times. Complete this exercises __________ times per day.   Document Released: 10/18/2005 Document Revised: 01/10/2012 Document Reviewed: 01/30/2009  ExitCare® Patient Information ©2014 [employer].

## 2013-10-22 ENCOUNTER — Ambulatory Visit: Payer: No Typology Code available for payment source | Admitting: Podiatry

## 2013-11-16 ENCOUNTER — Ambulatory Visit (INDEPENDENT_AMBULATORY_CARE_PROVIDER_SITE_OTHER): Payer: No Typology Code available for payment source | Admitting: Family Medicine

## 2013-11-16 ENCOUNTER — Encounter: Payer: Self-pay | Admitting: Family Medicine

## 2013-11-16 VITALS — BP 132/80 | HR 86 | Temp 98.5°F | Wt 258.0 lb

## 2013-11-16 DIAGNOSIS — R319 Hematuria, unspecified: Secondary | ICD-10-CM

## 2013-11-16 LAB — URINALYSIS, MICROSCOPIC ONLY

## 2013-11-16 LAB — POCT URINALYSIS DIPSTICK
GLUCOSE UA: NEGATIVE
LEUKOCYTES UA: NEGATIVE
NITRITE UA: NEGATIVE
Spec Grav, UA: >=1.03
UROBILINOGEN UA: 1
pH, UA: 6

## 2013-11-16 NOTE — Patient Instructions (Signed)
Drink lots of fluids Follow up promptly for any fever or increased pain Hold aspirin for now   .Hematuria, Adult Hematuria is blood in your urine. It can be caused by a bladder infection, kidney infection, prostate infection, kidney stone, or cancer of your urinary tract. Infections can usually be treated with medicine, and a kidney stone usually will pass through your urine. If neither of these is the cause of your hematuria, further workup to find out the reason may be needed. It is very important that you tell your health care provider about any blood you see in your urine, even if the blood stops without treatment or happens without causing pain. Blood in your urine that happens and then stops and then happens again can be a symptom of a very serious condition. Also, pain is not a symptom in the initial stages of many urinary cancers. HOME CARE INSTRUCTIONS   Drink lots of fluid, 3 4 quarts a day. If you have been diagnosed with an infection, cranberry juice is especially recommended, in addition to large amounts of water.  Avoid caffeine, tea, and carbonated beverages, because they tend to irritate the bladder.  Avoid alcohol because it may irritate the prostate.  Only take over-the-counter or prescription medicines for pain, discomfort, or fever as directed by your health care provider.  If you have been diagnosed with a kidney stone, follow your health care provider's instructions regarding straining your urine to catch the stone.  Empty your bladder often. Avoid holding urine for long periods of time.  After a bowel movement, women should cleanse front to back. Use each tissue only once.  Empty your bladder before and after sexual intercourse if you are a male. SEEK MEDICAL CARE IF: You develop back pain, fever, a feeling of sickness in your stomach (nausea), or vomiting or if your symptoms are not better in 3 days. Return sooner if you are getting worse. SEEK IMMEDIATE MEDICAL  CARE IF:   You have a persistent fever, with a temperature of 101.48F (38.8C) or greater.  You develop severe vomiting and are unable to keep the medicine down.  You develop severe back or abdominal pain despite taking your medicines.  You begin passing a large amount of blood or clots in your urine.  You feel extremely weak or faint, or you pass out. MAKE SURE YOU:   Understand these instructions.  Will watch your condition.  Will get help right away if you are not doing well or get worse. Document Released: 10/18/2005 Document Revised: 08/08/2013 Document Reviewed: 06/18/2013 [facility] Patient Information 2014 [employer], [place].

## 2013-11-16 NOTE — Progress Notes (Signed)
Pre visit review using our clinic review tool, if applicable. No additional management support is needed unless otherwise documented below in the visit note. 

## 2013-11-16 NOTE — Progress Notes (Signed)
   Subjective:    Patient ID: Shawn Wood, male    DOB: 09-25-1951, 63 y.o.   MRN: [id number]  HPI Patient seen for gross hematuria with first episode Wednesday night 2 days ago. Had some somewhat intermittent hematuria since then.  Patient had one kidney stone several years ago and has had some dull flank pain 3/10 severity but much different than his previous stone. No abdominal pain. No fevers or chills. No appetite or weight changes.  Past Medical History  Diagnosis Date  . COLONIC POLYPS, HX OF 05/25/2007  . HYPERGLYCEMIA 08/07/2008  . TINEA PEDIS 07/19/2007  . Leg pain right leg  . Varicose veins     history of cellulitis   Past Surgical History  Procedure Laterality Date  . Varicose vein surgery    . Knee surgery  1999    fracture    reports that he has never smoked. He has never used smokeless tobacco. He reports that he drinks alcohol. He reports that he does not use illicit drugs. family history includes Brain cancer in his sister; Breast cancer in his mother; Prostate cancer in his father; Seizures in his son. No Known Allergies    Review of Systems  Constitutional: Negative for fever and chills.  Gastrointestinal: Negative for nausea, vomiting and abdominal pain.  Genitourinary: Positive for hematuria. Negative for dysuria.       Objective:   Physical Exam  Constitutional: He appears well-developed and well-nourished.  Cardiovascular: Normal rate.   Pulmonary/Chest: Effort normal and breath sounds normal. No respiratory distress. He has no wheezes. He has no rales.  Abdominal: Soft. He exhibits no distension and no mass. There is no tenderness. There is no rebound and no guarding.          Assessment & Plan:  Gross hematuria. Patient has urine dipstick which does not suggest urinary tract infection. Negative nitrites and negative leukocytes. Send urine for micro and urine culture. He has had prior kidney stone but this pain is not suspicious and is very  minimal. Drink plenty of fluids. Hold baby aspirin. Will likely need urology referral and we'll confirm with urine micro-first. Followup promptly for fever or worsening pain

## 2013-11-18 LAB — URINE CULTURE

## 2013-11-19 ENCOUNTER — Telehealth: Payer: Self-pay | Admitting: Internal Medicine

## 2013-11-19 NOTE — Telephone Encounter (Signed)
Pt would like blood work results °

## 2013-11-19 NOTE — Addendum Note (Signed)
Addended by: Kristian CoveyBURCHETTE, Shacora Zynda W on: 11/19/2013 07:23 AM   Modules accepted: Orders

## 2013-11-19 NOTE — Telephone Encounter (Signed)
Pt informed about lab results. ° °

## 2013-12-05 ENCOUNTER — Other Ambulatory Visit: Payer: Self-pay | Admitting: Internal Medicine

## 2014-06-02 ENCOUNTER — Encounter: Payer: Self-pay | Admitting: Family Medicine

## 2014-06-02 DIAGNOSIS — R319 Hematuria, unspecified: Secondary | ICD-10-CM | POA: Insufficient documentation

## 2014-06-03 ENCOUNTER — Ambulatory Visit (INDEPENDENT_AMBULATORY_CARE_PROVIDER_SITE_OTHER): Payer: Medicare Other | Admitting: Family Medicine

## 2014-06-03 ENCOUNTER — Encounter: Payer: Self-pay | Admitting: Family Medicine

## 2014-06-03 VITALS — BP 140/82 | HR 64 | Temp 98.2°F | Wt 263.0 lb

## 2014-06-03 DIAGNOSIS — F3289 Other specified depressive episodes: Secondary | ICD-10-CM | POA: Diagnosis not present

## 2014-06-03 DIAGNOSIS — R739 Hyperglycemia, unspecified: Secondary | ICD-10-CM

## 2014-06-03 DIAGNOSIS — Z23 Encounter for immunization: Secondary | ICD-10-CM | POA: Diagnosis not present

## 2014-06-03 DIAGNOSIS — E785 Hyperlipidemia, unspecified: Secondary | ICD-10-CM

## 2014-06-03 DIAGNOSIS — IMO0001 Reserved for inherently not codable concepts without codable children: Secondary | ICD-10-CM

## 2014-06-03 DIAGNOSIS — E669 Obesity, unspecified: Secondary | ICD-10-CM

## 2014-06-03 DIAGNOSIS — Z86010 Personal history of colonic polyps: Secondary | ICD-10-CM

## 2014-06-03 DIAGNOSIS — R319 Hematuria, unspecified: Secondary | ICD-10-CM | POA: Diagnosis not present

## 2014-06-03 DIAGNOSIS — R030 Elevated blood-pressure reading, without diagnosis of hypertension: Secondary | ICD-10-CM

## 2014-06-03 DIAGNOSIS — F329 Major depressive disorder, single episode, unspecified: Secondary | ICD-10-CM | POA: Diagnosis not present

## 2014-06-03 DIAGNOSIS — R7309 Other abnormal glucose: Secondary | ICD-10-CM | POA: Diagnosis not present

## 2014-06-03 DIAGNOSIS — F32A Depression, unspecified: Secondary | ICD-10-CM | POA: Insufficient documentation

## 2014-06-03 LAB — CBC
HCT: 43.3 % (ref 39.0–52.0)
HEMOGLOBIN: 14.4 g/dL (ref 13.0–17.0)
MCHC: 33.4 g/dL (ref 30.0–36.0)
MCV: 89.6 fl (ref 78.0–100.0)
PLATELETS: 236 10*3/uL (ref 150.0–400.0)
RBC: 4.83 Mil/uL (ref 4.22–5.81)
RDW: 13.1 % (ref 11.5–15.5)
WBC: 5 10*3/uL (ref 4.0–10.5)

## 2014-06-03 LAB — LIPID PANEL
CHOLESTEROL: 168 mg/dL (ref 0–200)
HDL: 51.7 mg/dL (ref >39.00)
LDL Cholesterol: 103 mg/dL — ABNORMAL HIGH (ref 0–99)
NonHDL: 116.3
TRIGLYCERIDES: 69 mg/dL (ref 0.0–149.0)
Total CHOL/HDL Ratio: 3
VLDL: 13.8 mg/dL (ref 0.0–40.0)

## 2014-06-03 LAB — COMPREHENSIVE METABOLIC PANEL
ALBUMIN: 3.9 g/dL (ref 3.5–5.2)
ALK PHOS: 55 U/L (ref 39–117)
ALT: 22 U/L (ref 0–53)
AST: 25 U/L (ref 0–37)
BUN: 17 mg/dL (ref 6–23)
CALCIUM: 8.8 mg/dL (ref 8.4–10.5)
CO2: 27 mEq/L (ref 19–32)
CREATININE: 0.9 mg/dL (ref 0.4–1.5)
Chloride: 102 mEq/L (ref 96–112)
GFR: 85.57 mL/min (ref >60.00)
GLUCOSE: 103 mg/dL — AB (ref 70–99)
POTASSIUM: 4.6 meq/L (ref 3.5–5.1)
Sodium: 135 mEq/L (ref 135–145)
Total Bilirubin: 0.9 mg/dL (ref 0.2–1.2)
Total Protein: 7.1 g/dL (ref 6.0–8.3)

## 2014-06-03 LAB — HEMOGLOBIN A1C: HEMOGLOBIN A1C: 6.3 % (ref 4.6–6.5)

## 2014-06-03 NOTE — Assessment & Plan Note (Addendum)
Check a1c today. We discussed healthy lifestyle changes to help reduce diabetes risk as well as help patient achieve weight of about a year ago. Discussed 3 month follow up and consideration of dietician referral

## 2014-06-03 NOTE — Progress Notes (Signed)
Pt last colonoscopy is in EPIC under procedures, last colonoscopy was in 2011 with a 1542yr repeat colonoscopy so pt next colonoscopy should be around 04/2015

## 2014-06-03 NOTE — Progress Notes (Signed)
Shawn ConchStephen Hunter, MD Phone: [phone]  Subjective:  Patient presents today to establish care with me as PCP. Chief complaint-noted.   Elevated Blood Pressure  BP Readings from Last 3 Encounters:  06/03/14 140/82  11/16/13 132/80  10/01/13 134/80  Home BP monitoring- SBP usually 124-130 Compliant with medications-no meds ROS-Denies any CP, HA, SOB, blurry vision, LE edema (other than in R leg after surgery), transient weakness, orthopnea, PND.   Hyperglycemia obesity Last a1c at 6.2. Patient has gained about 20 lbs in last year as well.  ROS- denies polyuria or symptoms of low blood sugars   The following were reviewed and entered/updated in epic: Past Medical History  Diagnosis Date  . COLONIC POLYPS, HX OF 05/25/2007  . HYPERGLYCEMIA 08/07/2008  . TINEA PEDIS 07/19/2007  . Leg pain right leg  . Varicose veins     history of cellulitis   Patient Active Problem List   Diagnosis Date Noted  . Depression 06/03/2014    Priority: Medium  . Obesity, unspecified 06/03/2014    Priority: Medium  . Hematuria 06/02/2014    Priority: Medium  . Hyperglycemia 04/25/2013    Priority: Medium  . COLONIC POLYPS, HX OF 05/25/2007    Priority: Medium  . Traumatic compression fracture of thoracic vertebra 03/04/2013    Priority: Low    Class: Acute  . Fracture of multiple ribs 03/04/2013    Priority: Low    Class: Acute  . Closed right scapular fracture 03/03/2013    Priority: Low    Class: Acute  . CELLULITIS, LEG, LEFT 11/06/2009    Priority: Low   Past Surgical History  Procedure Laterality Date  . Varicose vein surgery    . Knee surgery  1999    fracture    Family History  Problem Relation Age of Onset  . Breast cancer Mother   . Prostate cancer Father     late 3770s  . Brain cancer Sister   . Seizures Son   . Colon cancer Father     late 5770s, did not have colonscopies    Medications- reviewed and updated Current Outpatient Prescriptions  Medication Sig  Dispense Refill  . Ascorbic Acid (VITAMIN C) 500 MG tablet Take 500 mg by mouth every evening.       Marland Kitchen. aspirin EC 81 MG tablet Take 81 mg by mouth every evening.       . nystatin-triamcinolone (MYCOLOG II) cream Apply two times a day to both feet for 14 days  30 g  2  . PARoxetine (PAXIL) 20 MG tablet Take one tablet by mouth one time daily  90 tablet  3   No current facility-administered medications for this visit.    Allergies-reviewed and updated No Known Allergies  History   Social History  . Marital Status: Married    Spouse Name: N/A    Number of Children: N/A  . Years of Education: N/A   Social History Main Topics  . Smoking status: Never Smoker   . Smokeless tobacco: Never Used  . Alcohol Use: Yes     Comment: occcassinal beer  . Drug Use: No  . Sexual Activity: Yes   Other Topics Concern  . 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 1 son with  epilepsy stays at home and still has seizures. No grandkids.     ROS--See HPI   Objective: BP 140/82  Pulse 64  Temp(Src) 98.2 F (36.8 C)  Wt 263 lb (119.296 kg) Gen: NAD, resting comfortably in chair HEENT: Mucous membranes are moist. Oropharynx normal Neck: no thyromegaly, no lymphadenopathy CV: RRR no murmurs rubs or gallops Lungs: CTAB no crackles, wheeze, rhonchi Ext: trace edema in right leg (chronic >L due to history surgery) Rectal: diffuse enlargement of prostate Skin: warm, dry  Assessment/Plan:  Hyperglycemia Obesity Check a1c today. We discussed healthy lifestyle changes to help reduce diabetes risk as well as help patient achieve weight of about a year ago. Discussed 3 month follow up and consideration of dietician referral   Elevated blood pressure 1 time reading SBP at 140 today  previously less than this. Discussed goal of 150/90 BP given age but that would still consider HTN diagnosis if BP remains at or above 140. We discussed regular exercise and DASH diet.   Prostate cancer screening Father with history of prostate cancer and patient requests PSA testing. DRE shows diffuse enlargement and patient does have nocturia. Will get PSA at next visit (had intended to place in labs today)   COLONIC POLYPS, HX OF Request records from [facility] to see when patient needs next colonoscopy. Assume 2016 with reported normal in 2011.   Hematuria Patient reports it was found hematuria due to kidney stones. Will try to get records.   Next visit 3 mo: wt check/counsel, f/u BP, check PSA  Orders Placed This Encounter  Procedures  . Pneumococcal polysaccharide vaccine 23-valent greater than or equal to 2yo subcutaneous/IM  . CBC    [place]  . Comprehensive metabolic panel    [place]  . Hemoglobin A1c    [place]  . Lipid panel    [place]  . TSH       >50% of 30 minute office visit was spent on counseling (PSA/prostate cancer screening, diet/nutrition, diabetes risk) and coordination of care (efforts to obtain records)

## 2014-06-03 NOTE — Assessment & Plan Note (Signed)
Request records from [facility] to see when patient needs next colonoscopy. Assume 2016.

## 2014-06-03 NOTE — Assessment & Plan Note (Signed)
Patient reports it was found hematuria due to kidney stones. Will try to get records.

## 2014-06-03 NOTE — Patient Instructions (Signed)
Labs today.  We will try to get your colonoscopy records if they were at [place].  Blood pressure slightly up today I am concerned about your risk for diabetes. We discussed need for 150 minutes exercise per week and healthier eating habits.  Let's check in 3 months from today even if no diabetes so we can follow up weight and other changes. Goal weight loss would be about 1 lb per week. Goal by the end of this year would be 240-245. We can send you to nutrition if you need additional support.   My 5 to Fitness!  5: fruits and vegetables per day (work on 9 per day if you are at 5) 4: exercise 5 times per week for at least 30 minutes (walking counts!) 3: meals per day (don't skip breakfast!) 2: habits to quit  -smoking  -excess alcohol use (men >2 beer/day; women >1beer/day) 1: sweet per day (2 cookies, 1 small cup of ice cream, 12 oz soda)  These are general tips for healthy living. Try to start with 1 or 2 habit TODAY and make it a part of your life for several months.   Once you have 1 or 2 habits down for several months, try to begin working on your next healthy habit. With every single step you take, you will be leading a healthier lifestyle!

## 2014-06-04 ENCOUNTER — Encounter: Payer: Self-pay | Admitting: Family Medicine

## 2014-06-04 DIAGNOSIS — E785 Hyperlipidemia, unspecified: Secondary | ICD-10-CM | POA: Insufficient documentation

## 2014-06-04 LAB — TSH: TSH: 1.03 u[IU]/mL (ref 0.35–4.50)

## 2014-06-15 ENCOUNTER — Other Ambulatory Visit: Payer: Self-pay | Admitting: Internal Medicine

## 2014-08-07 ENCOUNTER — Ambulatory Visit: Payer: BC Managed Care – PPO

## 2014-08-14 ENCOUNTER — Ambulatory Visit (INDEPENDENT_AMBULATORY_CARE_PROVIDER_SITE_OTHER): Payer: Medicare Other

## 2014-08-14 DIAGNOSIS — Z23 Encounter for immunization: Secondary | ICD-10-CM

## 2014-09-03 ENCOUNTER — Ambulatory Visit: Payer: BC Managed Care – PPO | Admitting: Family Medicine

## 2014-09-12 ENCOUNTER — Other Ambulatory Visit: Payer: Self-pay | Admitting: Family Medicine

## 2014-09-18 ENCOUNTER — Encounter: Payer: Self-pay | Admitting: Family Medicine

## 2014-09-18 ENCOUNTER — Ambulatory Visit (INDEPENDENT_AMBULATORY_CARE_PROVIDER_SITE_OTHER): Payer: Medicare Other | Admitting: Family Medicine

## 2014-09-18 VITALS — BP 120/82 | HR 66 | Temp 100.0°F | Wt 256.0 lb

## 2014-09-18 DIAGNOSIS — R0789 Other chest pain: Secondary | ICD-10-CM | POA: Diagnosis not present

## 2014-09-18 NOTE — Patient Instructions (Signed)
Keep a log of your discomfort (how long it lasts, how often it comes, any new symptoms). Message me in 1 week.   If continues about every other day, we can try refluex medicine If it does not, great!   If reflux medicine does not help, we will have you back to consider other workup such as EKG though overall your main risk factor for a heart issue is your age and weight (cholesterol just a hair high)

## 2014-09-18 NOTE — Progress Notes (Signed)
Tana ConchStephen Kazuma Elena, MD Phone: [phone]  Subjective:   Shawn Wood is a 63 y.o. year old very pleasant male patient who presents with the following:  Chest Discomfort/right low back pain 1 week ago felt some "indigestion" in his chest while watching television and sitting in his chair feeling. Feels like a tingling or burning in the center of his chest, could feel it but denies any pain. More of discomfort. Has always happened while sitting. No exertional component. Pain lasts a few seconds then goes away. Feeling it everyother day and usually just once but not in last 2 days. No particular food makes it worse. Tried some of of wife's medicines and not sure if this helps. Does admit to a lot of coffee. Does admit to some stress in caring for son with epilepsy.   Pain down his right lower back and radiating around to his abdomen for a few days and then stopped.   Started to feel like he was catching a cold yesterday. Coughing/congestion/sneezing. No sore throat. Mild elevated temperature.   Feeling better today other than cold.   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 No sweating or nausea/vomiting.   Past Medical History- Patient Active Problem List   Diagnosis Date Noted  . Hyperlpidemia 06/04/2014    Priority: Medium  . Depression 06/03/2014    Priority: Medium  . Obesity, unspecified 06/03/2014    Priority: Medium  . Hematuria 06/02/2014    Priority: Medium  . Hyperglycemia 04/25/2013    Priority: Medium  . Traumatic compression fracture of thoracic vertebra 03/04/2013    Priority: Low    Class: Acute  . Fracture of multiple ribs 03/04/2013    Priority: Low    Class: Acute  . Closed right scapular fracture 03/03/2013    Priority: Low    Class: Acute  . CELLULITIS, LEG, LEFT 11/06/2009    Priority: Low  . COLONIC POLYPS, HX OF 05/25/2007    Priority: Low   Medications-  reviewed and updated Current Outpatient Prescriptions  Medication Sig Dispense Refill  . Ascorbic Acid (VITAMIN C) 500 MG tablet Take 500 mg by mouth every evening.     Marland Kitchen. aspirin EC 81 MG tablet Take 81 mg by mouth every evening.     Marland Kitchen. PARoxetine (PAXIL) 20 MG tablet TAKE ONE TABLET BY MOUTH EVERY DAY 30 tablet 0  . nystatin-triamcinolone (MYCOLOG II) cream Apply two times a day to both feet for 14 days 30 g 2   No current facility-administered medications for this visit.    Objective: BP 120/82 mmHg  Pulse 66  Temp(Src) 100 F (37.8 C)  Wt 256 lb (116.121 kg) Gen: NAD, resting comfortably on table CV: RRR no murmurs rubs or gallops, able to walk quickly down the hall without pain or shortness of breath No chest wall tenderness No CVA tenderness Lungs: CTAB no crackles, wheeze, rhonchi Abdomen: soft/nontender/nondistended/normal bowel sounds. No rebound or guarding.  Skin: warm, dry, no rash Neuro: grossly normal, moves all extremities   Assessment/Plan:  Chest Discomfort/right low back pain Noncardiac in nature (not exertional, not relieved by rest, few seconds of tingling with no associated symptoms such as shortness of breath, neck, arm pain, nausea). Risk factors primarily age and obesity. No BP issues. LDL only 103.  Also doesn't particularly sound like GERD. Could be stress/anxiety related. Monitor symptoms x 1 week (currently gone for 2  days but otherwise every other day)and if worsening or new-seek care. If not, could trial 1 week Genella RifeGerd medicine and follow up if does not resolve on that.   Regarding back pain certainly could be nephrolithiasis related. Will monitor for now.

## 2014-10-16 ENCOUNTER — Telehealth: Payer: Self-pay | Admitting: Family Medicine

## 2014-10-16 MED ORDER — PAROXETINE HCL 20 MG PO TABS: 20.0000 mg | ORAL_TABLET | Freq: Every day | ORAL | Status: DC

## 2014-10-16 NOTE — Telephone Encounter (Signed)
Pt request refill PARoxetine (PAXIL) 20 MG tablet Pharm called for pt. [facility]

## 2014-10-16 NOTE — Telephone Encounter (Signed)
Medication refilled

## 2014-11-07 ENCOUNTER — Ambulatory Visit (INDEPENDENT_AMBULATORY_CARE_PROVIDER_SITE_OTHER): Payer: PPO | Admitting: Family Medicine

## 2014-11-07 ENCOUNTER — Encounter: Payer: Self-pay | Admitting: Family Medicine

## 2014-11-07 VITALS — BP 140/78 | HR 76 | Temp 99.2°F | Wt 261.0 lb

## 2014-11-07 DIAGNOSIS — R072 Precordial pain: Secondary | ICD-10-CM

## 2014-11-07 DIAGNOSIS — R0789 Other chest pain: Secondary | ICD-10-CM | POA: Insufficient documentation

## 2014-11-07 NOTE — Assessment & Plan Note (Addendum)
Recurrent episodes of central chest pain. Continue to be not exertional, not relieved by rest, lasting only a few seconds of tingling with no associated symptoms such as shortness of breath, neck, arm pain, nausea. Patient now with right sided rib pain radiating to stomach when previously had back pain. Risk factors are obesity, very mild hyperlipidemia with LDL 103, obesity, age. Blood pressure mildly elevated systolic but previously well controlled. Given relation to coffee, this certainly could be GERD though he trialed a PPI one time and had recurrent episode that day. Could also be anxiety. Given recurrence, we will plan for a exercise myocardial perfusion scan (or EKG treadmill test if for some reason this does not work for patient, or lexiscan myoview if needed). If negative test, plan for PPI trial x 2 weeks, patient is going to go ahead and cut to decaf, we discussed possibly stopping coffee as an option as well.

## 2014-11-07 NOTE — Progress Notes (Signed)
Tana ConchStephen Janira Mandell, MD Phone: [phone]  Subjective:   Shawn CoderJohn H Wood is a 64 y.o. year old very pleasant male patient who presents with the following:   Chest pain and right sided rib pain Feelings in center of chest like "indigestion" usually starting in the morning after coffee but occasionally at night. No other particular food associations. At same time, has Pain down right rib cage radiating to the stomachSeems to occur whiel watching a movie, not exertional. Not clear if occurs more when thinking about his son-main stressor in his life. Has never occurred while working out in yard or exerting himself. chest pain is like a discomfort mild to moderate. Side pain and abdominal pain described as an ache, both lastingng a few seconds then going away.   Similar symptoms in November which self resolved. This started right before christmas would come and go. Perhaps 2-3 days a week, stopped Sunday and this is his longest stretch without pain.   ROS- denies palpitations, fever, chills, cough, nausea, vomiting, diaphoresis, arm or neck pain on left side  Past Medical History- Patient Active Problem List   Diagnosis Date Noted  . Hyperlpidemia 06/04/2014    Priority: Medium  . Depression 06/03/2014    Priority: Medium  . Obesity, unspecified 06/03/2014    Priority: Medium  . Hematuria 06/02/2014    Priority: Medium  . Hyperglycemia 04/25/2013    Priority: Medium  . Traumatic compression fracture of thoracic vertebra 03/04/2013    Priority: Low    Class: Acute  . Fracture of multiple ribs 03/04/2013    Priority: Low    Class: Acute  . Closed right scapular fracture 03/03/2013    Priority: Low    Class: Acute  . CELLULITIS, LEG, LEFT 11/06/2009    Priority: Low  . COLONIC POLYPS, HX OF 05/25/2007    Priority: Low  . Atypical chest pain 11/07/2014   Medications- reviewed and updated Current Outpatient Prescriptions  Medication Sig Dispense Refill  . Ascorbic Acid (VITAMIN C) 500  MG tablet Take 500 mg by mouth every evening.     Marland Kitchen. aspirin EC 81 MG tablet Take 81 mg by mouth every evening.     Marland Kitchen. PARoxetine (PAXIL) 20 MG tablet Take 1 tablet (20 mg total) by mouth daily. 30 tablet 1  . nystatin-triamcinolone (MYCOLOG II) cream Apply two times a day to both feet for 14 days (Patient not taking: Reported on 11/07/2014) 30 g 2   No current facility-administered medications for this visit.   Objective: BP 140/78 mmHg  Pulse 76  Temp(Src) 99.2 F (37.3 C)  Wt 261 lb (118.389 kg) Gen: NAD, resting comfortably on table CV: RRR no murmurs rubs or gallops, once again able to walk quickly down the hall without pain or shortness of breath No chest wall tenderness, no rib tenderness or flank tenderness Lungs: CTAB no crackles, wheeze, rhonchi Abdomen: soft/nontender/nondistended/normal bowel sounds. No rebound or guarding. No hepatosplenomegaly. Skin: warm, dry, no rash to suggest shingles.   PHQ9 of 3 with no anhedonia or depressed mood-does not seem to be depression related.    Assessment/Plan:  Atypical chest pain Recurrent episodes of central chest pain. Continue to be not exertional, not relieved by rest, lasting only a few seconds of tingling with no associated symptoms such as shortness of breath, neck, arm pain, nausea. Patient now with right sided rib pain radiating to stomach when previously had back pain. Risk factors are obesity, very mild hyperlipidemia with LDL 103, obesity, age. Blood pressure  mildly elevated systolic but previously well controlled. Given relation to coffee, this certainly could be GERD though he trialed a PPI one time and had recurrent episode that day. Could also be anxiety. Given recurrence, we will plan for a exercise myocardial perfusion scan (or EKG treadmill test if for some reason this does not work for patient, or lexiscan myoview if needed). If negative test, plan for PPI trial x 2 weeks, patient is going to go ahead and cut to decaf, we  discussed possibly stopping coffee as an option as well.   biliary colic would be possibility, could consider RUQ ultrasound.   Return precautions advised and reasons to go to ED discussed.   Orders Placed This Encounter  Procedures  . Myocardial Perfusion Imaging    Standing Status: Future     Number of Occurrences:      Standing Expiration Date: 11/08/2015    Order Specific Question:  Where should this test be performed    Answer:  [facility])    Order Specific Question:  Type of stress    Answer:  Exercise    Order Specific Question:  Patient weight in lbs    Answer:  261

## 2014-11-07 NOTE — Patient Instructions (Addendum)
Let's get a stress test to rule out any cardiac cause of pain. Call us if don't hear within 2 weeks. I highly doubt cardiac cause. Think more likely anxiety/stress or reflux. Cut to decaf to see if that keeps pain away. Also decreased coffee in general may help. If these don't help, could try prilosec 2 week trial (restarting anytime you had these symptoms)  If you have shortness of breath, nausea, sweating, please seek care immediately  Check in within 1-2 weeks after stress test.

## 2014-11-12 ENCOUNTER — Ambulatory Visit (HOSPITAL_COMMUNITY): Payer: PPO | Attending: Cardiology | Admitting: Radiology

## 2014-11-12 DIAGNOSIS — R079 Chest pain, unspecified: Secondary | ICD-10-CM | POA: Diagnosis present

## 2014-11-12 DIAGNOSIS — R072 Precordial pain: Secondary | ICD-10-CM | POA: Diagnosis not present

## 2014-11-12 MED ORDER — REGADENOSON 0.4 MG/5ML IV SOLN
0.4000 mg | Freq: Once | INTRAVENOUS | Status: AC
  Administered 2014-11-12: 0.4 mg via INTRAVENOUS

## 2014-11-12 MED ORDER — TECHNETIUM TC 99M SESTAMIBI GENERIC - CARDIOLITE
11.0000 | Freq: Once | INTRAVENOUS | Status: AC | PRN
  Administered 2014-11-12: 11 via INTRAVENOUS

## 2014-11-12 MED ORDER — TECHNETIUM TC 99M SESTAMIBI GENERIC - CARDIOLITE
33.0000 | Freq: Once | INTRAVENOUS | Status: AC | PRN
  Administered 2014-11-12: 33 via INTRAVENOUS

## 2014-11-12 NOTE — Progress Notes (Signed)
[facility] SITE 3 NUCLEAR MED [address] [place], [place] [address] [phone]    Cardiology Nuclear Med Study  Shawn LeepJohn H Craige Wood is a 64 y.o. male     MRN : [id number]     DOB: 04-Jan-1951  Procedure Date: 11/12/2014  Nuclear Med Background Indication for Stress Test:  Evaluation for Ischemia History:  No Cardiac History Cardiac Risk Factors: N/A  Symptoms:  Chest Pain   Nuclear Pre-Procedure Caffeine/Decaff Intake:  None NPO After: 6 pm   Lungs:  clear O2 Sat: 95% on room air. IV 0.9% NS with Angio Cath:  22g  IV Site: R Hand  IV Started by:  Bonnita LevanJackie Smith, RN  Chest Size (in):  48 Cup Size: n/a  Height: 5\' 8"  (1.727 m)  Weight:  258 lb (117.028 kg)  BMI:  Body mass index is 39.24 kg/(m^2). Tech Comments:  N/A    Nuclear Med Study 1 or 2 day study: 1 day  Stress Test Type:  Lexiscan  Reading MD: N/A  Order Authorizing Provider:  Tana ConchStephen Hunter, MD  Resting Radionuclide: Technetium 6763m Sestamibi  Resting Radionuclide Dose: 11.0 mCi   Stress Radionuclide:  Technetium 6263m Sestamibi  Stress Radionuclide Dose: 33.0 mCi           Stress Protocol Rest HR: 63 Stress HR: 99  Rest BP: 136/83 Stress BP: 146/79  Exercise Time (min): n/a METS: n/a   Predicted Max HR: 155 bpm % Max HR: 63.87 bpm Rate Pressure Product: [id number]   Dose of Adenosine (mg):  n/a Dose of Lexiscan: 0.4 mg  Dose of Atropine (mg): n/a Dose of Dobutamine: n/a mcg/kg/min (at max HR)  Stress Test Technologist: Bonnita LevanJackie Smith, RN  Nuclear Technologist:  Kerby NoraElzbieta Kubak, CNMT     Rest Procedure:  Myocardial perfusion imaging was performed at rest 45 minutes following the intravenous administration of Technetium 3863m Sestamibi. Rest ECG: NSR with non-specific ST-T wave changes  Stress Procedure:  The patient received IV Lexiscan 0.4 mg over 15-seconds.  Technetium 7163m Sestamibi injected at 30-seconds.  Quantitative spect images were obtained after a 45 minute delay. Stress ECG: No significant change  from baseline ECG  QPS Raw Data Images:  Mild diaphragmatic attenuation; normal left ventricular size. Stress Images:  Normal homogeneous uptake in all areas of the myocardium. Rest Images:  Normal homogeneous uptake in all areas of the myocardium. Subtraction (SDS):  No evidence of ischemia. Transient Ischemic Dilatation (Normal <1.22):  1.06 Lung/Heart Ratio (Normal <0.45):  0.34  Quantitative Gated Spect Images QGS EDV:  104 ml QGS ESV:  53 ml  Impression Exercise Capacity:  Lexiscan with no exercise. BP Response:  Normal blood pressure response. Clinical Symptoms:  No significant symptoms noted. ECG Impression:  No significant ST segment change suggestive of ischemia. Comparison with Prior Nuclear Study: No previous nuclear study performed  Overall Impression:  Normal stress nuclear study.  LV Ejection Fraction: 49%.  LV Wall Motion:  Low normal LVEF. Normal regional wall motion.    [name], [name] 11/12/2014

## 2014-11-14 ENCOUNTER — Telehealth: Payer: Self-pay | Admitting: Family Medicine

## 2014-11-14 NOTE — Telephone Encounter (Signed)
Called and spoke with pt and she had our office confused with cardiologist.

## 2014-11-14 NOTE — Telephone Encounter (Signed)
Patient's wife states you spoke to the patient yesterday and gave him names of 2 medications to take.  Patient did not write them down and doesn't know what they were.  She would like for you to call her back with the names please.

## 2014-11-15 ENCOUNTER — Telehealth: Payer: Self-pay | Admitting: Family Medicine

## 2014-11-15 NOTE — Telephone Encounter (Signed)
Pt wife called yesterday stating that she received a call from our office stating for her husband to try two new medications but then stated that she has us confused with cardiology. She called back today stating the same and says that she dosent know who she spoke with at cardiology but they told her a note was sent to them from me but they wouldn't tell her what the note was about. I called the cardiologist office and they had no idea what i was talking about. Dr. Durene CalHunter did you send anything or call this pt regarding two new medications that he needs to try? Im not sure where the confusion is coming from.

## 2014-11-15 NOTE — Telephone Encounter (Signed)
Wife returning your call. pls cb.

## 2014-11-15 NOTE — Telephone Encounter (Signed)
Omeprazole (generic for prilosec) OTC  20mg  for 2-3 weeks and cut out caffeine. Follow up with me in 2 weeks. If this is not effective, consider anxiety as cause.

## 2014-11-18 NOTE — Telephone Encounter (Signed)
Pt.notified

## 2014-11-27 ENCOUNTER — Ambulatory Visit (INDEPENDENT_AMBULATORY_CARE_PROVIDER_SITE_OTHER): Payer: PPO | Admitting: Family Medicine

## 2014-11-27 ENCOUNTER — Encounter: Payer: Self-pay | Admitting: Family Medicine

## 2014-11-27 VITALS — BP 140/84 | Temp 98.8°F | Wt 260.0 lb

## 2014-11-27 DIAGNOSIS — F32A Depression, unspecified: Secondary | ICD-10-CM

## 2014-11-27 DIAGNOSIS — R0789 Other chest pain: Secondary | ICD-10-CM

## 2014-11-27 DIAGNOSIS — F329 Major depressive disorder, single episode, unspecified: Secondary | ICD-10-CM

## 2014-11-27 MED ORDER — PAROXETINE HCL 40 MG PO TABS: 40.0000 mg | ORAL_TABLET | Freq: Every day | ORAL | Status: DC

## 2014-11-27 NOTE — Progress Notes (Signed)
  Tana ConchStephen Samael Blades, MD Phone: [phone]  Subjective:   Shawn Wood is a 64 y.o. year old very pleasant male patient who presents with the following:  Atypical Chest pain -cut out caffeine and on decaf. 2 episodes in about 2 weeks. Every other day previously. Continues to feel like "indigestion" in central portion and radiate to right chest.  Not associated with food intake except some worse with coffee. Patient today finally admits that stress from dealing with his son is rather high and he suspects his anxiety is poorly control. He asks to increase paxil and this was also requested by his wife per patient shortness of breath, neck, arm pain, nausea.   Reviewed myocardial pefusion stress test which did not show any acute changes with stress.   ROS- No SI/HI. Continues to not occur with exertion. No   Past Medical History- Patient Active Problem List   Diagnosis Date Noted  . Hyperlpidemia 06/04/2014    Priority: Medium  . Depression 06/03/2014    Priority: Medium  . Obesity, unspecified 06/03/2014    Priority: Medium  . Hematuria 06/02/2014    Priority: Medium  . Hyperglycemia 04/25/2013    Priority: Medium  . Traumatic compression fracture of thoracic vertebra 03/04/2013    Priority: Low    Class: Acute  . Fracture of multiple ribs 03/04/2013    Priority: Low    Class: Acute  . Closed right scapular fracture 03/03/2013    Priority: Low    Class: Acute  . CELLULITIS, LEG, LEFT 11/06/2009    Priority: Low  . COLONIC POLYPS, HX OF 05/25/2007    Priority: Low  . Atypical chest pain 11/07/2014   Medications- reviewed and updated Current Outpatient Prescriptions  Medication Sig Dispense Refill  . Ascorbic Acid (VITAMIN C) 500 MG tablet Take 500 mg by mouth every evening.     Marland Kitchen. aspirin EC 81 MG tablet Take 81 mg by mouth every evening.     Marland Kitchen. PARoxetine (PAXIL) 20 MG tablet Take 1 tablet (20 mg total) by mouth daily. 30 tablet 1  . nystatin-triamcinolone (MYCOLOG II) cream  Apply two times a day to both feet for 14 days (Patient not taking: Reported on 11/07/2014) 30 g 2   No current facility-administered medications for this visit.    Objective: BP 140/84 mmHg  Temp(Src) 98.8 F (37.1 C)  Wt 260 lb (117.935 kg) Gen: NAD, resting comfortably in chair CV: RRR no murmurs rubs or gallops Lungs: CTAB no crackles, wheeze, rhonchi Abdomen: soft/nontender/nondistended/normal bowel sounds. No rebound or guarding.  Skin: warm, dry, no rash over chest  Neuro: grossly normal, moves all extremities   Assessment/Plan:  Atypical chest pain Suspect chest pain is either anxiety related or related to reflux. Reviewed low risk stress test.  Patient wants to trial increased paxil and I agreed this was reasonable. Follow up 6 weeks with sooner return precautions discussed. Would definitely consider PPI if does not improve.    Depression Increase paxil to 40mg . Patient with both anxiety and depressive features though no SI. Anxiety may be cause of chest pain. F/u 6 weeks with sooner return precautions given.    6 wk fu.   Meds ordered this encounter  Medications  . PARoxetine (PAXIL) 40 MG tablet    Sig: Take 1 tablet (40 mg total) by mouth daily.    Dispense:  30 tablet    Refill:  5

## 2014-11-27 NOTE — Patient Instructions (Signed)
Your stress test was normal which is reassuring. Anxiety with all you have on your plate could certainly be the cause. Let's increase your paxil to 40mg  and follow up in 6 weeks. You would need to see me immediately if you had worsening chest pain or if you had thoughts of hurting yourself.   In 6 weeks, if you are still having symptoms, we would consider a reflux medicine.

## 2014-11-28 NOTE — Assessment & Plan Note (Addendum)
Suspect chest pain is either anxiety related or related to reflux. Reviewed low risk stress test.  Patient wants to trial increased paxil and I agreed this was reasonable. Follow up 6 weeks with sooner return precautions discussed. Would definitely consider PPI if does not improve.

## 2014-11-28 NOTE — Assessment & Plan Note (Signed)
Increase paxil to 40mg . Patient with both anxiety and depressive features though no SI. Anxiety may be cause of chest pain. F/u 6 weeks with sooner return precautions given.

## 2015-01-24 IMAGING — CT CT HEAD W/O CM
1 series · 16 of 30 positions shown, 20 images · non-contrast
Comparison: No comparison head CT or brain MR.

CLINICAL DATA: Trauma yesterday.  Nausea and headache.

CT HEAD WITHOUT CONTRAST
TECHNIQUE: Contiguous axial images were obtained from the base of
the skull through the vertex without contrast.

[Series 2: head trauma 4.8 h37s · axial · 0.45mm/px · z∈[-123,+32]mm · 16 of 36 slices shown, 20 images]
[im 2/36  brain]
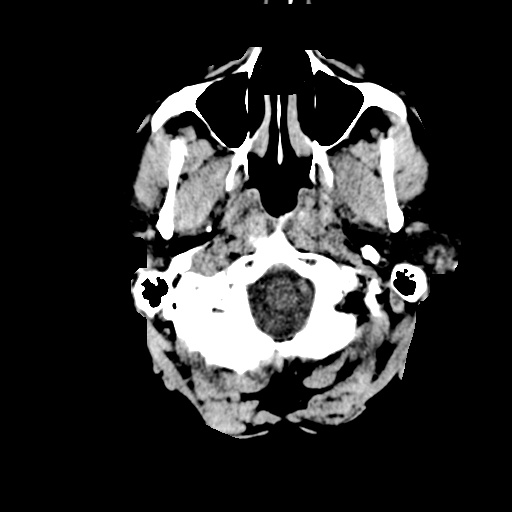
[im 2/36  bone]
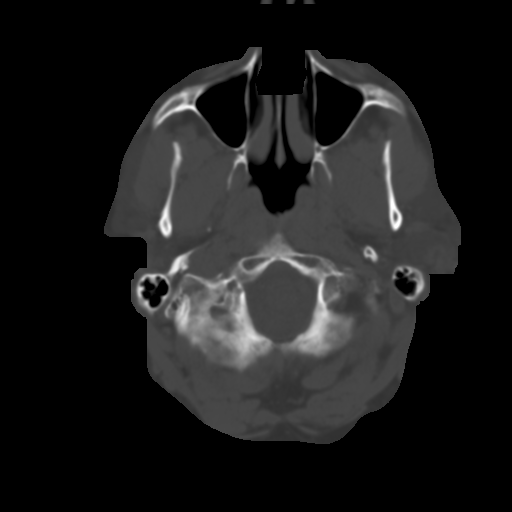
[im 4/36  brain]
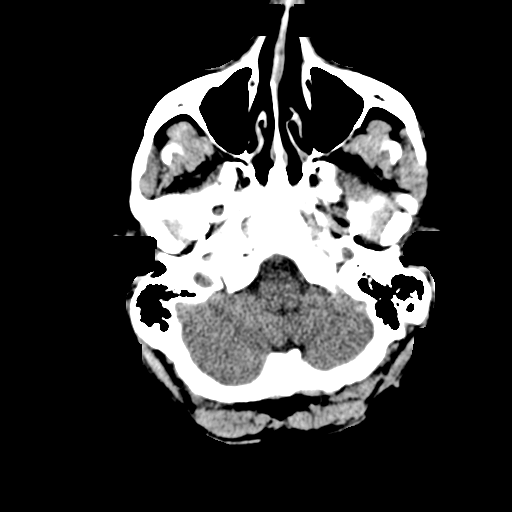
[im 7/36  brain]
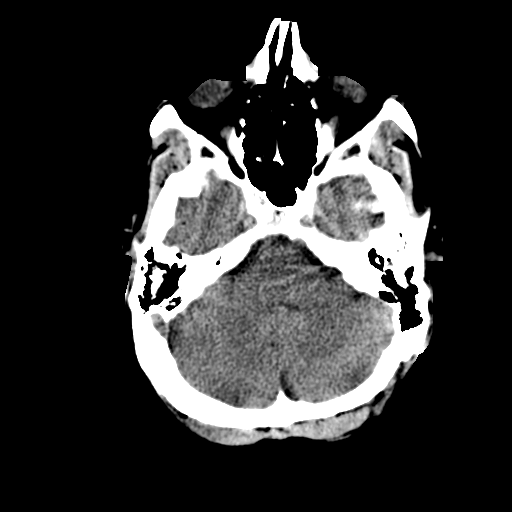
[im 9/36  brain]
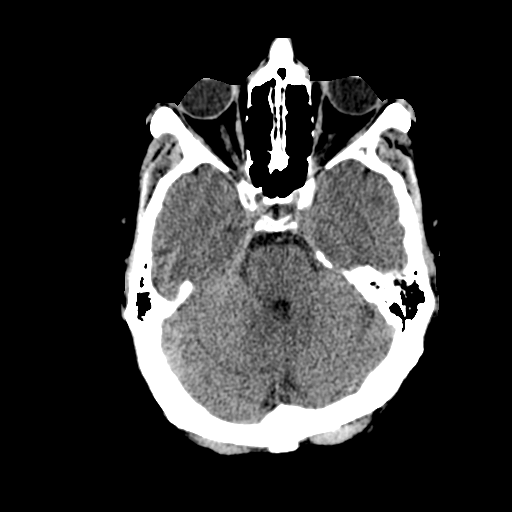
[im 10/36  brain]
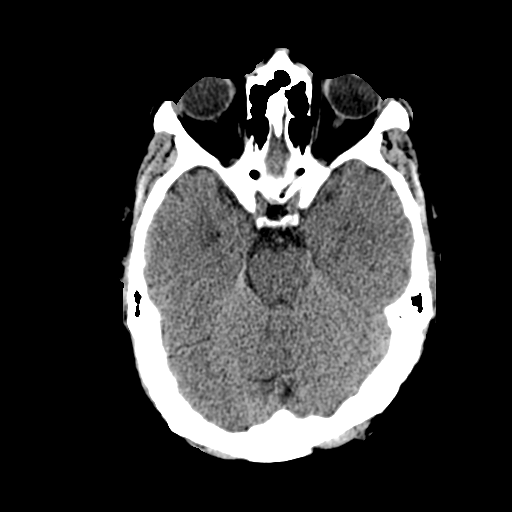
[im 10/36  bone]
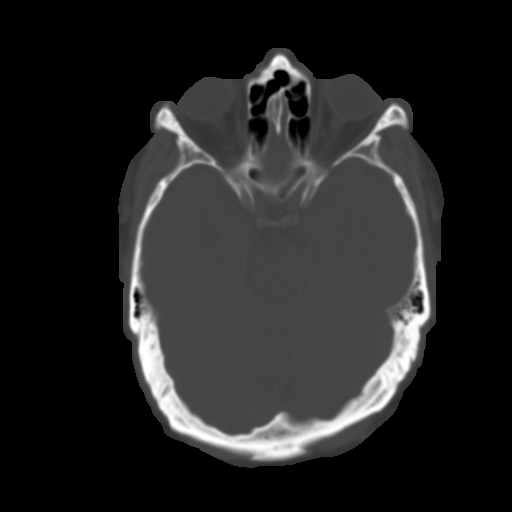
[im 13/36  brain]
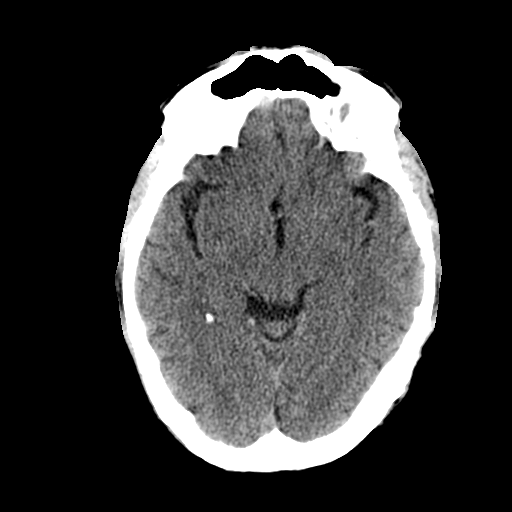
[im 15/36  brain]
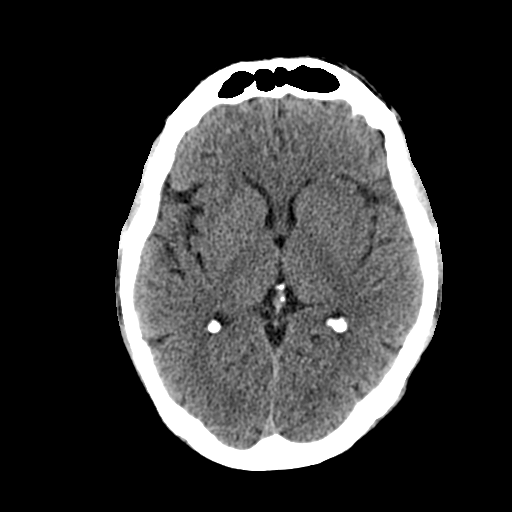
[im 17/36  brain]
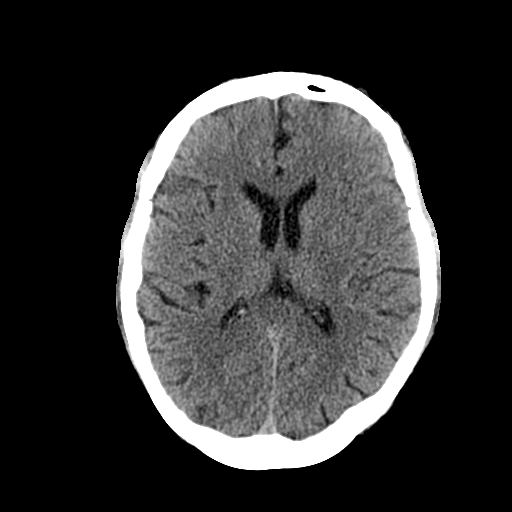
[im 19/36  brain]
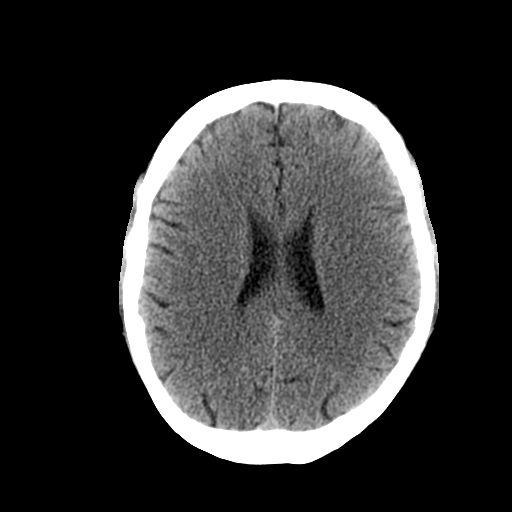
[im 19/36  bone]
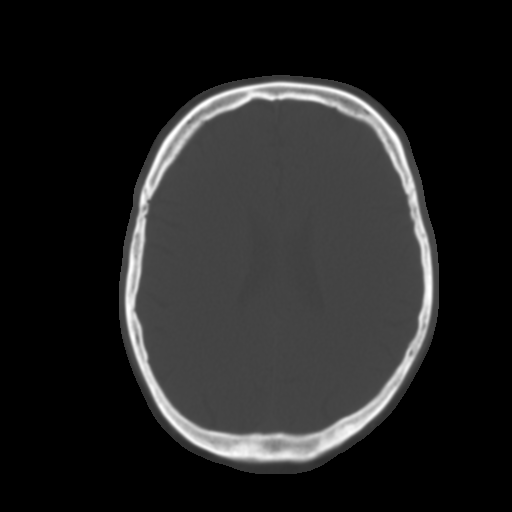
[im 21/36  brain]
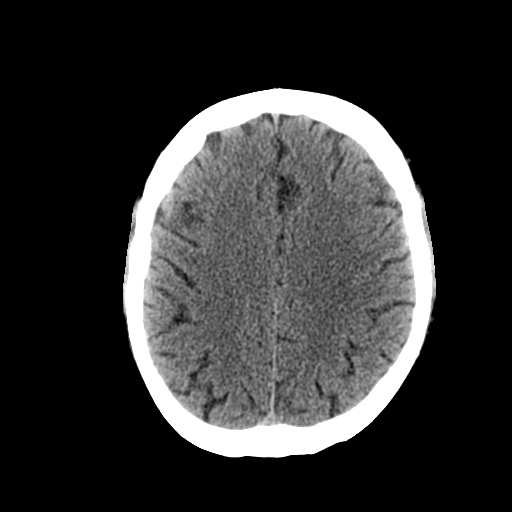
[im 23/36  brain]
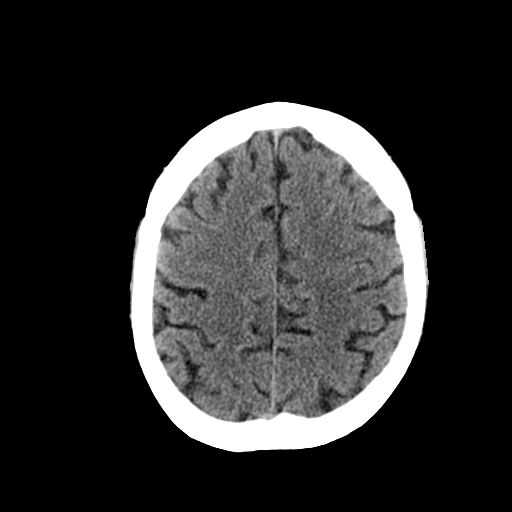
[im 26/36  brain]
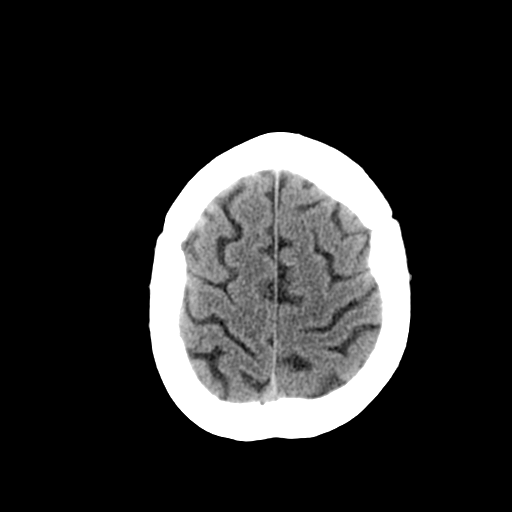
[im 27/36  brain]
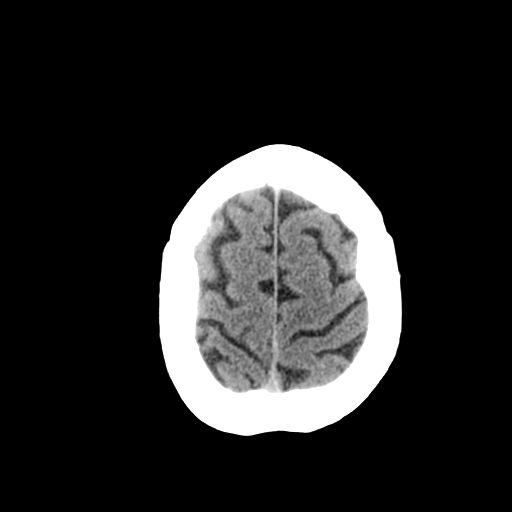
[im 27/36  bone]
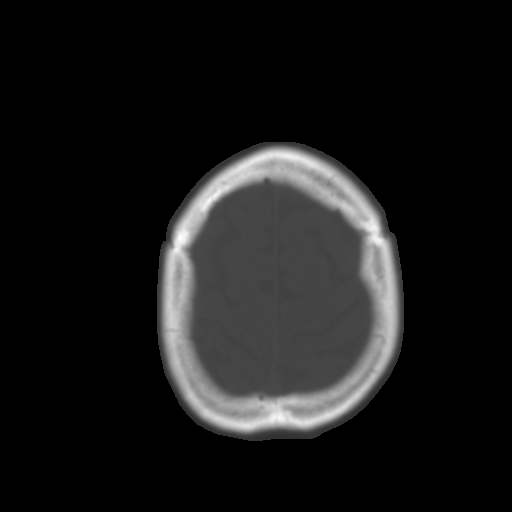
[im 29/36  brain]
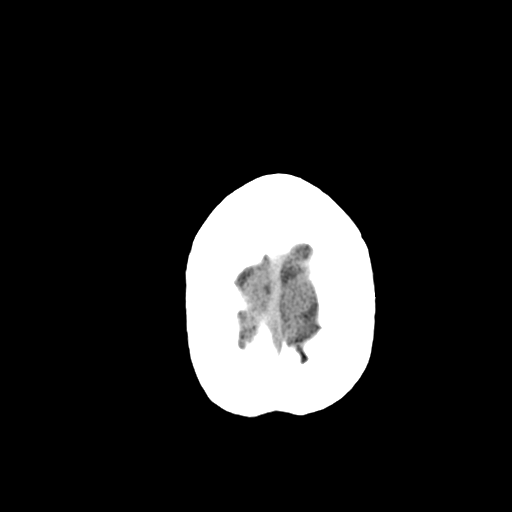
[im 32/36  brain]
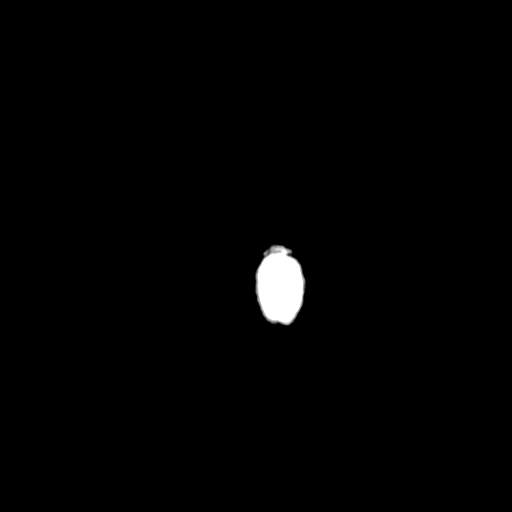
[im 34/36  brain]
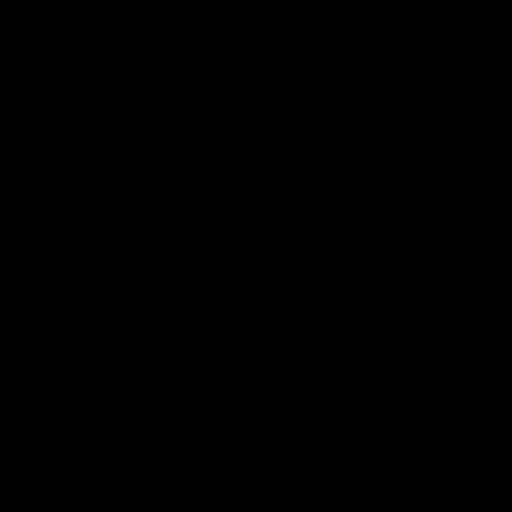

[16 of 30 positions shown; findings below may reference images not displayed]

FINDINGS: No skull fracture or intracranial hemorrhage.

No CT evidence of large acute infarct.

No intracranial mass lesion detected on this unenhanced exam..

No hydrocephalus.

Mastoid air cells, middle ear cavities and visualized paranasal
sinuses are clear.  Orbital structures unremarkable.
IMPRESSION: No acute abnormality.  Please see above.

## 2015-01-24 IMAGING — CR DG CHEST 1V
1 series · 1 of 1 positions shown · non-contrast
Comparison: 03/03/2013

CLINICAL DATA: Multiple rib fractures

CHEST - 1 VIEW

[x chest ap]
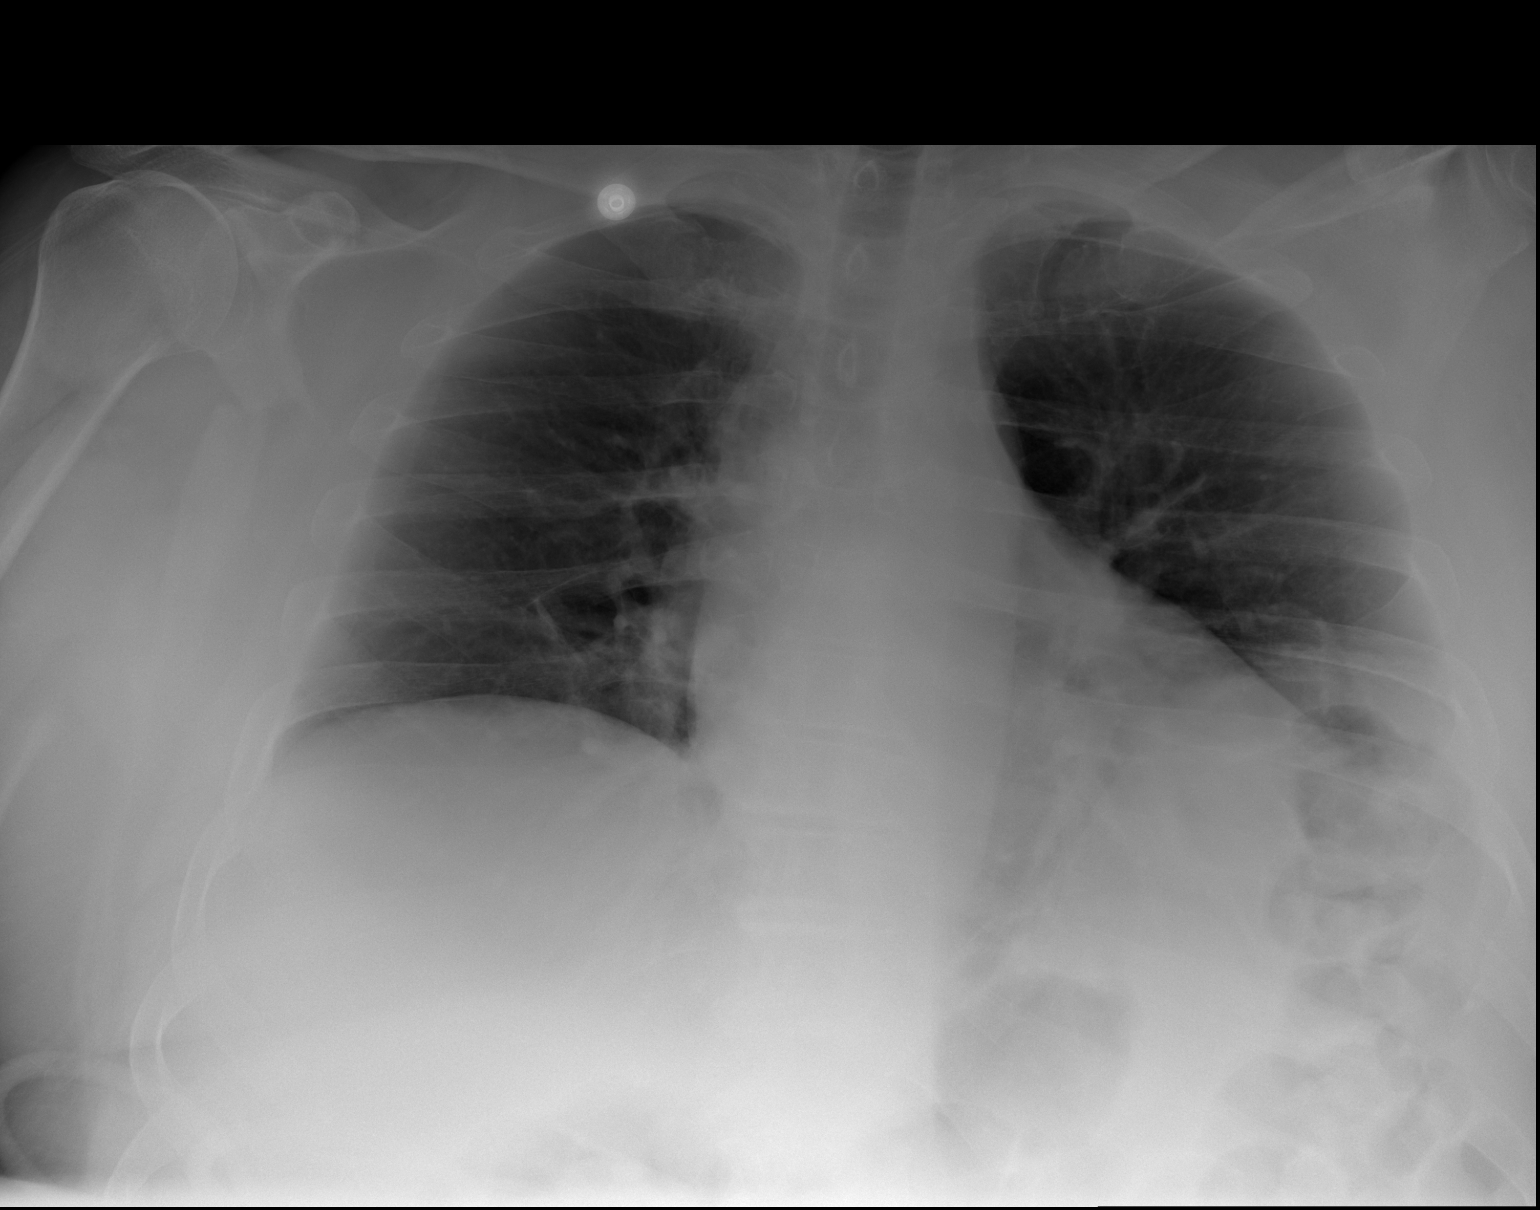

[1 of 1 positions shown; findings below may reference images not displayed]

FINDINGS: Cardiomediastinal silhouette is stable.  No diagnostic
pneumothorax.  Mild left basilar atelectasis or infiltrate.  No
pulmonary edema.  Again noted right scapular fracture.
IMPRESSION: No pulmonary edema.  Mild left basilar atelectasis or infiltrate.
No diagnostic pneumothorax.  Again noted displaced fracture of the
right scapula.

## 2015-04-23 ENCOUNTER — Other Ambulatory Visit: Payer: Self-pay | Admitting: Family Medicine

## 2015-07-28 ENCOUNTER — Encounter: Payer: Self-pay | Admitting: Family Medicine

## 2015-07-28 ENCOUNTER — Ambulatory Visit (INDEPENDENT_AMBULATORY_CARE_PROVIDER_SITE_OTHER): Payer: PPO | Admitting: Family Medicine

## 2015-07-28 VITALS — BP 120/80 | HR 91 | Temp 99.1°F | Ht 68.0 in | Wt 254.9 lb

## 2015-07-28 DIAGNOSIS — Z23 Encounter for immunization: Secondary | ICD-10-CM

## 2015-07-28 DIAGNOSIS — L259 Unspecified contact dermatitis, unspecified cause: Secondary | ICD-10-CM

## 2015-07-28 MED ORDER — PREDNISONE 10 MG PO TABS: ORAL_TABLET | ORAL | Status: DC

## 2015-07-28 NOTE — Addendum Note (Signed)
Addended by: Griselda Miner E on: 07/28/2015 02:50 PM   Modules accepted: Orders

## 2015-07-28 NOTE — Patient Instructions (Signed)

## 2015-07-28 NOTE — Progress Notes (Signed)
Pre visit review using our clinic review tool, if applicable. No additional management support is needed unless otherwise documented below in the visit note. 

## 2015-07-28 NOTE — Progress Notes (Signed)
   Subjective:    Patient ID: Shawn Wood, male    DOB: 1951-05-19, 64 y.o.   MRN: [id number]  HPI Acute visit for left leg rash. Patient has had pruritic rash slightly vesicular which has been oozing some  for about 3 weeks. He's not sure what might have triggered. His wife is concerned he may have "cellulitis ". He's had no fevers or chills. No pain. No history of diabetes. Exacerbated by heat  Past Medical History  Diagnosis Date  . COLONIC POLYPS, HX OF 05/25/2007  . HYPERGLYCEMIA 08/07/2008  . TINEA PEDIS 07/19/2007  . Leg pain right leg  . Varicose veins     history of cellulitis   Past Surgical History  Procedure Laterality Date  . Varicose vein surgery    . Knee surgery  1999    fracture    reports that he has never smoked. He has never used smokeless tobacco. He reports that he drinks alcohol. He reports that he does not use illicit drugs. family history includes Brain cancer in his sister; Breast cancer in his mother; Colon cancer in his father; Prostate cancer in his father; Seizures in his son. No Known Allergies    Review of Systems  Constitutional: Negative for fever and chills.  Skin: Positive for rash.       Objective:   Physical Exam  Constitutional: He appears well-developed and well-nourished. No distress.  Cardiovascular: Normal rate and regular rhythm.   Pulmonary/Chest: Effort normal and breath sounds normal. No respiratory distress. He has no wheezes. He has no rales.  Skin: Rash noted.  Patient has vesicular rash left lower extremity which is oozing in several places is starting to crust over. No cellulitis changes. No tenderness. No warmth.          Assessment & Plan:  Contact dermatitis left leg. No signs of cellulitis. Prednisone taper starting 60 mg daily. Keep clean with soap and water. Follow-up for signs of secondary infection.

## 2015-08-19 ENCOUNTER — Telehealth: Payer: Self-pay | Admitting: Family Medicine

## 2015-08-19 NOTE — Telephone Encounter (Signed)
i recommend follow up with Primary if rash has recurred.

## 2015-08-19 NOTE — Telephone Encounter (Signed)
Pt saw dr Caryl Neverburchette on 07-28-15 and was given prednisone for a rash. Pt is no long on prednisone and rash has returned. Please advise. [facility] on [address]

## 2015-08-19 NOTE — Telephone Encounter (Signed)
Please advise 

## 2015-08-20 NOTE — Telephone Encounter (Signed)
See below

## 2015-08-20 NOTE — Telephone Encounter (Signed)
Pt decline appt. Pt said all he needs is some med

## 2015-08-20 NOTE — Telephone Encounter (Signed)
Mrs. Nelva Bushorma please call pt and schedule for this afternoon per Dr. Durene CalHunter please.

## 2015-08-20 NOTE — Telephone Encounter (Signed)
Can see him today- can work him in this morning or afternoon. Try to call as soon as possible please

## 2015-08-20 NOTE — Telephone Encounter (Signed)
Left message on home phone, patient cell and wife work #for pt to call to sch an appointment

## 2015-08-21 DIAGNOSIS — R319 Hematuria, unspecified: Secondary | ICD-10-CM | POA: Diagnosis not present

## 2015-08-21 DIAGNOSIS — R809 Proteinuria, unspecified: Secondary | ICD-10-CM | POA: Diagnosis not present

## 2015-08-21 DIAGNOSIS — R300 Dysuria: Secondary | ICD-10-CM | POA: Diagnosis not present

## 2015-08-21 NOTE — Telephone Encounter (Signed)
FYI Dr. Hunter  

## 2015-08-21 NOTE — Telephone Encounter (Signed)
Unclear what to give patient without evaluating. Needs visit

## 2015-08-22 ENCOUNTER — Ambulatory Visit (INDEPENDENT_AMBULATORY_CARE_PROVIDER_SITE_OTHER): Payer: PPO | Admitting: Adult Health

## 2015-08-22 ENCOUNTER — Encounter: Payer: Self-pay | Admitting: Adult Health

## 2015-08-22 VITALS — BP 120/80 | Temp 99.5°F | Ht 68.0 in | Wt 250.2 lb

## 2015-08-22 DIAGNOSIS — R300 Dysuria: Secondary | ICD-10-CM

## 2015-08-22 DIAGNOSIS — L259 Unspecified contact dermatitis, unspecified cause: Secondary | ICD-10-CM | POA: Diagnosis not present

## 2015-08-22 DIAGNOSIS — R809 Proteinuria, unspecified: Secondary | ICD-10-CM

## 2015-08-22 LAB — COMPLETE METABOLIC PANEL WITH GFR
ALT: 16 U/L (ref 9–46)
AST: 27 U/L (ref 10–35)
Albumin: 3.8 g/dL (ref 3.6–5.1)
Alkaline Phosphatase: 57 U/L (ref 40–115)
BILIRUBIN TOTAL: 0.8 mg/dL (ref 0.2–1.2)
BUN: 19 mg/dL (ref 7–25)
CO2: 27 mmol/L (ref 20–31)
CREATININE: 0.79 mg/dL (ref 0.70–1.25)
Calcium: 8.8 mg/dL (ref 8.6–10.3)
Chloride: 105 mmol/L (ref 98–110)
GFR, Est Non African American: >89 mL/min (ref >=60)
Glucose, Bld: 113 mg/dL — ABNORMAL HIGH (ref 65–99)
Potassium: 5 mmol/L (ref 3.5–5.3)
Sodium: 140 mmol/L (ref 135–146)
TOTAL PROTEIN: 6.7 g/dL (ref 6.1–8.1)

## 2015-08-22 LAB — POCT URINALYSIS DIPSTICK
Bilirubin, UA: NEGATIVE
Glucose, UA: NEGATIVE
KETONES UA: NEGATIVE
LEUKOCYTES UA: NEGATIVE
NITRITE UA: NEGATIVE
PH UA: 6.5
PROTEIN UA: NEGATIVE
Spec Grav, UA: 1.025
Urobilinogen, UA: 1

## 2015-08-22 LAB — PSA: PSA: 0.68 ng/mL (ref 0.10–4.00)

## 2015-08-22 LAB — SEDIMENTATION RATE: Sed Rate: 10 mm/hr (ref 0–22)

## 2015-08-22 LAB — C-REACTIVE PROTEIN: CRP: 0.4 mg/dL — ABNORMAL LOW (ref 0.5–20.0)

## 2015-08-22 MED ORDER — TRIAMCINOLONE ACETONIDE 0.1 % EX CREA: 1.0000 "application " | TOPICAL_CREAM | Freq: Two times a day (BID) | CUTANEOUS | Status: DC

## 2015-08-22 MED ORDER — PREDNISONE 20 MG PO TABS: 20.0000 mg | ORAL_TABLET | Freq: Every day | ORAL | Status: DC

## 2015-08-22 NOTE — Progress Notes (Signed)
Pre visit review using our clinic review tool, if applicable. No additional management support is needed unless otherwise documented below in the visit note. 

## 2015-08-22 NOTE — Addendum Note (Signed)
Addended by: Nancy FetterNAFZIGER, Adrianah Prophete L on: 08/22/2015 11:35 AM   Modules accepted: SmartSet

## 2015-08-22 NOTE — Progress Notes (Addendum)
Subjective:    Patient ID: Shawn Wood, male    DOB: 10-07-1951, 64 y.o.   MRN: [id number]  HPI  64 year old male who presents to the office for proteinuira. He was seen at [facility] yesterday for frequency, dysuria, and urgency. At urgent care the UA showed no infection but had blood and protein. He has a history of kidney stones but does not feel like he had any yesterday. His symptoms were more consistent with a UTI, he also has a history of these.   He does have a history of proteinuria, see results from 11/16/2014  Yesterday evening around 5 pm all of his UTI type symptoms stopped.   He does endorse taking taking 444m of Ibuprofen every morning for the last two months for hip pain. Did not take this morning.   He does not have a history of HTN or DM.   He continues to have a rash on his left lower leg. Was seen by Dr. BElease Hashimotoon 07/28/2015 and was started on a prednisone taper. He endorses that his rash started to clear up but as soon as he was done with the prednisone taper the rash returned. Denies any pain, just itching.    Review of Systems  Constitutional: Negative.   Respiratory: Negative.   Cardiovascular: Negative.   Genitourinary: Negative.   Musculoskeletal: Negative.   Neurological: Negative.   All other systems reviewed and are negative.  Past Medical History  Diagnosis Date  . COLONIC POLYPS, HX OF 05/25/2007  . HYPERGLYCEMIA 08/07/2008  . TINEA PEDIS 07/19/2007  . Leg pain right leg  . Varicose veins     history of cellulitis    Social History   Social History  . Marital Status: Married    Spouse Name: N/A  . Number of Children: N/A  . Years of Education: N/A   Occupational History  . Not on file.   Social History Main Topics  . Smoking status: Never Smoker   . Smokeless tobacco: Never Used  . Alcohol Use: Yes     Comment: occcassinal beer  . Drug Use: No  . Sexual Activity: Yes   Other Topics Concern  . 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 1 son with epilepsy stays at home and still has seizures. No grandkids.     Past Surgical History  Procedure Laterality Date  . Varicose vein surgery    . Knee surgery  1999    fracture    Family History  Problem Relation Age of Onset  . Breast cancer Mother   . Prostate cancer Father     late 798s . Brain cancer Sister   . Seizures Son   . Colon cancer Father     late 741s did not have colonscopies    No Known Allergies  Current Outpatient Prescriptions on File Prior to Visit  Medication Sig Dispense Refill  . Ascorbic Acid (VITAMIN C) 500 MG tablet Take 500 mg by mouth every evening.     .Marland Kitchenaspirin EC 81 MG tablet Take 81 mg by mouth every evening.     . nystatin-triamcinolone (MYCOLOG II) cream Apply two times a day to both feet for 14 days 30 g 2  .  PARoxetine (PAXIL) 40 MG tablet TAKE ONE TABLET BY MOUTH EVERY DAY 30 tablet 5   No current facility-administered medications on file prior to visit.    BP 120/80 mmHg  Temp(Src) 99.5 F (37.5 C) (Oral)  Ht 5' 8" (1.727 m)  Wt 250 lb 3.2 oz (113.49 kg)  BMI 38.05 kg/m2       Objective:   Physical Exam  Constitutional: He is oriented to person, place, and time.  Cardiovascular: Normal rate, regular rhythm, normal heart sounds and intact distal pulses.  Exam reveals no gallop.   No murmur heard. Pulmonary/Chest: Effort normal and breath sounds normal. No respiratory distress. He has no wheezes. He has no rales. He exhibits no tenderness.  Lymphadenopathy:    He has no cervical adenopathy.  Neurological: He is alert and oriented to person, place, and time.  Skin: Skin is warm and dry. No rash noted. No erythema. No pallor.  Psychiatric: He has a normal mood and affect. His behavior is normal.  Judgment and thought content normal.  Nursing note and vitals reviewed.      Assessment & Plan:  1. Dysuria - POCT urinalysis dipstick; Standing - Negative for protein. +1 blood - POCT urinalysis dipstick - Culture, Urine  2. Proteinuria - May be from chronic Ibuprofen.  - PSA - ANA - C-reactive Protein - Sedimentation Rate - CMP with eGFR - Follow up with Dr. Yong Channel in 2 weeks for repeat UA - He will follow up with his urologist if needed 3. Contact dermatitis - triamcinolone cream (KENALOG) 0.1 %; Apply 1 application topically 2 (two) times daily.  Dispense: 30 g; Refill: 0 - predniSONE (DELTASONE) 20 MG tablet; Take 1 tablet (20 mg total) by mouth daily with breakfast.  Dispense: 9 tablet; Refill: 0

## 2015-08-22 NOTE — Patient Instructions (Signed)
It was great meeting you today!  I have sent in a prescription to the pharmacy for Kenalog Cream and Prednisone  Use the prednisone as directed   Day 1 40 mg Day 2 40 mg Day 3 40 mg Day 4 20 mg Day 5 20 mg Day 6 20 mg  Apply the Kenalog cream morning and night  I will follow up with you regarding your blood work.

## 2015-08-24 LAB — URINE CULTURE
COLONY COUNT: NO GROWTH
ORGANISM ID, BACTERIA: NO GROWTH

## 2015-08-25 LAB — ANA: ANA: NEGATIVE

## 2015-08-25 NOTE — Telephone Encounter (Signed)
Left message for patient to return my call.

## 2015-09-18 ENCOUNTER — Ambulatory Visit: Payer: PPO | Admitting: Family Medicine

## 2015-10-24 DIAGNOSIS — R21 Rash and other nonspecific skin eruption: Secondary | ICD-10-CM | POA: Diagnosis not present

## 2015-10-24 DIAGNOSIS — L500 Allergic urticaria: Secondary | ICD-10-CM | POA: Diagnosis not present

## 2015-10-28 ENCOUNTER — Other Ambulatory Visit: Payer: Self-pay | Admitting: Family Medicine

## 2015-11-07 DIAGNOSIS — L259 Unspecified contact dermatitis, unspecified cause: Secondary | ICD-10-CM | POA: Diagnosis not present

## 2015-11-21 ENCOUNTER — Other Ambulatory Visit (INDEPENDENT_AMBULATORY_CARE_PROVIDER_SITE_OTHER): Payer: PPO

## 2015-11-21 DIAGNOSIS — Z0000 Encounter for general adult medical examination without abnormal findings: Secondary | ICD-10-CM

## 2015-11-21 LAB — CBC WITH DIFFERENTIAL/PLATELET
BASOS ABS: 0 10*3/uL (ref 0.0–0.1)
Basophils Relative: 0.4 % (ref 0.0–3.0)
EOS ABS: 0.2 10*3/uL (ref 0.0–0.7)
Eosinophils Relative: 2.8 % (ref 0.0–5.0)
HCT: 43 % (ref 39.0–52.0)
Hemoglobin: 14.3 g/dL (ref 13.0–17.0)
LYMPHS ABS: 1.9 10*3/uL (ref 0.7–4.0)
Lymphocytes Relative: 26.1 % (ref 12.0–46.0)
MCHC: 33.3 g/dL (ref 30.0–36.0)
MCV: 90.4 fl (ref 78.0–100.0)
MONO ABS: 0.5 10*3/uL (ref 0.1–1.0)
Monocytes Relative: 6.4 % (ref 3.0–12.0)
NEUTROS ABS: 4.7 10*3/uL (ref 1.4–7.7)
NEUTROS PCT: 64.3 % (ref 43.0–77.0)
PLATELETS: 277 10*3/uL (ref 150.0–400.0)
RBC: 4.75 Mil/uL (ref 4.22–5.81)
RDW: 13.7 % (ref 11.5–15.5)
WBC: 7.3 10*3/uL (ref 4.0–10.5)

## 2015-11-21 LAB — HEPATIC FUNCTION PANEL
ALBUMIN: 3.9 g/dL (ref 3.5–5.2)
ALK PHOS: 65 U/L (ref 39–117)
ALT: 14 U/L (ref 0–53)
AST: 15 U/L (ref 0–37)
BILIRUBIN DIRECT: 0.1 mg/dL (ref 0.0–0.3)
BILIRUBIN TOTAL: 0.7 mg/dL (ref 0.2–1.2)
Total Protein: 6.6 g/dL (ref 6.0–8.3)

## 2015-11-21 LAB — BASIC METABOLIC PANEL
BUN: 23 mg/dL (ref 6–23)
CALCIUM: 9 mg/dL (ref 8.4–10.5)
CO2: 29 meq/L (ref 19–32)
CREATININE: 0.94 mg/dL (ref 0.40–1.50)
Chloride: 103 mEq/L (ref 96–112)
GFR: 85.18 mL/min (ref >60.00)
GLUCOSE: 124 mg/dL — AB (ref 70–99)
Potassium: 4.7 mEq/L (ref 3.5–5.1)
Sodium: 139 mEq/L (ref 135–145)

## 2015-11-21 LAB — POCT URINALYSIS DIPSTICK
Bilirubin, UA: NEGATIVE
Glucose, UA: NEGATIVE
Ketones, UA: NEGATIVE
Leukocytes, UA: NEGATIVE
NITRITE UA: NEGATIVE
PROTEIN UA: NEGATIVE
RBC UA: NEGATIVE
SPEC GRAV UA: 1.02
UROBILINOGEN UA: 0.2
pH, UA: 6.5

## 2015-11-21 LAB — PSA: PSA: 0.54 ng/mL (ref 0.10–4.00)

## 2015-11-21 LAB — LIPID PANEL
CHOLESTEROL: 191 mg/dL (ref 0–200)
HDL: 65.6 mg/dL (ref >39.00)
LDL Cholesterol: 114 mg/dL — ABNORMAL HIGH (ref 0–99)
NonHDL: 125.53
TRIGLYCERIDES: 58 mg/dL (ref 0.0–149.0)
Total CHOL/HDL Ratio: 3
VLDL: 11.6 mg/dL (ref 0.0–40.0)

## 2015-11-21 LAB — TSH: TSH: 0.91 u[IU]/mL (ref 0.35–4.50)

## 2015-11-24 ENCOUNTER — Ambulatory Visit (INDEPENDENT_AMBULATORY_CARE_PROVIDER_SITE_OTHER): Payer: PPO | Admitting: Family Medicine

## 2015-11-24 ENCOUNTER — Encounter: Payer: Self-pay | Admitting: Family Medicine

## 2015-11-24 ENCOUNTER — Other Ambulatory Visit (INDEPENDENT_AMBULATORY_CARE_PROVIDER_SITE_OTHER): Payer: PPO

## 2015-11-24 VITALS — BP 128/80 | HR 85 | Temp 98.9°F | Wt 250.0 lb

## 2015-11-24 DIAGNOSIS — L02419 Cutaneous abscess of limb, unspecified: Secondary | ICD-10-CM

## 2015-11-24 DIAGNOSIS — L03119 Cellulitis of unspecified part of limb: Secondary | ICD-10-CM | POA: Diagnosis not present

## 2015-11-24 DIAGNOSIS — E119 Type 2 diabetes mellitus without complications: Secondary | ICD-10-CM | POA: Diagnosis not present

## 2015-11-24 LAB — HEMOGLOBIN A1C: HEMOGLOBIN A1C: 6.2 % (ref 4.6–6.5)

## 2015-11-24 MED ORDER — CEPHALEXIN 500 MG PO CAPS: 500.0000 mg | ORAL_CAPSULE | Freq: Four times a day (QID) | ORAL | Status: DC

## 2015-11-24 NOTE — Assessment & Plan Note (Addendum)
S: Bumped his leg with 70 lb bucket of concrete and developed small nodule on his shin after this. Over next few days, became progressively warm, painful and with surrounding erythema that seems to be spreading. Tried elevating leg some without help. No medication tried. Pain rated as mild to moderate ache worse with pushing on area. History of cellulitis in past on this same shin.  A/P:history of cellulitis in 2011 and 2012. He has had complete resolution for several years so would call this a new case. Suspect may have had some abrasion of skin as entry point when large heavy bucket hit his leg. Treat with keflex 7-10 days.  Suspect nodular are is small hematoma. If this does not improve would consider trying to aspirate the area to make sure no purulence but this really does not look like abscess. Has follow up on Friday but sooner

## 2015-11-24 NOTE — Patient Instructions (Signed)
Seem to have developed cellulitis again on left leg. The bump I am less concerned about- could be traumatic but the spreading redness is the main concern. i do not see an obvious abscess we can drain( looks more like a hematoma potentially)  Take keflex 4x a day until I see you next. If you have worsening redness after 24 hours, fever- you need to be seen sooner. Also advise elevating the leg as possible

## 2015-11-24 NOTE — Progress Notes (Signed)
Tana Conch, MD  Subjective:  RIAAN TOLEDO is a 65 y.o. year old very pleasant male patient who presents for/with See problem oriented charting ROS- no fever, chills, nausea, vomiting. Does have expanding redness and pain over leg  Past Medical History-  Patient Active Problem List   Diagnosis Date Noted  . Hyperlpidemia 06/04/2014    Priority: Medium  . Depression 06/03/2014    Priority: Medium  . Obesity, unspecified 06/03/2014    Priority: Medium  . Hematuria 06/02/2014    Priority: Medium  . Hyperglycemia 04/25/2013    Priority: Medium  . Traumatic compression fracture of thoracic vertebra (HCC) 03/04/2013    Priority: Low    Class: Acute  . Fracture of multiple ribs 03/04/2013    Priority: Low    Class: Acute  . Closed right scapular fracture 03/03/2013    Priority: Low    Class: Acute  . CELLULITIS, LEG, LEFT 11/06/2009    Priority: Low  . COLONIC POLYPS, HX OF 05/25/2007    Priority: Low  . Atypical chest pain 11/07/2014    Medications- reviewed and updated Current Outpatient Prescriptions  Medication Sig Dispense Refill  . Ascorbic Acid (VITAMIN C) 500 MG tablet Take 500 mg by mouth every evening.     Marland Kitchen aspirin EC 81 MG tablet Take 81 mg by mouth every evening.     Marland Kitchen PARoxetine (PAXIL) 40 MG tablet TAKE 1 TABLET BY MOUTH EVERY DAY 30 tablet 5  . cephALEXin (KEFLEX) 500 MG capsule Take 1 capsule (500 mg total) by mouth 4 (four) times daily. 40 capsule 0  . nystatin-triamcinolone (MYCOLOG II) cream Apply two times a day to both feet for 14 days (Patient not taking: Reported on 11/24/2015) 30 g 2  . triamcinolone cream (KENALOG) 0.1 % Apply 1 application topically 2 (two) times daily. (Patient not taking: Reported on 11/24/2015) 30 g 0   No current facility-administered medications for this visit.    Objective: BP 128/80 mmHg  Pulse 85  Temp(Src) 98.9 F (37.2 C)  Wt 250 lb (113.399 kg) Gen: NAD, resting comfortably CV: RRR no murmurs rubs or  gallops Lungs: CTAB no crackles, wheeze, rhonchi Abdomen: soft/nontender/nondistended/normal bowel sounds. No rebound or guarding.  Ext: no edema Skin: warm, dry 2 cm small nodule over left shin, surrounding this is 5-6 cm diameter of erythema. Area is warm to touch throughout and tender as well- swelling in the area Neuro: grossly normal, moves all extremities, intact distal sensation  Assessment/Plan:  CELLULITIS, LEG, LEFT S: Bumped his leg with 70 lb bucket of concrete and developed small nodule on his shin after this. Over next few days, became progressively warm, painful and with surrounding erythema that seems to be spreading. Tried elevating leg some without help. No medication tried. Pain rated as mild to moderate ache worse with pushing on area. History of cellulitis in past on this same shin.  A/P:history of cellulitis in 2011 and 2012. He has had complete resolution for several years so would call this a new case. Suspect may have had some abrasion of skin as entry point when large heavy bucket hit his leg. Treat with keflex 7-10 days. Suspect nodular are is small hematoma. If this does not improve would consider trying to aspirate the area to make sure no purulence but this really does not look like abscess. Has follow up on Friday but sooner Return precautions advised.    Meds ordered this encounter  Medications  . cephALEXin (KEFLEX) 500 MG capsule  Sig: Take 1 capsule (500 mg total) by mouth 4 (four) times daily.    Dispense:  40 capsule    Refill:  0

## 2015-11-28 ENCOUNTER — Ambulatory Visit (INDEPENDENT_AMBULATORY_CARE_PROVIDER_SITE_OTHER): Payer: PPO | Admitting: Family Medicine

## 2015-11-28 ENCOUNTER — Encounter: Payer: Self-pay | Admitting: Family Medicine

## 2015-11-28 VITALS — BP 120/78 | HR 80 | Temp 98.9°F | Ht 68.0 in | Wt 247.0 lb

## 2015-11-28 DIAGNOSIS — Z1211 Encounter for screening for malignant neoplasm of colon: Secondary | ICD-10-CM | POA: Diagnosis not present

## 2015-11-28 DIAGNOSIS — Z0000 Encounter for general adult medical examination without abnormal findings: Secondary | ICD-10-CM | POA: Diagnosis not present

## 2015-11-28 DIAGNOSIS — Z23 Encounter for immunization: Secondary | ICD-10-CM | POA: Diagnosis not present

## 2015-11-28 NOTE — Patient Instructions (Addendum)
Final pneumonia shot received today ([id number])  [facility] will call you about colonoscopy  Weight related concerns: 1. At risk for diabetes (a1c 6.2 where 5.7-6.4 is at risk for diabetes and 6.5 is diabetes).   2. High cholesterol at level I would advise statin if we cannot lose weight and hopefully bring #s down  3. Knee pain- usually better with weight loss  You set a goal of 10 lbs down within 6 months. Definitely lose more if you can. A healthy diet should have at least 5 servings of veggies/fruits combined per day but up to 9 is even better. I like oatmeal for you in the morning. Cut down on peanut butter. Set you a fast food budget of $20 a month and then pack your lunch otherwise

## 2015-11-28 NOTE — Progress Notes (Signed)
Shawn Conch, MD Phone: [phone]  Subjective:  Patient presents today for their annual physical. Chief complaint-noted.   See problem oriented charting- ROS- full  review of systems was completed and negative except for: improving swelling, pain and redness on left lower leg from cellulitis. Negatives: No chest pain or shortness of breath. No headache or blurry vision. Minimal BPH symptoms including rare nocturia  The following were reviewed and entered/updated in epic: Past Medical History  Diagnosis Date  . COLONIC POLYPS, HX OF 05/25/2007  . HYPERGLYCEMIA 08/07/2008  . TINEA PEDIS 07/19/2007  . Leg pain right leg  . Varicose veins     history of cellulitis   Patient Active Problem List   Diagnosis Date Noted  . Hyperlpidemia 06/04/2014    Priority: Medium  . Depression 06/03/2014    Priority: Medium  . Obesity, unspecified 06/03/2014    Priority: Medium  . Hematuria 06/02/2014    Priority: Medium  . Hyperglycemia 04/25/2013    Priority: Medium  . Traumatic compression fracture of thoracic vertebra (HCC) 03/04/2013    Priority: Low    Class: Acute  . Fracture of multiple ribs 03/04/2013    Priority: Low    Class: Acute  . Closed right scapular fracture 03/03/2013    Priority: Low    Class: Acute  . CELLULITIS, LEG, LEFT 11/06/2009    Priority: Low  . COLONIC POLYPS, HX OF 05/25/2007    Priority: Low  . Atypical chest pain 11/07/2014   Past Surgical History  Procedure Laterality Date  . Varicose vein surgery    . Knee surgery  1999    fracture    Family History  Problem Relation Age of Onset  . Breast cancer Mother   . Prostate cancer Father     late 52s  . Brain cancer Sister   . Seizures Son   . Colon cancer Father     late 15s, did not have colonscopies    Medications- reviewed and updated Current Outpatient Prescriptions  Medication Sig Dispense Refill  . aspirin EC 81 MG tablet Take 81 mg by mouth every evening.     . cephALEXin (KEFLEX)  500 MG capsule Take 1 capsule (500 mg total) by mouth 4 (four) times daily. 40 capsule 0  . PARoxetine (PAXIL) 40 MG tablet TAKE 1 TABLET BY MOUTH EVERY DAY 30 tablet 5  . nystatin-triamcinolone (MYCOLOG II) cream Apply two times a day to both feet for 14 days (Patient not taking: Reported on 11/24/2015) 30 g 2  . triamcinolone cream (KENALOG) 0.1 % Apply 1 application topically 2 (two) times daily. (Patient not taking: Reported on 11/24/2015) 30 g 0   No current facility-administered medications for this visit.    Allergies-reviewed and updated No Known Allergies  Social History   Social History  . Marital Status: Married    Spouse Name: N/A  . Number of Children: N/A  . Years of Education: N/A   Social History Main Topics  . Smoking status: Never Smoker   . Smokeless tobacco: Never Used  . Alcohol Use: Yes     Comment: occcassinal beer  . Drug Use: No  . Sexual Activity: Yes   Other Topics Concern  . 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 1 son with epilepsy stays at home and still has seizures. No grandkids.     ROS--See HPI   Objective: BP 120/78 mmHg  Pulse 80  Temp(Src) 98.9 F (37.2 C)  Ht  (1.727 m)  Wt 247 lb (112.038 kg)  BMI 37.56 kg/m2 Gen: NAD, resting comfortably HEENT: Mucous membranes are moist. Oropharynx normal Neck: no thyromegaly CV: RRR no murmurs rubs or gallops Lungs: CTAB no crackles, wheeze, rhonchi Abdomen: soft/nontender/nondistended/normal bowel sounds. No rebound or guarding.  Rectal: normal tone, mild diffusely enlarged prostate, no masses or tenderness Ext: no edema No clear nodule over left shin but slightly raised erythematous area mid shin less painful. Surrounding it is 3-4 cm diameter of erythema  improved from previous. Minimal tenderness to touch and minimal swelling.  Skin: warm, dry Neuro: grossly normal, moves all extremities, PERRLA  Assessment/Plan:  65 y.o. male presenting for annual physical.  Health Maintenance counseling: 1. Anticipatory guidance: Patient counseled regarding regular dental exams, eye exams, wearing seatbelts.  2. Risk factor reduction:  Advised patient of need for regular exercise (walks 20 minutes a night) and diet rich and fruits and vegetables to reduce risk of heart attack and stroke. Weight goal within 6 months of 237.  3. Immunizations/screenings/ancillary studies- prevnar 13 given today Immunization History  Administered Date(s) Administered  . Influenza Split 08/16/2011, 08/22/2012  . Influenza Whole 11/01/2001, 08/16/2007, 08/07/2008, 08/13/2009, 07/23/2010  . Influenza, High Dose Seasonal PF 07/28/2015  . Influenza,inj,Quad PF,36+ Mos 08/15/2013, 08/14/2014  . Pneumococcal Conjugate-13 11/28/2015  . Pneumococcal Polysaccharide-23 06/03/2014  . Tdap 08/07/2008, 03/01/2011, 12/10/2012  . Zoster 03/01/2011   Health Maintenance Due  Topic Date Due  . Hepatitis C Screening - next bloodwork 1950-12-22  . COLONOSCOPY - referred today  j05/31/2016  . PNA vac Low Risk Adult (2 of 2 - PCV13)- today 06/04/2015   4. Prostate cancer screening- low risk based off PSA and rectal. Does have mild BPH on exam but minimal symptoms.  Lab Results  Component Value Date   PSA 0.54 11/21/2015   PSA 0.68 08/22/2015   PSA 0.46 04/18/2013   5. Colon cancer screening - 2011 with 5 year repeat, referred today  We did discuss hyperlipidemia, hyperglycemia and increased risk in relation to his obesity and need for healthy lifestyle changes. We discussed metformin which he declined. Agrees to consider if cannot lose weight and goal is 10 lbs by 6 month follow up with [facility] plans for this  Also his cellulitis has significantly improved- we will continue to  monitor  Return in about 6 months (around 05/27/2016). Return precautions advised.   Orders Placed This Encounter  Procedures  . Pneumococcal conjugate vaccine 13-valent  . Ambulatory referral to Gastroenterology    Referral Priority:  Routine    Referral Type:  Consultation    Referral Reason:  Specialty Services Required    Number of Visits Requested:  1

## 2016-02-05 ENCOUNTER — Other Ambulatory Visit: Payer: Self-pay | Admitting: Adult Health

## 2016-04-06 ENCOUNTER — Ambulatory Visit (INDEPENDENT_AMBULATORY_CARE_PROVIDER_SITE_OTHER): Payer: No Typology Code available for payment source | Admitting: *Deleted

## 2016-04-06 DIAGNOSIS — M722 Plantar fascial fibromatosis: Secondary | ICD-10-CM

## 2016-04-06 DIAGNOSIS — M2140 Flat foot [pes planus] (acquired), unspecified foot: Secondary | ICD-10-CM

## 2016-04-06 NOTE — Progress Notes (Signed)
Patient ID: Shawn CoderJohn H Barb, male   DOB: 03/08/1951, 65 y.o.   MRN: [id number]  Patient presents at Dr. Beverlee Nimsegal's request to be scanned for new orthotics.  Copy current orthotic prescription by [employer] 2D polypro flex pelite top cover with 1.5 poron full length device.  Patient will follow up in 3 weeks for orthotic pick up.

## 2016-04-16 DIAGNOSIS — M25551 Pain in right hip: Secondary | ICD-10-CM | POA: Diagnosis not present

## 2016-04-16 DIAGNOSIS — M25561 Pain in right knee: Secondary | ICD-10-CM | POA: Diagnosis not present

## 2016-04-16 DIAGNOSIS — M545 Low back pain: Secondary | ICD-10-CM | POA: Diagnosis not present

## 2016-04-16 DIAGNOSIS — M25562 Pain in left knee: Secondary | ICD-10-CM | POA: Diagnosis not present

## 2016-04-23 ENCOUNTER — Encounter: Payer: Self-pay | Admitting: *Deleted

## 2016-04-23 NOTE — Progress Notes (Signed)
Patient ID: Shawn CoderJohn H Daino, male   DOB: [birth year], 65 y.o.   MRN: [id number] Patients wife came in to pick up his orthotics since this is not his first pair.  Reviewed break in instructions and gave her a card to contact us if he has any questions.

## 2016-05-05 ENCOUNTER — Other Ambulatory Visit: Payer: Self-pay | Admitting: Family Medicine

## 2016-05-06 NOTE — Telephone Encounter (Signed)
Yes thanks 

## 2016-05-06 NOTE — Telephone Encounter (Signed)
Rx refill sent to pharmacy. 

## 2016-05-10 DIAGNOSIS — H5213 Myopia, bilateral: Secondary | ICD-10-CM | POA: Diagnosis not present

## 2016-05-10 DIAGNOSIS — H2513 Age-related nuclear cataract, bilateral: Secondary | ICD-10-CM | POA: Diagnosis not present

## 2016-05-10 DIAGNOSIS — H25013 Cortical age-related cataract, bilateral: Secondary | ICD-10-CM | POA: Diagnosis not present

## 2016-05-31 ENCOUNTER — Other Ambulatory Visit: Payer: Self-pay | Admitting: Family Medicine

## 2016-05-31 NOTE — Telephone Encounter (Signed)
Pt is requesting a refill, last filled on 05/06/16, last OV 1/27. Ok to refill?

## 2016-06-01 NOTE — Telephone Encounter (Signed)
Yes thanks, may fill 

## 2016-06-08 DIAGNOSIS — M25551 Pain in right hip: Secondary | ICD-10-CM | POA: Diagnosis not present

## 2016-06-08 DIAGNOSIS — M25561 Pain in right knee: Secondary | ICD-10-CM | POA: Diagnosis not present

## 2016-06-08 DIAGNOSIS — M25562 Pain in left knee: Secondary | ICD-10-CM | POA: Diagnosis not present

## 2016-07-27 DIAGNOSIS — M25562 Pain in left knee: Secondary | ICD-10-CM | POA: Diagnosis not present

## 2016-07-27 DIAGNOSIS — M25551 Pain in right hip: Secondary | ICD-10-CM | POA: Diagnosis not present

## 2016-07-27 DIAGNOSIS — M25561 Pain in right knee: Secondary | ICD-10-CM | POA: Diagnosis not present

## 2016-11-11 ENCOUNTER — Telehealth (INDEPENDENT_AMBULATORY_CARE_PROVIDER_SITE_OTHER): Payer: Self-pay | Admitting: Orthopaedic Surgery

## 2016-11-11 NOTE — Telephone Encounter (Signed)
Ok

## 2016-11-11 NOTE — Telephone Encounter (Signed)
Patient's wife (Shawn Wood) called advised Mr. Shawn Wood needed to know if he is going to get the gel injection in his knees when he sees Dr Ophelia CharterYates on 11/24/16. The number to contact patient is [phone]

## 2016-11-11 NOTE — Telephone Encounter (Signed)
Per office note in August, Shawn Wood spoke with patient about viscosupplementation. Would you like for me to try and get approval? For both knees? Please advise.

## 2016-11-12 NOTE — Telephone Encounter (Signed)
I spoke with patient. He states that there has been no change in his insurance information. I advised that I would have to get authorization from his insurance for gel injections. Information submitted to [employer].

## 2016-11-15 ENCOUNTER — Other Ambulatory Visit: Payer: Self-pay | Admitting: Family Medicine

## 2016-11-22 NOTE — Telephone Encounter (Signed)
Submitted form to [employer]. He has to receive prior authorization from insurance. I called insurance and they advised me to go onto [employer] website and print off form and fill out and fax in. Form was printed and faxed. Awaiting authorization.

## 2016-11-23 ENCOUNTER — Telehealth (INDEPENDENT_AMBULATORY_CARE_PROVIDER_SITE_OTHER): Payer: Self-pay | Admitting: *Deleted

## 2016-11-23 NOTE — Telephone Encounter (Signed)
I left voicemail for patient's wife. When patient called me on 1/11, he stated that he wanted [employer] injections. I had to make sure that there was no change in insurance information, and get Dr. Ophelia CharterYates ok prior to sending in requisition to [employer]. Once this was taken care of, requisition was submitted. It came back with patient's coverage information but stated patient needed prior authorization due to having [employer]. I attempted to call and get authorization and was told I had to go onto their website, print a form, complete it, and fax it in. All of this has been done. I am still awaiting authorization from insurance. We cannot proceed without that, or patient can be billed in full. I asked her to please return call if she has further questions.

## 2016-11-23 NOTE — Telephone Encounter (Signed)
Pt wife came in asking about the [employer] inj. Stating pt does not need to come in for his appt tomorrow unless this was going to be done. Pt wife asking for a call back. [phone].

## 2016-11-24 ENCOUNTER — Ambulatory Visit (INDEPENDENT_AMBULATORY_CARE_PROVIDER_SITE_OTHER): Payer: PPO | Admitting: Orthopaedic Surgery

## 2016-11-24 ENCOUNTER — Encounter (INDEPENDENT_AMBULATORY_CARE_PROVIDER_SITE_OTHER): Payer: Self-pay | Admitting: Orthopaedic Surgery

## 2016-11-24 VITALS — BP 124/79 | HR 70 | Ht 68.0 in | Wt 247.0 lb

## 2016-11-24 DIAGNOSIS — M1712 Unilateral primary osteoarthritis, left knee: Secondary | ICD-10-CM

## 2016-11-24 DIAGNOSIS — M1711 Unilateral primary osteoarthritis, right knee: Secondary | ICD-10-CM | POA: Diagnosis not present

## 2016-11-24 MED ORDER — LIDOCAINE HCL 1 % IJ SOLN
1.0000 mL | INTRAMUSCULAR | Status: AC | PRN
  Administered 2016-11-24: 1 mL

## 2016-11-24 MED ORDER — BUPIVACAINE HCL 0.25 % IJ SOLN
0.6600 mL | INTRAMUSCULAR | Status: AC | PRN
  Administered 2016-11-24: .66 mL via INTRA_ARTICULAR

## 2016-11-24 MED ORDER — METHYLPREDNISOLONE ACETATE 40 MG/ML IJ SUSP
40.0000 mg | INTRAMUSCULAR | Status: AC | PRN
  Administered 2016-11-24: 40 mg via INTRA_ARTICULAR

## 2016-11-24 NOTE — Progress Notes (Signed)
Office Visit Note   Patient: Shawn Wood           Date of Birth: Sep 24, 1951           MRN: [id number] Visit Date: 11/24/2016              Requested by: Shelva Majestic, MD [address] [place], [place] [address] PCP: Tana Conch, MD   Assessment & Plan: Visit Diagnoses:  1. Unilateral primary osteoarthritis, left knee   2. Unilateral primary osteoarthritis, right knee     Plan: Bilateral knee injections performed with cortisone due to the patient's increasing pain symptoms. He will return for the viscous supplement injection once it's approved. Follow-Up Instructions: Return if symptoms worsen or fail to improve.   Orders:  Orders Placed This Encounter  Procedures  . Large Joint Injection/Arthrocentesis  . Large Joint Injection/Arthrocentesis   No orders of the defined types were placed in this encounter.     Procedures: Large Joint Inj Date/Time: 11/24/2016 10:22 AM Performed by: Eldred Manges Authorized by: Eldred Manges   Consent Given by:  Patient Indications:  Pain and joint swelling Location:  Knee Site:  L knee Needle Size:  22 G Needle Length:  1.5 inches Approach:  Anterolateral Ultrasound Guidance: No   Fluoroscopic Guidance: No   Arthrogram: No   Medications:  1 mL lidocaine 1 %; 40 mg methylPREDNISolone acetate 40 MG/ML; 0.66 mL bupivacaine 0.25 % Aspiration Attempted: No   Patient tolerance:  Patient tolerated the procedure well with no immediate complications Large Joint Inj Date/Time: 11/24/2016 10:23 AM Performed by: Eldred Manges Authorized by: Annell Greening C   Consent Given by:  Patient Indications:  Pain and joint swelling Location:  Knee Site:  L knee Needle Size:  22 G Needle Length:  1.5 inches Approach:  Anterolateral Ultrasound Guidance: No   Fluoroscopic Guidance: No   Arthrogram: No   Medications:  1 mL lidocaine 1 %; 40 mg methylPREDNISolone acetate 40 MG/ML; 0.66 mL bupivacaine 0.25 % Aspiration Attempted: No     Patient tolerance:  Patient tolerated the procedure well with no immediate complications     Clinical Data: No additional findings.   Subjective: No chief complaint on file.   Patient is here to have bilateral knee injections (cort.).  Would like to have a right hip injection?  States knee pain is coming from right hip.    Review of Systems  Constitutional: Negative for chills and diaphoresis.  HENT: Negative for ear discharge, ear pain and nosebleeds.   Eyes: Negative for discharge and visual disturbance.  Respiratory: Negative for cough, choking and shortness of breath.   Cardiovascular: Negative for chest pain and palpitations.  Gastrointestinal: Negative for abdominal distention and abdominal pain.  Endocrine: Negative for cold intolerance and heat intolerance.  Genitourinary: Negative for flank pain and hematuria.  Musculoskeletal:       Bilateral knee chondromalacia symptomatic. He is waiting on model risk injections of his knee and once there proved to her return.  Skin: Negative for rash and wound.  Neurological: Negative for seizures and speech difficulty.  Hematological: Negative for adenopathy. Does not bruise/bleed easily.  Psychiatric/Behavioral: Negative for agitation and suicidal ideas.     Objective: Vital Signs: BP 124/79   Pulse 70   Ht 5\' 8"  (1.727 m)   Wt 247 lb (112 kg)   BMI 37.56 kg/m   Physical Exam  Constitutional: He is oriented to person, place, and time. He appears  well-developed and well-nourished.  HENT:  Head: Normocephalic and atraumatic.  Eyes: EOM are normal. Pupils are equal, round, and reactive to light.  Neck: No tracheal deviation present. No thyromegaly present.  Cardiovascular: Normal rate.   Pulmonary/Chest: Effort normal. He has no wheezes.  Abdominal: Soft. Bowel sounds are normal.  Musculoskeletal:  Well-healed lateral incision from ORIF lateral tibial plateau left knee. 2+ synovitis both knees. Pulses are normal  normal hip range of motion.  Neurological: He is alert and oriented to person, place, and time.  Skin: Skin is warm and dry. Capillary refill takes less than 2 seconds.  Psychiatric: He has a normal mood and affect. His behavior is normal. Judgment and thought content normal.    Ortho Exam  Specialty Comments:  No specialty comments available.  Imaging: No results found.   PMFS History: Patient Active Problem List   Diagnosis Date Noted  . Unilateral primary osteoarthritis, right knee 11/24/2016  . Atypical chest pain 11/07/2014  . Hyperlpidemia 06/04/2014  . Depression 06/03/2014  . Obesity, unspecified 06/03/2014  . Hematuria 06/02/2014  . Hyperglycemia 04/25/2013  . Traumatic compression fracture of thoracic vertebra (HCC) 03/04/2013    Class: Acute  . Fracture of multiple ribs 03/04/2013    Class: Acute  . Closed right scapular fracture 03/03/2013    Class: Acute  . CELLULITIS, LEG, LEFT 11/06/2009  . COLONIC POLYPS, HX OF 05/25/2007   Past Medical History:  Diagnosis Date  . COLONIC POLYPS, HX OF 05/25/2007  . HYPERGLYCEMIA 08/07/2008  . Leg pain right leg  . TINEA PEDIS 07/19/2007  . Varicose veins    history of cellulitis    Family History  Problem Relation Age of Onset  . Breast cancer Mother   . Prostate cancer Father     late 59s  . Brain cancer Sister   . Seizures Son   . Colon cancer Father     late 63s, did not have colonscopies    Past Surgical History:  Procedure Laterality Date  . KNEE SURGERY  1999   fracture  . VARICOSE VEIN SURGERY     Social History   Occupational History  . Not on file.   Social History Main Topics  . Smoking status: Never Smoker  . Smokeless tobacco: Never Used  . Alcohol use Yes     Comment: occcassinal beer  . Drug use: No  . Sexual activity: Yes

## 2016-12-03 ENCOUNTER — Telehealth (INDEPENDENT_AMBULATORY_CARE_PROVIDER_SITE_OTHER): Payer: Self-pay | Admitting: Orthopaedic Surgery

## 2016-12-03 NOTE — Telephone Encounter (Signed)
I called patient. I have spoken with Shawn Wood S at [facility] at [phone]. She states injections are authorized and gave authorization number [id number]. Authorization good from 11/22/16-02/20/17.  Patient scheduled appt on 12/15/16 with [name] for bilat knee injections,.

## 2016-12-03 NOTE — Telephone Encounter (Signed)
Authorization from GladePia S at [facility] #[id number]  Good from 11/22/16-02/20/17. Patient has appt 12/15/16 for injections.

## 2016-12-03 NOTE — Telephone Encounter (Signed)
Patient's wife (brenda)  called asked if Shawn Wood can get a call back concerning the gel injection for his knee. The number to contact Shawn Wood is [phone]

## 2016-12-08 ENCOUNTER — Telehealth (INDEPENDENT_AMBULATORY_CARE_PROVIDER_SITE_OTHER): Payer: Self-pay | Admitting: Orthopaedic Surgery

## 2016-12-08 NOTE — Telephone Encounter (Signed)
Patients wife called concerning her husbands gel shots, wondering how much the copay will be? CB # [phone]

## 2016-12-09 ENCOUNTER — Other Ambulatory Visit (INDEPENDENT_AMBULATORY_CARE_PROVIDER_SITE_OTHER): Payer: PPO

## 2016-12-09 DIAGNOSIS — Z0000 Encounter for general adult medical examination without abnormal findings: Secondary | ICD-10-CM | POA: Diagnosis not present

## 2016-12-09 LAB — BASIC METABOLIC PANEL
BUN: 24 mg/dL — AB (ref 6–23)
CALCIUM: 9.4 mg/dL (ref 8.4–10.5)
CO2: 32 mEq/L (ref 19–32)
Chloride: 105 mEq/L (ref 96–112)
Creatinine, Ser: 0.94 mg/dL (ref 0.40–1.50)
GFR: 84.91 mL/min (ref >60.00)
GLUCOSE: 116 mg/dL — AB (ref 70–99)
Potassium: 4.9 mEq/L (ref 3.5–5.1)
Sodium: 141 mEq/L (ref 135–145)

## 2016-12-09 LAB — CBC WITH DIFFERENTIAL/PLATELET
BASOS ABS: 0 10*3/uL (ref 0.0–0.1)
Basophils Relative: 0.9 % (ref 0.0–3.0)
Eosinophils Absolute: 0.2 10*3/uL (ref 0.0–0.7)
Eosinophils Relative: 3.4 % (ref 0.0–5.0)
HCT: 42.1 % (ref 39.0–52.0)
Hemoglobin: 14.2 g/dL (ref 13.0–17.0)
LYMPHS ABS: 1.4 10*3/uL (ref 0.7–4.0)
Lymphocytes Relative: 27 % (ref 12.0–46.0)
MCHC: 33.7 g/dL (ref 30.0–36.0)
MCV: 90.3 fl (ref 78.0–100.0)
MONO ABS: 0.5 10*3/uL (ref 0.1–1.0)
Monocytes Relative: 9 % (ref 3.0–12.0)
NEUTROS PCT: 59.7 % (ref 43.0–77.0)
Neutro Abs: 3.2 10*3/uL (ref 1.4–7.7)
PLATELETS: 273 10*3/uL (ref 150.0–400.0)
RBC: 4.66 Mil/uL (ref 4.22–5.81)
RDW: 13.1 % (ref 11.5–15.5)
WBC: 5.4 10*3/uL (ref 4.0–10.5)

## 2016-12-09 LAB — LIPID PANEL
CHOLESTEROL: 169 mg/dL (ref 0–200)
HDL: 54.4 mg/dL (ref >39.00)
LDL CALC: 103 mg/dL — AB (ref 0–99)
NonHDL: 114.38
Total CHOL/HDL Ratio: 3
Triglycerides: 59 mg/dL (ref 0.0–149.0)
VLDL: 11.8 mg/dL (ref 0.0–40.0)

## 2016-12-09 LAB — POC URINALSYSI DIPSTICK (AUTOMATED)
BILIRUBIN UA: NEGATIVE
GLUCOSE UA: NEGATIVE
Ketones, UA: NEGATIVE
Leukocytes, UA: NEGATIVE
NITRITE UA: NEGATIVE
Protein, UA: NEGATIVE
RBC UA: NEGATIVE
Spec Grav, UA: 1.025
Urobilinogen, UA: 1
pH, UA: 6.5

## 2016-12-09 LAB — PSA: PSA: 0.55 ng/mL (ref 0.10–4.00)

## 2016-12-09 LAB — HEPATIC FUNCTION PANEL
ALT: 11 U/L (ref 0–53)
AST: 15 U/L (ref 0–37)
Albumin: 4 g/dL (ref 3.5–5.2)
Alkaline Phosphatase: 65 U/L (ref 39–117)
BILIRUBIN TOTAL: 0.6 mg/dL (ref 0.2–1.2)
Bilirubin, Direct: 0.1 mg/dL (ref 0.0–0.3)
Total Protein: 6.6 g/dL (ref 6.0–8.3)

## 2016-12-09 LAB — TSH: TSH: 1.09 u[IU]/mL (ref 0.35–4.50)

## 2016-12-09 NOTE — Telephone Encounter (Signed)
I left voicemail on wife's answering machine. Per the monovisc paperwork, it looks like $20.00 copay for specialist office visit still applies. If he has met deductible, it goes to 100% coverage and no deductible. I am not sure if patient has met his deductible or not. I did explain that she could call the insurance and they could let her know about that.

## 2016-12-15 ENCOUNTER — Encounter (INDEPENDENT_AMBULATORY_CARE_PROVIDER_SITE_OTHER): Payer: Self-pay | Admitting: Surgery

## 2016-12-15 ENCOUNTER — Ambulatory Visit (INDEPENDENT_AMBULATORY_CARE_PROVIDER_SITE_OTHER): Payer: PPO | Admitting: Surgery

## 2016-12-15 VITALS — BP 137/88 | HR 82

## 2016-12-15 DIAGNOSIS — M1711 Unilateral primary osteoarthritis, right knee: Secondary | ICD-10-CM | POA: Diagnosis not present

## 2016-12-15 DIAGNOSIS — M1712 Unilateral primary osteoarthritis, left knee: Secondary | ICD-10-CM | POA: Diagnosis not present

## 2016-12-15 NOTE — Progress Notes (Signed)
Office Visit Note   Patient: Shawn Wood           Date of Birth: 1951-05-05           MRN: [id number] Visit Date: 12/15/2016              Requested by: Shelva Majestic, MD [address] [place], [place] [address] PCP: Tana Conch, MD   Assessment & Plan: Visit Diagnoses:  1. Arthritis of knee, left   2. Arthritis of right knee     Plan: To have the patient sent bilateral knee model disc injections were done. We'll follow-up the office in 6 weeks for recheck to check his response to today's injections. Use ice off and on as needed. Return to the clinic sooner if needed.  Follow-Up Instructions: Return in about 6 weeks (around 01/26/2017) for Recheck of bilateral knees.   Orders:  No orders of the defined types were placed in this encounter.  No orders of the defined types were placed in this encounter.     Procedures: No procedures performed   Clinical Data: No additional findings.   Subjective: Chief Complaint  Patient presents with  . Right Knee - Follow-up  . Left Knee - Follow-up    Shawn Wood is here for bilateral knee injections for monovisc.  States he continues to have pain in both knees. Bilateral knee x-ray showed DJD.  Review of Systems  Constitutional: Negative.   Musculoskeletal: Positive for joint swelling.  Skin: Negative.   Psychiatric/Behavioral: Negative.      Objective: Vital Signs: BP 137/88   Pulse 82   Physical Exam  Constitutional: He is oriented to person, place, and time. No distress.  HENT:  Head: Normocephalic and atraumatic.  Eyes: EOM are normal. Pupils are equal, round, and reactive to light.  Pulmonary/Chest: No respiratory distress.  Musculoskeletal:  Bilateral knees does have some crepitus. Joint line is tender.  Neurological: He is alert and oriented to person, place, and time.  Skin: Skin is warm and dry.    Ortho Exam  Specialty Comments:  No specialty comments available.  Imaging: No results  found.   PMFS History: Patient Active Problem List   Diagnosis Date Noted  . Unilateral primary osteoarthritis, right knee 11/24/2016  . Atypical chest pain 11/07/2014  . Hyperlpidemia 06/04/2014  . Depression 06/03/2014  . Obesity, unspecified 06/03/2014  . Hematuria 06/02/2014  . Hyperglycemia 04/25/2013  . Traumatic compression fracture of thoracic vertebra (HCC) 03/04/2013    Class: Acute  . Fracture of multiple ribs 03/04/2013    Class: Acute  . Closed right scapular fracture 03/03/2013    Class: Acute  . CELLULITIS, LEG, LEFT 11/06/2009  . COLONIC POLYPS, HX OF 05/25/2007   Past Medical History:  Diagnosis Date  . COLONIC POLYPS, HX OF 05/25/2007  . HYPERGLYCEMIA 08/07/2008  . Leg pain right leg  . TINEA PEDIS 07/19/2007  . Varicose veins    history of cellulitis    Family History  Problem Relation Age of Onset  . Breast cancer Mother   . Prostate cancer Father     late 27s  . Brain cancer Sister   . Seizures Son   . Colon cancer Father     late 37s, did not have colonscopies    Past Surgical History:  Procedure Laterality Date  . KNEE SURGERY  1999   fracture  . VARICOSE VEIN SURGERY     Social History   Occupational History  . Not  on file.   Social History Main Topics  . Smoking status: Never Smoker  . Smokeless tobacco: Never Used  . Alcohol use Yes     Comment: occcassinal beer  . Drug use: No  . Sexual activity: Yes

## 2016-12-16 ENCOUNTER — Encounter: Payer: Self-pay | Admitting: Family Medicine

## 2016-12-16 ENCOUNTER — Ambulatory Visit (INDEPENDENT_AMBULATORY_CARE_PROVIDER_SITE_OTHER): Payer: PPO | Admitting: Family Medicine

## 2016-12-16 VITALS — BP 136/84 | HR 82 | Temp 98.9°F | Ht 67.25 in | Wt 244.2 lb

## 2016-12-16 DIAGNOSIS — F325 Major depressive disorder, single episode, in full remission: Secondary | ICD-10-CM

## 2016-12-16 DIAGNOSIS — Z0000 Encounter for general adult medical examination without abnormal findings: Secondary | ICD-10-CM

## 2016-12-16 NOTE — Assessment & Plan Note (Signed)
well controlled on paxil 40mg . phq2 of 0

## 2016-12-16 NOTE — Progress Notes (Signed)
Phone: [phone]  Subjective:  Patient presents today for their annual physical. Chief complaint-noted.   See problem oriented charting- ROS- full  review of systems was completed and negative including No chest pain or shortness of breath. No headache or blurry vision.   The following were reviewed and entered/updated in epic: Past Medical History:  Diagnosis Date  . COLONIC POLYPS, HX OF 05/25/2007  . HYPERGLYCEMIA 08/07/2008  . Leg pain right leg  . TINEA PEDIS 07/19/2007  . Varicose veins    history of cellulitis   Patient Active Problem List   Diagnosis Date Noted  . Atypical chest pain 11/07/2014    Priority: Medium  . Hyperlpidemia 06/04/2014    Priority: Medium  . Depression 06/03/2014    Priority: Medium  . Obesity, unspecified 06/03/2014    Priority: Medium  . Hematuria 06/02/2014    Priority: Medium  . Hyperglycemia 04/25/2013    Priority: Medium  . Unilateral primary osteoarthritis, right knee 11/24/2016    Priority: Low  . Traumatic compression fracture of thoracic vertebra (HCC) 03/04/2013    Priority: Low    Class: Acute  . Fracture of multiple ribs 03/04/2013    Priority: Low    Class: Acute  . Closed right scapular fracture 03/03/2013    Priority: Low    Class: Acute  . CELLULITIS, LEG, LEFT 11/06/2009    Priority: Low  . COLONIC POLYPS, HX OF 05/25/2007    Priority: Low   Past Surgical History:  Procedure Laterality Date  . KNEE SURGERY  1999   fracture  . VARICOSE VEIN SURGERY      Family History  Problem Relation Age of Onset  . Breast cancer Mother   . Prostate cancer Father     late 23s  . Brain cancer Sister   . Seizures Son   . Colon cancer Father     late 73s, did not have colonscopies    Medications- reviewed and updated Current Outpatient Prescriptions  Medication Sig Dispense Refill  . aspirin EC 81 MG tablet Take 81 mg by mouth every evening.     . nystatin-triamcinolone (MYCOLOG II) cream Apply two times a day to  both feet for 14 days 30 g 2  . PARoxetine (PAXIL) 40 MG tablet TAKE 1 TABLET BY MOUTH EVERY DAY 30 tablet 5   Allergies-reviewed and updated No Known Allergies  Social History   Social History  . Marital status: Married    Spouse name: N/A  . Number of children: N/A  . Years of education: N/A   Social History Main Topics  . Smoking status: Never Smoker  . Smokeless tobacco: Never Used  . Alcohol use Yes     Comment: occcassinal beer  . Drug use: No  . Sexual activity: Yes   Other Topics Concern  . None   Social History Narrative   Married 41 years (wife Charity fundraiser at [employer]) 2015 with 2 sons. 1 son with epilepsy stays at home and still has seizures. No grandkids.       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           Objective: BP 136/84 (BP Location: Left Arm, Patient Position: Sitting, Cuff Size: Large)  Pulse 82   Temp 98.9 F (37.2 C) (Oral)   Ht 5' 7.25" (1.708 m)   Wt 244 lb 3.2 oz (110.8 kg)   SpO2 92%   BMI 37.96 kg/m  Gen: NAD, resting comfortably HEENT: Mucous membranes are moist. Oropharynx normal. Some cerumen bilaterally Neck: no thyromegaly CV: RRR no murmurs rubs or gallops Lungs: CTAB no crackles, wheeze, rhonchi Abdomen: soft/nontender/nondistended/normal bowel sounds. No rebound or guarding.  Ext: no edema Skin: warm, dry Neuro: grossly normal, moves all extremities, PERRLA Rectal: normal tone, normal sized prostate, no masses or tenderness  Assessment/Plan:  66 y.o. male presenting for annual physical.  Health Maintenance counseling: 1. Anticipatory guidance: Patient counseled regarding regular dental exams q6 months, eye exams - yearly, wearing seatbelts.  2. Risk factor reduction:  Advised  patient of need for regular exercise and diet rich and fruits and vegetables to reduce risk of heart attack and stroke. Exercise- active with work, I advised 150 minutes of exercise outside of work- he declines. Diet-eats poorly each AM- eating out, trying to change to oatmeal.  Wt Readings from Last 3 Encounters:  12/16/16 244 lb 3.2 oz (110.8 kg)  11/24/16 247 lb (112 kg)  11/28/15 247 lb (112 kg)  3. Immunizations/screenings/ancillary studies Immunization History  Administered Date(s) Administered  . Influenza Split 08/16/2011, 08/22/2012  . Influenza Whole 11/01/2001, 08/16/2007, 08/07/2008, 08/13/2009, 07/23/2010  . Influenza, High Dose Seasonal PF 07/28/2015  . Influenza,inj,Quad PF,36+ Mos 08/15/2013, 08/14/2014  . Influenza-Unspecified 08/02/2016  . Pneumococcal Conjugate-13 11/28/2015  . Pneumococcal Polysaccharide-23 06/03/2014  . Tdap 08/07/2008, 03/01/2011, 12/10/2012  . Zoster 03/01/2011   Health Maintenance Due  Topic Date Due  . Hepatitis C Screening - future labs Jan 05, 1951  . COLONOSCOPY - patient states he will call 04/01/2015   4. Prostate cancer screening-  low risk psa trend and rectal exam Lab Results  Component Value Date   PSA 0.55 12/09/2016   PSA 0.54 11/21/2015   PSA 0.68 08/22/2015   5. Colon cancer screening - patient to call GI as still has letter from 2016 when needed repeat 6. Skin cancer screening- usually once a year  Status of chronic or acute concerns  Hyperglycemia- needs weight loss. elevatd CBG noted. Lost 3 lbs. Very active with work- but has been slow last 2 months- plans to pick up. Discussed healthy eating. Did have cortisone injection 3 weeks ago.   History hematuria- plan on last check from 2/15 showed plan ct abdomen. If negative- was going to get cystocopy. States he had both- and negative. No blood on urine today  HLD- advised of need for statin potentially but weight loss is key. Did lose 3 lbs. Total and ldl down, but hdl down as  well.Needs more weight loss to avoid diabetes and statin.    Depression well controlled on paxil 40mg . phq2 of 0  6 month weight check or 1 year CPE  Return precautions advised.   Tana Conch, MD

## 2016-12-16 NOTE — Patient Instructions (Addendum)
Please call the stomach doctors as you need your updated colonoscopy  Great job losing 3 lbs! Lets focus on at least another 15 in next 6 months. Happy to see you in 6 months but 1 year for a physical would be reasonable as well as long as activity going up and weight going down  One way you could do the weight check in 6 months would be...  I would also like for you to sign up for an annual wellness visit with our nurse, Darl PikesSusan, who specializes in the annual wellness exam. This is a free benefit under medicare that may help us find additional ways to help you. Some highlights are reviewing medications, lifestyle, and doing a dementia screen.       ______________________________________________________________________  Starting October 1st 2018, I will be transferring to our new location: [facility] [place] [facility] [address]- [place] from [address]) [place] [id number] Phone: [phone]  I would love to have you remain my patient at this new location as long as it remains convenient for you. I am excited about the opportunity to have x-ray and sports medicine in the new building but will really miss the awesome staff and physicians at [facility]. Continue to schedule appointments at [facility] and we will automatically transfer them to the [facility] [place] location starting October 1st.

## 2016-12-16 NOTE — Progress Notes (Signed)
Pre visit review using our clinic review tool, if applicable. No additional management support is needed unless otherwise documented below in the visit note. 

## 2017-01-04 ENCOUNTER — Telehealth (INDEPENDENT_AMBULATORY_CARE_PROVIDER_SITE_OTHER): Payer: Self-pay | Admitting: Orthopaedic Surgery

## 2017-01-04 NOTE — Telephone Encounter (Signed)
Pt asking if he can get another round of the gel shots at his upcoming appt  [phone]

## 2017-01-05 ENCOUNTER — Telehealth (INDEPENDENT_AMBULATORY_CARE_PROVIDER_SITE_OTHER): Payer: Self-pay | Admitting: Orthopaedic Surgery

## 2017-01-05 NOTE — Telephone Encounter (Signed)
Patient had left earlier message wanting repeat Monovisc injections at next appt. I called and left voicemail letting him know that he can only have those every 6 months and not a day sooner. He called back and left a voicemail for me wanting to know if he can have cortisone injections at his next visit. He states the Monovisc did better than the cortisone, but after a week, the effects were gone. He does not see the need in coming back into the office in three weeks if he is not going to get an injection. He is still in a lot of pain. Please advise.   I did let him know that Dr. Ophelia CharterYates is out of the office until Monday and that I will call back after that with answer in regards to cortisone injections.

## 2017-01-05 NOTE — Telephone Encounter (Signed)
Patient's wife (brenda) called advised patient asked if he can get an injection in both knees again when he come to his appointment. The number to contact patient is [phone]

## 2017-01-05 NOTE — Telephone Encounter (Signed)
I left voicemail for patient advising that he can only get gel injections every 6 months and that it has to be at least 6 months, not a day sooner.

## 2017-01-09 NOTE — Telephone Encounter (Signed)
Ok to schedule to come back in and get cortisone injection or injections. thanks

## 2017-01-10 NOTE — Telephone Encounter (Signed)
I called patient and advised, ok for cortisone injections at return appt.

## 2017-02-01 ENCOUNTER — Encounter (INDEPENDENT_AMBULATORY_CARE_PROVIDER_SITE_OTHER): Payer: Self-pay | Admitting: Orthopaedic Surgery

## 2017-02-01 ENCOUNTER — Ambulatory Visit (INDEPENDENT_AMBULATORY_CARE_PROVIDER_SITE_OTHER): Payer: PPO | Admitting: Orthopaedic Surgery

## 2017-02-01 VITALS — BP 128/82 | HR 70 | Ht 68.0 in | Wt 247.0 lb

## 2017-02-01 DIAGNOSIS — M170 Bilateral primary osteoarthritis of knee: Secondary | ICD-10-CM | POA: Insufficient documentation

## 2017-02-01 MED ORDER — BUPIVACAINE HCL 0.25 % IJ SOLN
0.6600 mL | INTRAMUSCULAR | Status: AC | PRN
  Administered 2017-02-01: .66 mL via INTRA_ARTICULAR

## 2017-02-01 MED ORDER — LIDOCAINE HCL 1 % IJ SOLN
1.0000 mL | INTRAMUSCULAR | Status: AC | PRN
  Administered 2017-02-01: 1 mL

## 2017-02-01 MED ORDER — METHYLPREDNISOLONE ACETATE 40 MG/ML IJ SUSP
40.0000 mg | INTRAMUSCULAR | Status: AC | PRN
  Administered 2017-02-01: 40 mg via INTRA_ARTICULAR

## 2017-02-01 NOTE — Progress Notes (Signed)
Office Visit Note   Patient: Shawn Wood           Date of Birth: [birth year]           MRN: [id number] Visit Date: 02/01/2017              Requested by: Shelva Majestic, MD [address] [place], [place] [address] PCP: Tana Conch, MD   Assessment & Plan: Visit Diagnoses:  1. Bilateral primary osteoarthritis of knee     Plan: patient has bilateral primary knee  worse on the left than right. We discussed activities that he could do that would not aggravate his knee  Osteoarthritis symptoms. He'll work on dieting. Recheck 3 months. We discussed total knee arthroplasty, rehabilitation, home physical therapy, outpatient physical therapy after  The procedure. Questions were elicited and answ Recheck 3 months.  Follow-Up Instructions: Return in about 3 months (around 05/03/2017).   Orders:  Orders Placed This Encounter  Procedures  . Large Joint Injection/Arthrocentesis  . Large Joint Injection/Arthrocentesis   No orders of the defined types were placed in this encounter.     Procedures: Large Joint Inj Date/Time: 02/01/2017 10:10 AM Performed by: Eldred Manges Authorized by: Annell Greening C   Consent Given by:  Patient Indications:  Pain and joint swelling Location:  Knee Site:  R knee Prep: patient was prepped and draped in usual sterile fashion   Needle Size:  22 G Needle Length:  1.5 inches Approach:  Anterolateral Ultrasound Guidance: No   Fluoroscopic Guidance: No   Arthrogram: No   Medications:  1 mL lidocaine 1 %; 40 mg methylPREDNISolone acetate 40 MG/ML; 0.66 mL bupivacaine 0.25 % Patient tolerance:  Patient tolerated the procedure well with no immediate complications Large Joint Inj Date/Time: 02/01/2017 10:10 AM Performed by: Eldred Manges Authorized by: Annell Greening C   Consent Given by:  Patient Indications:  Pain and joint swelling Location:  Knee Site:  L knee Prep: patient was prepped and draped in usual sterile fashion   Needle Size:  22  G Needle Length:  1.5 inches Approach:  Anterolateral Ultrasound Guidance: No   Fluoroscopic Guidance: No   Arthrogram: No   Medications:  0.66 mL bupivacaine 0.25 %; 1 mL lidocaine 1 %; 40 mg methylPREDNISolone acetate 40 MG/ML Aspiration Attempted: No   Patient tolerance:  Patient tolerated the procedure well with no immediate complications     Clinical Data: No additional findings.   Subjective: Chief Complaint  Patient presents with  . Right Knee - Pain  . Left Knee - Pain    Patient returns with bilateral knee pain, left greater than right. He had Monovisc injections bilateral knees on 12/15/2016 which helped him x approximately 2 weeks. He is requesting bilateral cortisone injections today.     Review of Systems14 point reviewed. He has had history of hypoglycemi BMI is 37. 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 Problems with stairs.previous scapular fracture,horacic compression fracture,  Bilateral knee osteoarthritis.   Objective: Vital Signs: BP 128/82   Pulse 70   Ht  (1.727 m)   Wt 247 lb (112 kg)   BMI 37.56 kg/m   Physical Exam  Constitutional: He is oriented to person, place, and time. He appears well-developed and well-nourished.  HENT:  Head: Normocephalic and atraumatic.  Eyes: EOM are normal. Pupils are equal, round, and reactive to light.  Neck: No tracheal deviation present. No thyromegaly present.  Cardiovascular: Normal rate.   Pulmonary/Chest: Effort normal. He has no wheezes.  Abdominal: Soft. Bowel sounds are normal.  Musculoskeletal:  Bilateral knee crepitus with palpabl He has full extension. Collateral lie stable. Mild varus deformity. Distal pulses are intact negative Tib-fib joint and pes ursa is normal.No rash over exposed skin. Neck range of motion is normal. No significant quad atrophy.  Neurological: He is alert and oriented to person, place, and time.  Skin: Skin is warm and dry. Capillary  refill takes less than 2 seconds.  Psychiatric: He has a normal mood and affect. His behavior is normal. Judgment and thought content normal.    Ortho Exam  Specialty Comments:  No specialty comments available.  Imaging: No results found.   PMFS History: Patient Active Problem List   Diagnosis Date Noted  . Unilateral primary osteoarthritis, right knee 11/24/2016  . Atypical chest pain 11/07/2014  . Hyperlpidemia 06/04/2014  . Depression 06/03/2014  . Obesity, unspecified 06/03/2014  . Hematuria 06/02/2014  . Hyperglycemia 04/25/2013  . Traumatic compression fracture of thoracic vertebra (HCC) 03/04/2013    Class: Acute  . Fracture of multiple ribs 03/04/2013    Class: Acute  . Closed right scapular fracture 03/03/2013    Class: Acute  . CELLULITIS, LEG, LEFT 11/06/2009  . COLONIC POLYPS, HX OF 05/25/2007   Past Medical History:  Diagnosis Date  . COLONIC POLYPS, HX OF 05/25/2007  . HYPERGLYCEMIA 08/07/2008  . Leg pain right leg  . TINEA PEDIS 07/19/2007  . Varicose veins    history of cellulitis    Family History  Problem Relation Age of Onset  . Breast cancer Mother   . Prostate cancer Father     late 42s  . Brain cancer Sister   . Seizures Son   . Colon cancer Father     late 57s, did not have colonscopies    Past Surgical History:  Procedure Laterality Date  . KNEE SURGERY  1999   fracture  . VARICOSE VEIN SURGERY     Social History   Occupational History  . Not on file.   Social History Main Topics  . Smoking status: Never Smoker  . Smokeless tobacco: Never Used  . Alcohol use Yes     Comment: occcassinal beer  . Drug use: No  . Sexual activity: Yes

## 2017-04-20 ENCOUNTER — Telehealth: Payer: Self-pay | Admitting: Family Medicine

## 2017-04-20 MED ORDER — SCOPOLAMINE 1 MG/3DAYS TD PT72: 1.0000 | MEDICATED_PATCH | TRANSDERMAL | 0 refills | Status: DC

## 2017-04-20 NOTE — Telephone Encounter (Signed)
° ° °  Pt is going on a fishing trip and is asking for a motion patch   [facility]

## 2017-04-20 NOTE — Telephone Encounter (Signed)
Sent this in. Please inform patient

## 2017-04-21 NOTE — Telephone Encounter (Signed)
Spoke to patient and made aware.

## 2017-04-27 ENCOUNTER — Telehealth: Payer: Self-pay

## 2017-04-27 NOTE — Telephone Encounter (Signed)
Received PA for Transderm patches. PA submitted & pending. Key: [id number]

## 2017-04-28 ENCOUNTER — Telehealth (INDEPENDENT_AMBULATORY_CARE_PROVIDER_SITE_OTHER): Payer: Self-pay | Admitting: Orthopaedic Surgery

## 2017-04-28 NOTE — Telephone Encounter (Signed)
Patients wife called asking if he'll need prior authorization for his next monovisc shot? CB # [phone]

## 2017-04-28 NOTE — Telephone Encounter (Signed)
Can you check on this for me please and call patient's wife since I will not be in office? Patient had bilat monovisc in February.  Thank you.

## 2017-04-29 MED ORDER — SCOPOLAMINE 1 MG/3DAYS TD PT72: 1.0000 | MEDICATED_PATCH | TRANSDERMAL | 0 refills | Status: DC

## 2017-04-29 NOTE — Telephone Encounter (Signed)
I tried to send this in as the scopolamine 1mg  /3 day transdermal patch so not sure what the issue is here. Asher MuirJamie will call to clarify

## 2017-04-29 NOTE — Addendum Note (Signed)
Addended by: Shelva MajesticHUNTER, STEPHEN O on: 04/29/2017 09:29 AM   Modules accepted: Orders

## 2017-04-29 NOTE — Telephone Encounter (Signed)
Called patient and left a voicemail message for a return phone call. 

## 2017-04-29 NOTE — Telephone Encounter (Signed)
Tried to call [facility] twice. Their phone system is down.

## 2017-04-29 NOTE — Telephone Encounter (Signed)
PA denied, formulary alternatives are meclizine 12.5 and 25 mg tablets, scopolamine 1 mg/3 days transdermal patches.

## 2017-05-02 NOTE — Telephone Encounter (Signed)
The wife Steward DroneBrenda called and said she had received a phone call from the insurance company denying payment. I told her we had received the same notice and that we sent the prescription in again as the insurance company had its recommendations. I tried to call the pharmacy a couple of times on Friday and kept getting disconnected. She is going to call the pharmacy. I instructed her to let me know if we could be of further assistance. She verbalized understanding.

## 2017-05-03 ENCOUNTER — Telehealth (INDEPENDENT_AMBULATORY_CARE_PROVIDER_SITE_OTHER): Payer: Self-pay

## 2017-05-03 NOTE — Telephone Encounter (Signed)
Pt's wife called and states that the pt has an appt on 05/11/17 with Dr. Ophelia CharterYates and he is supposed to be getting monovisc injections and she wanted to make sure that this has been approved by the insurance company before the pt comes in.

## 2017-05-03 NOTE — Telephone Encounter (Signed)
Called and sw pt's wife to advise that the pt last had his injection in February and that there has to be a 6 month period in between injections before the pt can have repeat injections. The appt for next week was cancelled and resch for the end of August. Advised that I would sent this message to you to hold onto so that after 06/14/17 you could submit auth in portal again for bilat injections and that this would be enough time to have benefits consideration and hear back if approved prior to his appt. Advised that I would sent this message to you to hold onto until that time.

## 2017-05-11 ENCOUNTER — Encounter (INDEPENDENT_AMBULATORY_CARE_PROVIDER_SITE_OTHER): Payer: Self-pay | Admitting: Orthopaedic Surgery

## 2017-05-11 ENCOUNTER — Ambulatory Visit (INDEPENDENT_AMBULATORY_CARE_PROVIDER_SITE_OTHER): Payer: PPO

## 2017-05-11 ENCOUNTER — Ambulatory Visit (INDEPENDENT_AMBULATORY_CARE_PROVIDER_SITE_OTHER): Payer: PPO | Admitting: Orthopaedic Surgery

## 2017-05-11 ENCOUNTER — Encounter (INDEPENDENT_AMBULATORY_CARE_PROVIDER_SITE_OTHER): Payer: Self-pay

## 2017-05-11 VITALS — BP 150/81 | HR 67

## 2017-05-11 DIAGNOSIS — M25561 Pain in right knee: Secondary | ICD-10-CM

## 2017-05-11 DIAGNOSIS — G8929 Other chronic pain: Secondary | ICD-10-CM | POA: Diagnosis not present

## 2017-05-11 DIAGNOSIS — M25551 Pain in right hip: Secondary | ICD-10-CM

## 2017-05-11 DIAGNOSIS — M25562 Pain in left knee: Secondary | ICD-10-CM

## 2017-05-11 MED ORDER — METHYLPREDNISOLONE ACETATE 40 MG/ML IJ SUSP
40.0000 mg | INTRAMUSCULAR | Status: AC | PRN
  Administered 2017-05-11: 40 mg via INTRA_ARTICULAR

## 2017-05-11 MED ORDER — DICLOFENAC SODIUM 75 MG PO TBEC: 75.0000 mg | DELAYED_RELEASE_TABLET | Freq: Two times a day (BID) | ORAL | 0 refills | Status: DC

## 2017-05-11 MED ORDER — LIDOCAINE HCL 1 % IJ SOLN
1.0000 mL | INTRAMUSCULAR | Status: AC | PRN
  Administered 2017-05-11: 1 mL

## 2017-05-11 MED ORDER — BUPIVACAINE HCL 0.25 % IJ SOLN
0.6600 mL | INTRAMUSCULAR | Status: AC | PRN
  Administered 2017-05-11: .66 mL via INTRA_ARTICULAR

## 2017-05-11 NOTE — Progress Notes (Signed)
Office Visit Note   Patient: Shawn Wood           Date of Birth: 1951-08-20           MRN: [id number] Visit Date: 05/11/2017              Requested by: Shelva Majestic, MD [phone] [place], [place] [address] PCP: Shelva Majestic, MD   Assessment & Plan: Visit Diagnoses:  1. Pain in right hip   2. Chronic pain of both knees     Plan: Patient can return as needed. He needs continued work on weight loss to help with his hyperglycemia and prediabetes state. Full-term prescribed we discussed GI problems in patients over 55. He'll watch his blood pressure and I'll recheck him again on an as-needed basis.  Follow-Up Instructions: No Follow-up on file.   Orders:  Orders Placed This Encounter  Procedures  . XR HIP UNILAT W OR W/O PELVIS 2-3 VIEWS RIGHT  . XR Knee 1-2 Views Right  . XR Knee 1-2 Views Left   No orders of the defined types were placed in this encounter.     Procedures: Large Joint Inj Date/Time: 05/11/2017 11:20 AM Performed by: Eldred Manges Authorized by: Annell Greening C   Consent Given by:  Patient Indications:  Pain and joint swelling Location:  Knee Site:  R knee Needle Size:  22 G Needle Length:  1.5 inches Approach:  Anterolateral Ultrasound Guidance: No   Fluoroscopic Guidance: No   Arthrogram: No   Medications:  1 mL lidocaine 1 %; 40 mg methylPREDNISolone acetate 40 MG/ML; 0.66 mL bupivacaine 0.25 % Aspiration Attempted: No   Patient tolerance:  Patient tolerated the procedure well with no immediate complications     Clinical Data: No additional findings.   Subjective: Chief Complaint  Patient presents with  . 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 He requested the injection in his right knee which is becoming increasingly painful. Past history of cortisone injections and also mildest injections in the past. He's had difficult  sleeping some catching in his knee swelling in his right knee. Also right lateral hip pain at the greater trochanter and slightly posterior. He denies any numbness and tingling on his legassociated back pain no claudication symptoms no fever chills no bowel or bladder symptoms.  Review of Systems is systems updated positive for obesity with BMI 37. Previous thoracic compression fracture, bilateral knee osteoarthritis, previous scapular fracture. He was left lateral tibial plateau fracture treated with ORIF years ago. Negative for seizures MI or stroke. Positive for depression, hyperlipidemia, prediabetes otherwise negative as it pertains history of present illness.  Objective: Vital Signs: BP (!) 150/81   Pulse 67   Physical Exam  Constitutional: He is oriented to person, place, and time. He appears well-developed and well-nourished.  HENT:  Head: Normocephalic and atraumatic.  Eyes: EOM are normal. Pupils are equal, round, and reactive to light.  Neck: No tracheal deviation present. No thyromegaly present.  Cardiovascular: Normal rate.   Pulmonary/Chest: Effort normal. He has no wheezes.  Abdominal: Soft. Bowel sounds are normal.  Musculoskeletal:  Patient has negative straight leg raising pelvis was leveled no lumbar tenderness. Minimal sciatic notch tenderness. Tenderness over the right greater trochanter and just posterior to the trochanter over the short external rotators. Normal hip range of motion no pain with internal rotation. Right  knee shows 2+ synovitis. Collateral ligaments are stable there is crepitus with knee extension and pain with patellofemoral loading. Left knee shows well-healed lateral tibial plateau fracture distal pulses peroneal sensation and dorsiflexion strength is normal. Reflexes are 2+. Left knee shows no effusion minimal medial joint line tenderness.  Neurological: He is alert and oriented to person, place, and time.  Skin: Skin is warm and dry. Capillary refill  takes less than 2 seconds.  Psychiatric: He has a normal mood and affect. His behavior is normal. Judgment and thought content normal.    Ortho Exam  Specialty Comments:  No specialty comments available.  Imaging: No results found.   PMFS History: Patient Active Problem List   Diagnosis Date Noted  . Bilateral primary osteoarthritis of knee 02/01/2017  . Unilateral primary osteoarthritis, right knee 11/24/2016  . Atypical chest pain 11/07/2014  . Hyperlpidemia 06/04/2014  . Depression 06/03/2014  . Obesity, unspecified 06/03/2014  . Hematuria 06/02/2014  . Hyperglycemia 04/25/2013  . Traumatic compression fracture of thoracic vertebra (HCC) 03/04/2013    Class: Acute  . Fracture of multiple ribs 03/04/2013    Class: Acute  . Closed right scapular fracture 03/03/2013    Class: Acute  . CELLULITIS, LEG, LEFT 11/06/2009  . COLONIC POLYPS, HX OF 05/25/2007   Past Medical History:  Diagnosis Date  . COLONIC POLYPS, HX OF 05/25/2007  . HYPERGLYCEMIA 08/07/2008  . Leg pain right leg  . TINEA PEDIS 07/19/2007  . Varicose veins    history of cellulitis    Family History  Problem Relation Age of Onset  . Breast cancer Mother   . Prostate cancer Father        late 91s  . Brain cancer Sister   . Seizures Son   . Colon cancer Father        late 46s, did not have colonscopies    Past Surgical History:  Procedure Laterality Date  . KNEE SURGERY  1999   fracture  . VARICOSE VEIN SURGERY     Social History   Occupational History  . Not on file.   Social History Main Topics  . Smoking status: Never Smoker  . Smokeless tobacco: Never Used  . Alcohol use Yes     Comment: occcassinal beer  . Drug use: No  . Sexual activity: Yes

## 2017-05-26 ENCOUNTER — Other Ambulatory Visit: Payer: Self-pay | Admitting: Family Medicine

## 2017-06-03 ENCOUNTER — Telehealth: Payer: Self-pay

## 2017-06-03 NOTE — Telephone Encounter (Signed)
Received PA request for scopolamine 1mg /3 day patches. Pa submitted & is pending. Key: [id number]

## 2017-06-09 NOTE — Telephone Encounter (Signed)
Pharmacy calling to check the status of the PA

## 2017-06-10 NOTE — Telephone Encounter (Signed)
Shawn SagoSarah- the message says that scopolamine 1mg /3 days transdermal patches are denied yet that is what it appears we originally prescribed. Can you clarify??

## 2017-06-10 NOTE — Telephone Encounter (Signed)
PA denied, formulary alternatives are meclizine 12.5 and 25 mg tablets, scopolamine 1 mg/3 days transdermal patches.  I did the PA for the generic patches as well and it is still being denied.

## 2017-06-11 ENCOUNTER — Ambulatory Visit (INDEPENDENT_AMBULATORY_CARE_PROVIDER_SITE_OTHER): Payer: PPO | Admitting: Urgent Care

## 2017-06-11 ENCOUNTER — Encounter: Payer: Self-pay | Admitting: Urgent Care

## 2017-06-11 VITALS — BP 135/73 | HR 69 | Temp 98.7°F | Resp 16 | Ht 68.0 in | Wt 246.0 lb

## 2017-06-11 DIAGNOSIS — Z872 Personal history of diseases of the skin and subcutaneous tissue: Secondary | ICD-10-CM

## 2017-06-11 DIAGNOSIS — R739 Hyperglycemia, unspecified: Secondary | ICD-10-CM

## 2017-06-11 DIAGNOSIS — L299 Pruritus, unspecified: Secondary | ICD-10-CM | POA: Diagnosis not present

## 2017-06-11 DIAGNOSIS — I8390 Asymptomatic varicose veins of unspecified lower extremity: Secondary | ICD-10-CM | POA: Diagnosis not present

## 2017-06-11 DIAGNOSIS — R21 Rash and other nonspecific skin eruption: Secondary | ICD-10-CM

## 2017-06-11 LAB — POCT GLYCOSYLATED HEMOGLOBIN (HGB A1C): Hemoglobin A1C: 5.7

## 2017-06-11 LAB — POCT CBC
Granulocyte percent: 69.8 %G (ref 37–80)
HCT, POC: 43.9 % (ref 43.5–53.7)
HEMOGLOBIN: 14.9 g/dL (ref 14.1–18.1)
LYMPH, POC: 1.3 (ref 0.6–3.4)
MCH: 30.1 pg (ref 27–31.2)
MCHC: 33.9 g/dL (ref 31.8–35.4)
MCV: 88.8 fL (ref 80–97)
MID (cbc): 0.6 (ref 0–0.9)
MPV: 7.8 fL (ref 0–99.8)
PLATELET COUNT, POC: 266 10*3/uL (ref 142–424)
POC Granulocyte: 4.4 (ref 2–6.9)
POC LYMPH PERCENT: 21.3 %L (ref 10–50)
POC MID %: 8.9 %M (ref 0–12)
RBC: 4.94 M/uL (ref 4.69–6.13)
RDW, POC: 13.5 %
WBC: 6.3 10*3/uL (ref 4.6–10.2)

## 2017-06-11 LAB — POCT SKIN KOH: Skin KOH, POC: NEGATIVE

## 2017-06-11 MED ORDER — CETIRIZINE HCL 10 MG PO TABS: 10.0000 mg | ORAL_TABLET | Freq: Every day | ORAL | 11 refills | Status: DC

## 2017-06-11 MED ORDER — TRIAMCINOLONE ACETONIDE 0.1 % EX CREA: 1.0000 "application " | TOPICAL_CREAM | Freq: Two times a day (BID) | CUTANEOUS | 0 refills | Status: DC

## 2017-06-11 MED ORDER — CEPHALEXIN 500 MG PO CAPS: 500.0000 mg | ORAL_CAPSULE | Freq: Three times a day (TID) | ORAL | 0 refills | Status: DC

## 2017-06-11 NOTE — Patient Instructions (Addendum)
Cellulitis, Adult Cellulitis is a skin infection. The infected area is usually red and tender. This condition occurs most often in the arms and lower legs. The infection can travel to the muscles, blood, and underlying tissue and become serious. It is very important to get treated for this condition. What are the causes? Cellulitis is caused by bacteria. The bacteria enter through a break in the skin, such as a cut, burn, insect bite, open sore, or crack. What increases the risk? This condition is more likely to occur in people who:  Have a weak defense system (immune system).  Have open wounds on the skin such as cuts, burns, bites, and scrapes. Bacteria can enter the body through these open wounds.  Are older.  Have diabetes.  Have a type of long-lasting (chronic) liver disease (cirrhosis) or kidney disease.  Use IV drugs.  What are the signs or symptoms? Symptoms of this condition include:  Redness, streaking, or spotting on the skin.  Swollen area of the skin.  Tenderness or pain when an area of the skin is touched.  Warm skin.  Fever.  Chills.  Blisters.  How is this diagnosed? This condition is diagnosed based on a medical history and physical exam. You may also have tests, including:  Blood tests.  Lab tests.  Imaging tests.  How is this treated? Treatment for this condition may include:  Medicines, such as antibiotic medicines or antihistamines.  Supportive care, such as rest and application of cold or warm cloths (cold or warm compresses) to the skin.  Hospital care, if the condition is severe.  The infection usually gets better within 1-2 days of treatment. Follow these instructions at home:  Take over-the-counter and prescription medicines only as told by your health care provider.  If you were prescribed an antibiotic medicine, take it as told by your health care provider. Do not stop taking the antibiotic even if you start to feel  better.  Drink enough fluid to keep your urine clear or pale yellow.  Do not touch or rub the infected area.  Raise (elevate) the infected area above the level of your heart while you are sitting or lying down.  Apply warm or cold compresses to the affected area as told by your health care provider.  Keep all follow-up visits as told by your health care provider. This is important. These visits let your health care provider make sure a more serious infection is not developing. Contact a health care provider if:  You have a fever.  Your symptoms do not improve within 1-2 days of starting treatment.  Your bone or joint underneath the infected area becomes painful after the skin has healed.  Your infection returns in the same area or another area.  You notice a swollen bump in the infected area.  You develop new symptoms.  You have a general ill feeling (malaise) with muscle aches and pains. Get help right away if:  Your symptoms get worse.  You feel very sleepy.  You develop vomiting or diarrhea that persists.  You notice red streaks coming from the infected area.  Your red area gets larger or turns dark in color. This information is not intended to replace advice given to you by your health care provider. Make sure you discuss any questions you have with your health care provider. Document Released: 07/28/2005 Document Revised: 02/26/2016 Document Reviewed: 08/27/2015 Elsevier Interactive Patient Education  2017 Elsevier Inc.      IF you received an x-ray   today, you will receive an invoice from [facility]. Please contact [facility] at [phone] with questions or concerns regarding your invoice.   IF you received labwork today, you will receive an invoice from [facility]. Please contact [facility] at [phone] with questions or concerns regarding your invoice.   Our billing staff will not be able to assist you with questions regarding bills from  these companies.  You will be contacted with the lab results as soon as they are available. The fastest way to get your results is to activate your My Chart account. Instructions are located on the last page of this paperwork. If you have not heard from us regarding the results in 2 weeks, please contact this office.      

## 2017-06-11 NOTE — Progress Notes (Signed)
MRN: [id number] DOB: July 15, 1951  Subjective:   Shawn Wood is a 66 y.o. male presenting for chief complaint of Establish Care (Rash bilateral notice 1 week ago )  Reports 1 week history of worsening rash. Rash is primarily itchy, weeps when he scratches the rash.  Patient was hospitalized ~6 years ago for cellulitis. Received a steroid course, antibiotics with resolution of symptoms. Denies smoking cigarettes. Denies history of dvt, PAD. Denies fever, n/v, abdominal pain, painful rash, chills, calf pain.Patient works with strong chemicals, seal coating in parking lots. Has not started any new medications recently.  Fidel has a current medication list which includes the following prescription(s): aspirin ec, diclofenac, and paroxetine. Also has No Known Allergies.  Nikolos  has a past medical history of COLONIC POLYPS, HX OF (05/25/2007); HYPERGLYCEMIA (08/07/2008); Leg pain (right leg); TINEA PEDIS (07/19/2007); and Varicose veins. Also  has a past surgical history that includes Varicose vein surgery and Knee surgery (1999).  Objective:   Vitals: BP 135/73   Pulse 69   Temp 98.7 F (37.1 C) (Oral)   Resp 16   Ht 5\' 8"  (1.727 m)   Wt 246 lb (111.6 kg)   SpO2 95%   BMI 37.40 kg/m   Physical Exam  Constitutional: He is oriented to person, place, and time. He appears well-developed and well-nourished.  Cardiovascular: Normal rate, regular rhythm and intact distal pulses.  Exam reveals no gallop and no friction rub.   No murmur heard. Pulmonary/Chest: No respiratory distress. He has no wheezes. He has no rales.  Musculoskeletal:       Left lower leg: He exhibits no tenderness (negative Homann sign).  Neurological: He is alert and oriented to person, place, and time.  Skin: Rash noted.      Results for orders placed or performed in visit on 06/11/17 (from the past 24 hour(s))  POCT CBC     Status: None   Collection Time: 06/11/17 12:42 PM  Result Value Ref Range   WBC 6.3 4.6 - 10.2 K/uL    Lymph, poc 1.3 0.6 - 3.4   POC LYMPH PERCENT 21.3 10 - 50 %L   MID (cbc) 0.6 0 - 0.9   POC MID % 8.9 0 - 12 %M   POC Granulocyte 4.4 2 - 6.9   Granulocyte percent 69.8 37 - 80 %G   RBC 4.94 4.69 - 6.13 M/uL   Hemoglobin 14.9 14.1 - 18.1 g/dL   HCT, POC 42.5 95.6 - 53.7 %   MCV 88.8 80 - 97 fL   MCH, POC 30.1 27 - 31.2 pg   MCHC 33.9 31.8 - 35.4 g/dL   RDW, POC 38.7 %   Platelet Count, POC 266 142 - 424 K/uL   MPV 7.8 0 - 99.8 fL  POCT glycosylated hemoglobin (Hb A1C)     Status: None   Collection Time: 06/11/17 12:43 PM  Result Value Ref Range   Hemoglobin A1C 5.7   POCT Skin KOH     Status: None   Collection Time: 06/11/17 12:43 PM  Result Value Ref Range   Skin KOH, POC Negative Negative    Assessment and Plan :   1. Rash and nonspecific skin eruption 2. Itching 3. Varicose veins of lower extremity 4. History of cellulitis - Will cover for cellulitis with Keflex TID, use Kenalog steroid cream over left lower leg to cover for a contact dermatitis likely from the chemicals he uses at work. Follow up in 2 days.  5. Elevated  blood sugar - Borderline pre-diabetes.  Wallis Bamberg, PA-C [facility] [phone] 06/11/2017  12:16 PM

## 2017-06-13 ENCOUNTER — Ambulatory Visit (INDEPENDENT_AMBULATORY_CARE_PROVIDER_SITE_OTHER): Payer: PPO | Admitting: Urgent Care

## 2017-06-13 ENCOUNTER — Encounter: Payer: Self-pay | Admitting: Urgent Care

## 2017-06-13 VITALS — BP 142/84 | HR 74 | Temp 98.7°F | Resp 18 | Ht 68.0 in | Wt 244.8 lb

## 2017-06-13 DIAGNOSIS — L299 Pruritus, unspecified: Secondary | ICD-10-CM | POA: Diagnosis not present

## 2017-06-13 DIAGNOSIS — Z1211 Encounter for screening for malignant neoplasm of colon: Secondary | ICD-10-CM

## 2017-06-13 DIAGNOSIS — R030 Elevated blood-pressure reading, without diagnosis of hypertension: Secondary | ICD-10-CM | POA: Diagnosis not present

## 2017-06-13 DIAGNOSIS — Z872 Personal history of diseases of the skin and subcutaneous tissue: Secondary | ICD-10-CM | POA: Diagnosis not present

## 2017-06-13 DIAGNOSIS — I8390 Asymptomatic varicose veins of unspecified lower extremity: Secondary | ICD-10-CM | POA: Diagnosis not present

## 2017-06-13 DIAGNOSIS — R21 Rash and other nonspecific skin eruption: Secondary | ICD-10-CM | POA: Diagnosis not present

## 2017-06-13 NOTE — Telephone Encounter (Signed)
Called and spoke with pharmacy. They are unable to order the generic which is the scopolamine. Insurance will not cover the brand name if patient has not tried the alternatives. The alternatives are the generic patch (which the pharmacy can't order) and the meclizine.   Sorry for the confusion!

## 2017-06-13 NOTE — Patient Instructions (Addendum)
Rash A rash is a change in the color of the skin. A rash can also change the way your skin feels. There are many different conditions and factors that can cause a rash. Follow these instructions at home: Pay attention to any changes in your symptoms. Follow these instructions to help with your condition: Medicine Take or apply over-the-counter and prescription medicines only as told by your health care provider. These may include:  Corticosteroid cream.  Anti-itch lotions.  Oral antihistamines.  Skin Care  Apply cool compresses to the affected areas.  Try taking a bath with: ? Epsom salts. Follow the instructions on the packaging. You can get these at your local pharmacy or grocery store. ? Baking soda. Pour a small amount into the bath as told by your health care provider. ? Colloidal oatmeal. Follow the instructions on the packaging. You can get this at your local pharmacy or grocery store.  Try applying baking soda paste to your skin. Stir water into baking soda until it reaches a paste-like consistency.  Do not scratch or rub your skin.  Avoid covering the rash. Make sure the rash is exposed to air as much as possible. General instructions  Avoid hot showers or baths, which can make itching worse. A cold shower may help.  Avoid scented soaps, detergents, and perfumes. Use gentle soaps, detergents, perfumes, and other cosmetic products.  Avoid any substance that causes your rash. Keep a journal to help track what causes your rash. Write down: ? What you eat. ? What cosmetic products you use. ? What you drink. ? What you wear. This includes jewelry.  Keep all follow-up visits as told by your health care provider. This is important. Contact a health care provider if:  You sweat at night.  You lose weight.  You urinate more than normal.  You feel weak.  You vomit.  Your skin or the whites of your eyes look yellow (jaundice).  Your skin: ? Tingles. ? Is  numb.  Your rash: ? Does not go away after several days. ? Gets worse.  You are: ? Unusually thirsty. ? More tired than normal.  You have: ? New symptoms. ? Pain in your abdomen. ? A fever. ? Diarrhea. Get help right away if:  You develop a rash that covers all or most of your body. The rash may or may not be painful.  You develop blisters that: ? Are on top of the rash. ? Grow larger or grow together. ? Are painful. ? Are inside your nose or mouth.  You develop a rash that: ? Looks like purple pinprick-sized spots all over your body. ? Has a "bull's eye" or looks like a target. ? Is not related to sun exposure, is red and painful, and causes your skin to peel. This information is not intended to replace advice given to you by your health care provider. Make sure you discuss any questions you have with your health care provider. Document Released: 10/08/2002 Document Revised: 03/23/2016 Document Reviewed: 03/05/2015 Elsevier Interactive Patient Education  2017 Elsevier Inc.     IF you received an x-ray today, you will receive an invoice from [facility]. Please contact [facility] at [phone] with questions or concerns regarding your invoice.   IF you received labwork today, you will receive an invoice from [facility]. Please contact [facility] at [phone] with questions or concerns regarding your invoice.   Our billing staff will not be able to assist you with questions regarding bills from these   companies.  You will be contacted with the lab results as soon as they are available. The fastest way to get your results is to activate your My Chart account. Instructions are located on the last page of this paperwork. If you have not heard from us regarding the results in 2 weeks, please contact this office.      

## 2017-06-13 NOTE — Progress Notes (Signed)
MRN: [id number] DOB: 05-02-1951  Subjective:   Shawn Wood is a 66 y.o. male presenting for follow up on rash of his legs. Last OV was 06/11/2017, was started on Keflex and topical Kenalog for his leg rash. He notes significant improvement. Denies fever, pain, drainage or pus or bleeding.  Health maintenance: Patient is due for a colonoscopy but refuses to schedule this now. States that he has significant restraints in his schedule, will plan to schedule this in the future.  Shawn Wood has a current medication list which includes the following prescription(s): aspirin ec, cephalexin, cetirizine, diclofenac, paroxetine, and triamcinolone cream. Also has No Known Allergies.  Shawn Wood  has a past medical history of COLONIC POLYPS, HX OF (05/25/2007); HYPERGLYCEMIA (08/07/2008); Leg pain (right leg); TINEA PEDIS (07/19/2007); and Varicose veins. Also  has a past surgical history that includes Varicose vein surgery and Knee surgery (1999).  Objective:   Vitals: BP (!) 142/84   Pulse 74   Temp 98.7 F (37.1 C) (Oral)   Resp 18   Ht 5\' 8"  (1.727 m)   Wt 244 lb 12.8 oz (111 kg)   SpO2 97%   BMI 37.22 kg/m   BP Readings from Last 3 Encounters:  06/13/17 (!) 142/84  06/11/17 135/73  05/11/17 (!) 150/81    Physical Exam  Constitutional: He is oriented to person, place, and time. He appears well-developed and well-nourished.  Cardiovascular: Normal rate.   Pulmonary/Chest: Effort normal.  Neurological: He is alert and oriented to person, place, and time.  Skin:       Assessment and Plan :   1. Rash and nonspecific skin eruption 2. Itching 3. Varicose veins of lower extremity 4. History of cellulitis - Improved, continue Keflex and Kenalog cream. Return-to-clinic precautions discussed, patient verbalized understanding.   5. Elevated blood pressure reading - Monitor his BP at home, rtc if he has persistently elevated BP   6. Colon cancer screening - Patient will schedule this on his own  in the future. Refuses a referral.  Wallis Bamberg, PA-C [facility] [phone] 06/13/2017 8:20 AM

## 2017-06-21 NOTE — Telephone Encounter (Signed)
I called patient and advised Monovisc has not been authorized yet due to having to wait for specialty pharmacy review and approval. I offered to call him and work him in to schedule as soon as the approval comes through. He asked me to hold on, but we were disconnected. I have not been able to reach him back. Will continue to try.

## 2017-06-22 ENCOUNTER — Ambulatory Visit (INDEPENDENT_AMBULATORY_CARE_PROVIDER_SITE_OTHER): Payer: PPO | Admitting: Orthopaedic Surgery

## 2017-07-05 ENCOUNTER — Other Ambulatory Visit (INDEPENDENT_AMBULATORY_CARE_PROVIDER_SITE_OTHER): Payer: Self-pay | Admitting: Orthopaedic Surgery

## 2017-07-05 NOTE — Telephone Encounter (Signed)
Ok for refill? 

## 2017-07-08 NOTE — Telephone Encounter (Signed)
Received benefit information and went over with Toniann FailWendy. OK for [employer] and Bill.  All information sent in an email to Milana NaAnnette Ingram to advise what was done per benefit info.

## 2017-07-12 ENCOUNTER — Ambulatory Visit (INDEPENDENT_AMBULATORY_CARE_PROVIDER_SITE_OTHER): Payer: PPO | Admitting: Orthopaedic Surgery

## 2017-07-12 ENCOUNTER — Encounter (INDEPENDENT_AMBULATORY_CARE_PROVIDER_SITE_OTHER): Payer: Self-pay | Admitting: Orthopaedic Surgery

## 2017-07-12 VITALS — BP 138/82 | HR 76 | Ht 68.0 in | Wt 244.0 lb

## 2017-07-12 DIAGNOSIS — M170 Bilateral primary osteoarthritis of knee: Secondary | ICD-10-CM

## 2017-07-12 MED ORDER — LIDOCAINE HCL 1 % IJ SOLN
1.0000 mL | INTRAMUSCULAR | Status: AC | PRN
  Administered 2017-07-12: 1 mL

## 2017-07-12 MED ORDER — HYALURONAN 88 MG/4ML IX SOSY
88.0000 mg | PREFILLED_SYRINGE | INTRA_ARTICULAR | Status: AC | PRN
  Administered 2017-07-12: 88 mg via INTRA_ARTICULAR

## 2017-07-12 NOTE — Progress Notes (Signed)
Office Visit Note   Patient: Shawn Wood           Date of Birth: 03/01/1951           MRN: [id number] Visit Date: 07/12/2017              Requested by: Shelva Majestic, MD [address] [place], [place] [address] PCP: Shelva Majestic, MD   Assessment & Plan: Visit Diagnoses:  1. Bilateral primary osteoarthritis of knee     Plan: Right and left knee mild disc injection performed which he tolerated well. He will return if he has persistent problems.  Follow-Up Instructions: Return if symptoms worsen or fail to improve.   Orders:  Orders Placed This Encounter  Procedures  . Large Joint Injection/Arthrocentesis  . Large Joint Injection/Arthrocentesis   No orders of the defined types were placed in this encounter.     Procedures: Large Joint Inj Date/Time: 07/12/2017 3:30 PM Performed by: Eldred Manges Authorized by: Annell Greening C   Consent Given by:  Patient Indications:  Pain and joint swelling Location:  Knee Site:  R knee Needle Size:  22 G Needle Length:  1.5 inches Approach:  Anterolateral Ultrasound Guidance: No   Fluoroscopic Guidance: No   Arthrogram: No   Medications:  1 mL lidocaine 1 %; 88 mg Hyaluronan 88 MG/4ML Aspiration Attempted: No   Patient tolerance:  Patient tolerated the procedure well with no immediate complications Large Joint Inj Date/Time: 07/12/2017 3:30 PM Performed by: Eldred Manges Authorized by: Annell Greening C   Consent Given by:  Patient Indications:  Pain and joint swelling Location:  Knee Site:  L knee Needle Size:  22 G Needle Length:  1.5 inches Approach:  Anterolateral Ultrasound Guidance: No   Fluoroscopic Guidance: No   Arthrogram: No   Medications:  1 mL lidocaine 1 %; 88 mg Hyaluronan 88 MG/4ML Aspiration Attempted: No   Patient tolerance:  Patient tolerated the procedure well with no immediate complications     Clinical Data: No additional findings.   Subjective: Chief Complaint  Patient  presents with  . Knee Pain    Bilateral monovisc injections-buy and bill    HPI cc 66-year-old male returns for bilateral knee injections with mono this. He had previous injection of care where he got some good relief and called for repeat injection. Patient has any still knee arthroplasty but  Review of Systems 14 point review of systems is updated and is unchanged.   Objective: Vital Signs: BP 138/82   Pulse 76   Ht  (1.727 m)   Wt 244 lb (110.7 kg)   BMI 37.10 kg/m   Physical Exam  Constitutional: He is oriented to person, place, and time. He appears well-developed and well-nourished.  HENT:  Head: Normocephalic and atraumatic.  Eyes: Pupils are equal, round, and reactive to light. EOM are normal.  Neck: No tracheal deviation present. No thyromegaly present.  Cardiovascular: Normal rate.   Pulmonary/Chest: Effort normal. He has no wheezes.  Abdominal: Soft. Bowel sounds are normal.  Musculoskeletal:  Bilateral knee crepitus with knee range of motion. Distal pulses are intact no pain with hip range of motion.  Neurological: He is alert and oriented to person, place, and time.  Skin: Skin is warm and dry. Capillary refill takes less than 2 seconds.  Psychiatric: He has a normal mood and affect. His behavior is normal. Judgment and thought content normal.    Ortho Exam  Specialty Comments:  No specialty comments available.  Imaging: No results found.   PMFS History: Patient Active Problem List   Diagnosis Date Noted  . Bilateral primary osteoarthritis of knee 02/01/2017  . Unilateral primary osteoarthritis, right knee 11/24/2016  . Atypical chest pain 11/07/2014  . Hyperlpidemia 06/04/2014  . Depression 06/03/2014  . Obesity, unspecified 06/03/2014  . Hematuria 06/02/2014  . Hyperglycemia 04/25/2013  . Traumatic compression fracture of thoracic vertebra (HCC) 03/04/2013    Class: Acute  . Fracture of multiple ribs 03/04/2013    Class: Acute  . Closed  right scapular fracture 03/03/2013    Class: Acute  . CELLULITIS, LEG, LEFT 11/06/2009  . COLONIC POLYPS, HX OF 05/25/2007   Past Medical History:  Diagnosis Date  . COLONIC POLYPS, HX OF 05/25/2007  . HYPERGLYCEMIA 08/07/2008  . Leg pain right leg  . TINEA PEDIS 07/19/2007  . Varicose veins    history of cellulitis    Family History  Problem Relation Age of Onset  . Breast cancer Mother   . Prostate cancer Father        late 6860s  . Colon cancer Father        late 680s, did not have colonscopies  . Brain cancer Sister   . Seizures Son     Past Surgical History:  Procedure Laterality Date  . KNEE SURGERY  1999   fracture  . VARICOSE VEIN SURGERY     Social History   Occupational History  . Not on file.   Social History Main Topics  . Smoking status: Never Smoker  . Smokeless tobacco: Never Used  . Alcohol use Yes     Comment: occcassinal beer  . Drug use: No  . Sexual activity: Yes

## 2017-07-12 NOTE — Telephone Encounter (Signed)
I left voicemail for patient's wife advising Monovisc has been approved through medical and that I needed to schedule patient appt. Asked for return call to see if we can work him into the schedule for Bilat Monovisc injections.

## 2017-07-19 ENCOUNTER — Ambulatory Visit: Payer: PPO

## 2017-08-16 ENCOUNTER — Ambulatory Visit: Payer: PPO

## 2017-08-18 ENCOUNTER — Ambulatory Visit: Payer: PPO

## 2017-09-14 ENCOUNTER — Ambulatory Visit (INDEPENDENT_AMBULATORY_CARE_PROVIDER_SITE_OTHER): Payer: PPO

## 2017-09-14 DIAGNOSIS — Z23 Encounter for immunization: Secondary | ICD-10-CM | POA: Diagnosis not present

## 2017-11-15 ENCOUNTER — Ambulatory Visit (INDEPENDENT_AMBULATORY_CARE_PROVIDER_SITE_OTHER): Payer: PPO | Admitting: Orthopaedic Surgery

## 2017-11-15 ENCOUNTER — Encounter (INDEPENDENT_AMBULATORY_CARE_PROVIDER_SITE_OTHER): Payer: Self-pay | Admitting: Orthopaedic Surgery

## 2017-11-15 VITALS — BP 139/84 | HR 79 | Ht 68.0 in | Wt 245.0 lb

## 2017-11-15 DIAGNOSIS — M1711 Unilateral primary osteoarthritis, right knee: Secondary | ICD-10-CM

## 2017-11-15 DIAGNOSIS — M170 Bilateral primary osteoarthritis of knee: Secondary | ICD-10-CM

## 2017-11-15 DIAGNOSIS — M1712 Unilateral primary osteoarthritis, left knee: Secondary | ICD-10-CM

## 2017-11-15 MED ORDER — METHYLPREDNISOLONE ACETATE 40 MG/ML IJ SUSP
40.0000 mg | INTRAMUSCULAR | Status: AC | PRN
  Administered 2017-11-15: 40 mg via INTRA_ARTICULAR

## 2017-11-15 MED ORDER — BUPIVACAINE HCL 0.5 % IJ SOLN
3.0000 mL | INTRAMUSCULAR | Status: AC | PRN
  Administered 2017-11-15: 3 mL via INTRA_ARTICULAR

## 2017-11-15 MED ORDER — LIDOCAINE HCL 1 % IJ SOLN
0.5000 mL | INTRAMUSCULAR | Status: AC | PRN
  Administered 2017-11-15: .5 mL

## 2017-11-15 MED ORDER — BUPIVACAINE HCL 0.25 % IJ SOLN
4.0000 mL | INTRAMUSCULAR | Status: AC | PRN
  Administered 2017-11-15: 4 mL via INTRA_ARTICULAR

## 2017-11-15 NOTE — Progress Notes (Signed)
Office Visit Note   Patient: Shawn Wood           Date of Birth: [birth year]           MRN: [id number] Visit Date: 11/15/2017              Requested by: Shelva Majestic, MD [address] [place], [place] [address] PCP: Shelva Majestic, MD   Assessment & Plan: Visit Diagnoses:  1. Bilateral primary osteoarthritis of knee     Plan: Patient states the Monovisc injections did not work that was done in September.  He states he got much better relief with the cortisone requested bilateral cortisone injections which were done today.  He will continue to try to work a little bit of weight loss and dieting and return when he has increased problems.  Follow-Up Instructions: Return if symptoms worsen or fail to improve.   Orders:  Orders Placed This Encounter  Procedures  . Large Joint Inj  . Large Joint Inj   No orders of the defined types were placed in this encounter.     Procedures: Large Joint Inj: L knee on 11/15/2017 3:00 PM Indications: joint swelling and pain Details: 22 G 1.5 in needle, anterolateral approach  Arthrogram: No  Medications: 0.5 mL lidocaine 1 %; 3 mL bupivacaine 0.5 %; 40 mg methylPREDNISolone acetate 40 MG/ML Outcome: tolerated well, no immediate complications Procedure, treatment alternatives, risks and benefits explained, specific risks discussed. Consent was given by the patient. Immediately prior to procedure a time out was called to verify the correct patient, procedure, equipment, support staff and site/side marked as required. Patient was prepped and draped in the usual sterile fashion.   Large Joint Inj: R knee on 11/15/2017 3:00 PM Indications: pain and joint swelling Details: 22 G 1.5 in needle, anterolateral approach  Arthrogram: No  Medications: 40 mg methylPREDNISolone acetate 40 MG/ML; 0.5 mL lidocaine 1 %; 4 mL bupivacaine 0.25 % Outcome: tolerated well, no immediate complications Procedure, treatment alternatives, risks and  benefits explained, specific risks discussed. Consent was given by the patient. Immediately prior to procedure a time out was called to verify the correct patient, procedure, equipment, support staff and site/side marked as required. Patient was prepped and draped in the usual sterile fashion.       Clinical Data: No additional findings.   Subjective: Chief Complaint  Patient presents with  . Right Knee - Pain  . 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  He has had multiple injections he takes Voltaren.  He applies ice intermittently.  Knees are worse when he is on the more sometimes he has some swelling.  He states he is try to put off total knee arthroplasty as long as he can.  He requested cortisone injections today.  Review of Systems 14 point review of systems updated unchanged from his office visit September 2018 other than as mentioned in HPI.   Objective: Vital Signs: BP 139/84   Pulse 79   Ht 5\' 8"  (1.727 m)   Wt 245 lb (111.1 kg)   BMI 37.25 kg/m   Physical Exam  Constitutional: He is oriented to person, place, and time. He appears well-developed and well-nourished.  HENT:  Head: Normocephalic and atraumatic.  Eyes: EOM are normal. Pupils are equal, round, and reactive to light.  Neck: No tracheal deviation present. No thyromegaly present.  Cardiovascular: Normal rate.  Pulmonary/Chest: Effort normal. He has no wheezes.  Abdominal: Soft. Bowel sounds are normal.  Neurological: He is alert and oriented to person, place, and time.  Skin: Skin is warm and dry. Capillary refill takes less than 2 seconds.  Psychiatric: He has a normal mood and affect. His behavior is normal. Judgment and thought content normal.    Ortho Exam bilateral knee crepitus well-healed left knee lateral incision for ORIF tibial plateau.  Crepitus with knee range of motion  collateral ligaments are stable.  No pitting edema negative logroll the hips.  Bilateral 2+ synovitis.  Specialty Comments:  No specialty comments available.  Imaging: No results found.   PMFS History: Patient Active Problem List   Diagnosis Date Noted  . Bilateral primary osteoarthritis of knee 02/01/2017  . Unilateral primary osteoarthritis, right knee 11/24/2016  . Atypical chest pain 11/07/2014  . Hyperlpidemia 06/04/2014  . Depression 06/03/2014  . Obesity, unspecified 06/03/2014  . Hematuria 06/02/2014  . Hyperglycemia 04/25/2013  . Traumatic compression fracture of thoracic vertebra (HCC) 03/04/2013    Class: Acute  . Fracture of multiple ribs 03/04/2013    Class: Acute  . Closed right scapular fracture 03/03/2013    Class: Acute  . CELLULITIS, LEG, LEFT 11/06/2009  . COLONIC POLYPS, HX OF 05/25/2007   Past Medical History:  Diagnosis Date  . COLONIC POLYPS, HX OF 05/25/2007  . HYPERGLYCEMIA 08/07/2008  . Leg pain right leg  . TINEA PEDIS 07/19/2007  . Varicose veins    history of cellulitis    Family History  Problem Relation Age of Onset  . Breast cancer Mother   . Prostate cancer Father        late 1370s  . Colon cancer Father        late 1570s, did not have colonscopies  . Brain cancer Sister   . Seizures Son     Past Surgical History:  Procedure Laterality Date  . KNEE SURGERY  1999   fracture  . VARICOSE VEIN SURGERY     Social History   Occupational History  . Not on file  Tobacco Use  . Smoking status: Never Smoker  . Smokeless tobacco: Never Used  Substance and Sexual Activity  . Alcohol use: Yes    Comment: occcassinal beer  . Drug use: No  . Sexual activity: Yes

## 2017-11-29 ENCOUNTER — Ambulatory Visit: Payer: PPO | Admitting: Family Medicine

## 2017-11-29 DIAGNOSIS — R21 Rash and other nonspecific skin eruption: Secondary | ICD-10-CM | POA: Diagnosis not present

## 2017-11-30 ENCOUNTER — Encounter: Payer: Self-pay | Admitting: Family Medicine

## 2017-11-30 ENCOUNTER — Ambulatory Visit: Payer: PPO | Admitting: Family Medicine

## 2017-11-30 VITALS — BP 120/66 | HR 91 | Temp 97.8°F | Ht 68.0 in | Wt 249.0 lb

## 2017-11-30 DIAGNOSIS — L209 Atopic dermatitis, unspecified: Secondary | ICD-10-CM | POA: Diagnosis not present

## 2017-11-30 DIAGNOSIS — L03116 Cellulitis of left lower limb: Secondary | ICD-10-CM

## 2017-11-30 MED ORDER — HYDROXYZINE HCL 25 MG PO TABS: 25.0000 mg | ORAL_TABLET | Freq: Three times a day (TID) | ORAL | 0 refills | Status: DC | PRN

## 2017-11-30 MED ORDER — TRIAMCINOLONE ACETONIDE 0.025 % EX OINT: 1.0000 "application " | TOPICAL_OINTMENT | Freq: Two times a day (BID) | CUTANEOUS | 0 refills | Status: DC

## 2017-11-30 MED ORDER — TACROLIMUS 0.03 % EX OINT
TOPICAL_OINTMENT | Freq: Two times a day (BID) | CUTANEOUS | 0 refills | Status: DC
Start: 2017-11-30 — End: 2017-12-16

## 2017-11-30 NOTE — Progress Notes (Signed)
Shawn Wood is a 67 y.o. male here for an acute visit.  History of Present Illness:   Shawn BottomJamie Wood CMA acting as scribe for Dr. Earlene PlaterWallace.  HPI: Patient comes in today for acute visit. He stated that on 11/19/17 his left leg started with a rash. Since then it has spread from the ankle to the knee on the left leg. He stated that "fluid was leaking yesterday". There was heat in the area yesterday as well. He went to [facility] yesterday and was prescribed Bactrim for this. Since on the Bactrim he does not have heat in the leg and the fluid has stopped leaking.   PMHx, SurgHx, SocialHx, Medications, and Allergies were reviewed in the Visit Navigator and updated as appropriate.  Current Medications:   .  aspirin EC 81 MG tablet, Take 81 mg by mouth every evening. , Disp: , Rfl:  .  cetirizine (ZYRTEC) 10 MG tablet, Take 1 tablet (10 mg total) by mouth daily., Disp: 30 tablet, Rfl: 11 .  diclofenac (VOLTAREN) 75 MG EC tablet, TAKE 1 TABLET BY MOUTH 2 TIMES DAILY, Disp: 60 tablet, Rfl: 3 .  PARoxetine (PAXIL) 40 MG tablet, TAKE 1 TABLET BY MOUTH EVERY DAY, Disp: 90 tablet, Rfl: 2 .  triamcinolone cream (KENALOG) 0.1 %, Apply 1 application topically 2 (two) times daily., Disp: 30 g, Rfl: 0   No Known Allergies   Review of Systems:   Pertinent items are noted in the HPI. Otherwise, ROS is negative.  Vitals:   Vitals:   11/30/17 1250  BP: 120/66  Pulse: 91  Temp: 97.8 F (36.6 C)  TempSrc: Oral  SpO2: 96%  Weight: 249 lb (112.9 kg)  Height: 5\' 8"  (1.727 m)     Body mass index is 37.86 kg/m.  Physical Exam:   Physical Exam  Constitutional: He is oriented to person, place, and time. He appears well-developed and well-nourished. No distress.  HENT:  Head: Normocephalic and atraumatic.  Right Ear: External ear normal.  Left Ear: External ear normal.  Nose: Nose normal.  Mouth/Throat: Oropharynx is clear and moist.  Eyes: Conjunctivae and EOM are normal. Pupils are equal, round,  and reactive to light.  Neck: Normal range of motion. Neck supple.  Cardiovascular: Normal rate, regular rhythm, normal heart sounds and intact distal pulses.  Pulmonary/Chest: Effort normal and breath sounds normal.  Abdominal: Soft. Bowel sounds are normal.  Musculoskeletal: Normal range of motion.  Neurological: He is alert and oriented to person, place, and time.  Skin: Skin is warm and dry. There is erythema.     Psychiatric: He has a normal mood and affect. His behavior is normal. Judgment and thought content normal.  Nursing note and vitals reviewed.   Assessment and Plan:   1. Left leg cellulitis Improving with Septra that was started yesterday. No streaking or signs of DVT. Patient with severe atopic versus stasis dermatitis. No true edema today and no picture to review from yesterday. Since he is covered with an appropriate antibiotic now, okay triamcinolone and hydroxyzine for severe itch. Reviewed red flags at length. Follow up in one to two weeks with PCP.   2. Atopic dermatitis Legs and face. See above for leg. For face, Rx Protopic. Red flags reviewed. Follow up in one to weeks with PCP.   - triamcinolone (KENALOG) 0.025 % ointment; Apply 1 application topically 2 (two) times daily.  Dispense: 454 g; Refill: 0 - hydrOXYzine (ATARAX/VISTARIL) 25 MG tablet; Take 1 tablet (25 mg  total) by mouth every 8 (eight) hours as needed for anxiety (insomnia).  Dispense: 30 tablet; Refill: 0 - tacrolimus (PROTOPIC) 0.03 % ointment; Apply topically 2 (two) times daily. For the face  Dispense: 100 g; Refill: 0   . Reviewed expectations re: course of current medical issues. . Discussed self-management of symptoms. . Outlined signs and symptoms indicating need for more acute intervention. . Patient verbalized understanding and all questions were answered. Marland Kitchen Health Maintenance issues including appropriate healthy diet, exercise, and smoking avoidance were discussed with patient. . See  orders for this visit as documented in the electronic medical record. . Patient received an After Visit Summary.  CMA served as Neurosurgeon during this visit. History, Physical, and Plan performed by medical provider. The above documentation has been reviewed and is accurate and complete. Helane Rima, D.O.   Helane Rima, DO [place], [place] [facility] 11/30/2017

## 2017-12-16 ENCOUNTER — Encounter: Payer: Self-pay | Admitting: Family Medicine

## 2017-12-16 ENCOUNTER — Ambulatory Visit (INDEPENDENT_AMBULATORY_CARE_PROVIDER_SITE_OTHER): Payer: PPO | Admitting: Family Medicine

## 2017-12-16 VITALS — BP 116/80 | HR 73 | Temp 98.5°F | Ht 68.0 in | Wt 250.0 lb

## 2017-12-16 DIAGNOSIS — E785 Hyperlipidemia, unspecified: Secondary | ICD-10-CM | POA: Diagnosis not present

## 2017-12-16 DIAGNOSIS — F325 Major depressive disorder, single episode, in full remission: Secondary | ICD-10-CM | POA: Diagnosis not present

## 2017-12-16 DIAGNOSIS — L03119 Cellulitis of unspecified part of limb: Secondary | ICD-10-CM

## 2017-12-16 DIAGNOSIS — R739 Hyperglycemia, unspecified: Secondary | ICD-10-CM

## 2017-12-16 DIAGNOSIS — L02419 Cutaneous abscess of limb, unspecified: Secondary | ICD-10-CM | POA: Diagnosis not present

## 2017-12-16 NOTE — Progress Notes (Signed)
Subjective:  Shawn Wood is a 67 y.o. year old very pleasant male patient who presents for/with See problem oriented charting ROS- itching on left leg improved- redness improved some but still far more red than right leg.    Past Medical History-  Patient Active Problem List   Diagnosis Date Noted  . Atypical chest pain 11/07/2014    Priority: Medium  . Hyperlipidemia, unspecified 06/04/2014    Priority: Medium  . Depression 06/03/2014    Priority: Medium  . Obesity, unspecified 06/03/2014    Priority: Medium  . Hyperglycemia 04/25/2013    Priority: Medium  . CELLULITIS, LEG, LEFT 11/06/2009    Priority: Medium  . Unilateral primary osteoarthritis, right knee 11/24/2016    Priority: Low  . Hematuria 06/02/2014    Priority: Low  . Traumatic compression fracture of thoracic vertebra (HCC) 03/04/2013    Priority: Low    Class: Acute  . Fracture of multiple ribs 03/04/2013    Priority: Low    Class: Acute  . Closed right scapular fracture 03/03/2013    Priority: Low    Class: Acute  . COLONIC POLYPS, HX OF 05/25/2007    Priority: Low  . Bilateral primary osteoarthritis of knee 02/01/2017    Medications- reviewed and updated Current Outpatient Medications  Medication Sig Dispense Refill  . aspirin EC 81 MG tablet Take 81 mg by mouth every evening.     . cetirizine (ZYRTEC) 10 MG tablet Take 1 tablet (10 mg total) by mouth daily. 30 tablet 11  . diclofenac (VOLTAREN) 75 MG EC tablet TAKE 1 TABLET BY MOUTH 2 TIMES DAILY 60 tablet 3  . hydrOXYzine (ATARAX/VISTARIL) 25 MG tablet Take 1 tablet (25 mg total) by mouth every 8 (eight) hours as needed for anxiety (insomnia). 30 tablet 0  . PARoxetine (PAXIL) 40 MG tablet TAKE 1 TABLET BY MOUTH EVERY DAY 90 tablet 2   No current facility-administered medications for this visit.     Objective: BP 116/80 (BP Location: Left Arm, Patient Position: Sitting, Cuff Size: Large)   Pulse 73   Temp 98.5 F (36.9 C)   Ht 5\' 8"  (1.727 m)    Wt 250 lb (113.4 kg)   SpO2 95%   BMI 38.01 kg/m  Gen: NAD, resting comfortably CV: RRR no murmurs rubs or gallops Lungs: CTAB no crackles, wheeze, rhonchi Abdomen: soft/nontender/nondistended/normal bowel sounds. No rebound or guarding.  Ext: some edema both legs trace. L >R. Left leg with erythema and some signs of excoriation. No warmth or tenderness.   Assessment/Plan:  Hyperglycemia S: Weight up 6 lbs from last year. 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 We will update a1c at CPE after we give him trial of weight loss  Depression S: remains on paxil 40mg . phq9 of 0 3 (goal under 5) A/P: major depression- full remission. doing very well- continue current medicine. Stress of caring for son with chronic seizure disorder definitely contributes  CELLULITIS, LEG, LEFT S: issues with recurrent red itchy leg. At times hot and warm and thought to be cellulitis. Was placed on 7 day course of septra- improve dsome but still itches. Using triamcinolone twice a day still almost 2 weeks out.   Sensitive skin- dove soap and tide fragrane free A/P:Rash/?cellulitis vs sensitive skin/eczema type reaction- itching much improved. recommended at this point  transitioning to eucerin twice a day. Can still use hydroxyzine if needed for itch  He tells me he has seen dermatology in the past and not found very helpful- does not wish to return  Hyperlipidemia, unspecified S: poorly controlled on no statin- risk has been 12.8% on last check  Lab Results  Component Value Date   CHOL 169 12/09/2016   HDL 54.40 12/09/2016   LDLCALC 103 (H) 12/09/2016   TRIG 59.0 12/09/2016   CHOLHDL 3 12/09/2016   A/P: No statin at present due to increased DM risk and already increased a1c. Diet and exercise changes have been advised. Will need to update this at CPE  next visit   Schedule a physical for you and your wife in 3-5 months  I apologized to him about the time he was told he was no longer my patient after being seen at [facility] once  Return precautions advised.  Tana ConchStephen Rayen Dafoe, MD

## 2017-12-16 NOTE — Patient Instructions (Addendum)
recommended at this point transitioning to eucerin twice a day. Can still use hydroxyzine if needed for itch  Schedule a physical for you and your wife in 3-5 months  I will have Asher MuirJamie or Kirt BoysMolly give you a phq9 to fill out but glad you feel the depression is under control

## 2017-12-17 NOTE — Assessment & Plan Note (Signed)
S: poorly controlled on no statin- risk has been 12.8% on last check  Lab Results  Component Value Date   CHOL 169 12/09/2016   HDL 54.40 12/09/2016   LDLCALC 103 (H) 12/09/2016   TRIG 59.0 12/09/2016   CHOLHDL 3 12/09/2016   A/P: No statin at present due to increased DM risk and already increased a1c. Diet and exercise changes have been advised. Will need to update this at CPE next visit

## 2017-12-17 NOTE — Assessment & Plan Note (Signed)
S: issues with recurrent red itchy leg. At times hot and warm and thought to be cellulitis. Was placed on 7 day course of septra- improve dsome but still itches. Using triamcinolone twice a day still almost 2 weeks out.   Sensitive skin- dove soap and tide fragrane free A/P:Rash/?cellulitis vs sensitive skin/eczema type reaction- itching much improved. recommended at this point transitioning to eucerin twice a day. Can still use hydroxyzine if needed for itch  He tells me he has seen dermatology in the past and not found very helpful- does not wish to return

## 2017-12-17 NOTE — Assessment & Plan Note (Signed)
S: Weight up 6 lbs from last year. 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 We will update a1c at CPE after we give him trial of weight loss

## 2017-12-17 NOTE — Assessment & Plan Note (Addendum)
S: remains on paxil 40mg . phq9 of 0 3 (goal under 5) A/P: major depression- full remission. doing very well- continue current medicine. Stress of caring for son with chronic seizure disorder definitely contributes

## 2018-01-09 ENCOUNTER — Other Ambulatory Visit: Payer: Self-pay | Admitting: Family Medicine

## 2018-02-08 ENCOUNTER — Other Ambulatory Visit (INDEPENDENT_AMBULATORY_CARE_PROVIDER_SITE_OTHER): Payer: Self-pay | Admitting: Orthopaedic Surgery

## 2018-02-08 NOTE — Telephone Encounter (Signed)
Ok for refill? 

## 2018-03-06 ENCOUNTER — Encounter: Payer: PPO | Admitting: Family Medicine

## 2018-03-10 ENCOUNTER — Ambulatory Visit (INDEPENDENT_AMBULATORY_CARE_PROVIDER_SITE_OTHER): Payer: PPO | Admitting: Family Medicine

## 2018-03-10 ENCOUNTER — Encounter: Payer: Self-pay | Admitting: Family Medicine

## 2018-03-10 VITALS — BP 130/84 | HR 80 | Temp 98.4°F | Ht 68.0 in | Wt 252.2 lb

## 2018-03-10 DIAGNOSIS — R739 Hyperglycemia, unspecified: Secondary | ICD-10-CM | POA: Diagnosis not present

## 2018-03-10 DIAGNOSIS — E785 Hyperlipidemia, unspecified: Secondary | ICD-10-CM | POA: Diagnosis not present

## 2018-03-10 DIAGNOSIS — Z0000 Encounter for general adult medical examination without abnormal findings: Secondary | ICD-10-CM | POA: Diagnosis not present

## 2018-03-10 DIAGNOSIS — Z125 Encounter for screening for malignant neoplasm of prostate: Secondary | ICD-10-CM

## 2018-03-10 DIAGNOSIS — Z1159 Encounter for screening for other viral diseases: Secondary | ICD-10-CM

## 2018-03-10 DIAGNOSIS — F3342 Major depressive disorder, recurrent, in full remission: Secondary | ICD-10-CM

## 2018-03-10 LAB — LIPID PANEL
CHOL/HDL RATIO: 3
Cholesterol: 183 mg/dL (ref 0–200)
HDL: 52.7 mg/dL (ref >39.00)
LDL CALC: 113 mg/dL — AB (ref 0–99)
NONHDL: 130.17
TRIGLYCERIDES: 86 mg/dL (ref 0.0–149.0)
VLDL: 17.2 mg/dL (ref 0.0–40.0)

## 2018-03-10 LAB — POC URINALSYSI DIPSTICK (AUTOMATED)
Bilirubin, UA: NEGATIVE
Blood, UA: NEGATIVE
GLUCOSE UA: NEGATIVE
Ketones, UA: NEGATIVE
Leukocytes, UA: NEGATIVE
NITRITE UA: NEGATIVE
Protein, UA: NEGATIVE
SPEC GRAV UA: 1.02 (ref 1.010–1.025)
UROBILINOGEN UA: 2 U/dL — AB
pH, UA: 7 (ref 5.0–8.0)

## 2018-03-10 LAB — CBC
HCT: 44.8 % (ref 39.0–52.0)
Hemoglobin: 15.2 g/dL (ref 13.0–17.0)
MCHC: 33.9 g/dL (ref 30.0–36.0)
MCV: 89.7 fl (ref 78.0–100.0)
Platelets: 262 10*3/uL (ref 150.0–400.0)
RBC: 5 Mil/uL (ref 4.22–5.81)
RDW: 13.5 % (ref 11.5–15.5)
WBC: 4.6 10*3/uL (ref 4.0–10.5)

## 2018-03-10 LAB — PSA: PSA: 0.67 ng/mL (ref 0.10–4.00)

## 2018-03-10 LAB — COMPREHENSIVE METABOLIC PANEL
ALT: 15 U/L (ref 0–53)
AST: 17 U/L (ref 0–37)
Albumin: 4.3 g/dL (ref 3.5–5.2)
Alkaline Phosphatase: 60 U/L (ref 39–117)
BUN: 20 mg/dL (ref 6–23)
CALCIUM: 9.3 mg/dL (ref 8.4–10.5)
CHLORIDE: 104 meq/L (ref 96–112)
CO2: 26 meq/L (ref 19–32)
Creatinine, Ser: 0.98 mg/dL (ref 0.40–1.50)
GFR: 80.62 mL/min (ref >60.00)
GLUCOSE: 112 mg/dL — AB (ref 70–99)
Potassium: 4.4 mEq/L (ref 3.5–5.1)
SODIUM: 138 meq/L (ref 135–145)
Total Bilirubin: 0.8 mg/dL (ref 0.2–1.2)
Total Protein: 7.2 g/dL (ref 6.0–8.3)

## 2018-03-10 LAB — HEMOGLOBIN A1C: Hgb A1c MFr Bld: 5.9 % (ref 4.6–6.5)

## 2018-03-10 NOTE — Progress Notes (Signed)
Patient prefers phone call- he is not sure if he can log into mychart still  Your CBC was normal (blood counts, infection fighting cells, platelets). Your CMET was normal (kidney, liver, and electrolytes, blood sugar) except for blood sugar being in prediabetes range.  It is slightly decreased from last year though.At risk for diabetes with hemoglobin a1c of 5.9 (at risk from 5.7-6.4). Healthy eating, regular exercise, weight loss advised. Above 6 we could consider metformin to help prevent diabetes. Your PSA is low risk for prostate cancer Your cholesterol Remains very elevated   As does your 10-year risk of heart attack or stroke.  Team- let send in atorvastatin 20 mg #13 with 3 refills to be used once a week

## 2018-03-10 NOTE — Assessment & Plan Note (Signed)
Hyperglycemia-weight up again.  We will update another A1c.  Advised importance of weight loss.  If A1c gets above 6 consider metformin Lab Results  Component Value Date   HGBA1C 5.7 06/11/2017

## 2018-03-10 NOTE — Patient Instructions (Addendum)
Health Maintenance Due  Topic Date Due  . Hepatitis C Screening - today Jul 25, 1951   I would also like for you to sign up for an annual wellness visit with one of our nurses, Cassie or Darl Pikes, who both specialize in the annual wellness visit. This is a free benefit under medicare that may help [place] find additional ways to help you. Some highlights are reviewing medications, lifestyle, and doing a dementia screen. Schedule this in about 6 months-this is a good chance to check in on your weight.  I want you to set a goal of 5-10 pounds off at least by that visit  Consider getting shingrix shot at your pharmacy if you can find it  Please call GI to follow up for colonoscopy [phone]  Please stop by lab before you go

## 2018-03-10 NOTE — Assessment & Plan Note (Signed)
Depression (major recurrent in full remission) remains well controlled on Paxil 40 mg.  PHQ 9 score of 3 in February and he denies worsening symptoms- now down to a 0.

## 2018-03-10 NOTE — Addendum Note (Signed)
Addended by: London Sheer T on: 03/10/2018 11:58 AM   Modules accepted: Orders

## 2018-03-10 NOTE — Progress Notes (Signed)
Phone: [phone]  Subjective:  Patient presents today for their annual physical. Chief complaint-noted.   See problem oriented charting- ROS- full  review of systems was completed and negative including no chest pain or shortness of breath or headache or blurry vision  The following were reviewed and entered/updated in epic: Past Medical History:  Diagnosis Date  . COLONIC POLYPS, HX OF 05/25/2007  . HYPERGLYCEMIA 08/07/2008  . Leg pain right leg  . TINEA PEDIS 07/19/2007  . Varicose veins    history of cellulitis   Patient Active Problem List   Diagnosis Date Noted  . Atypical chest pain 11/07/2014    Priority: Medium  . Hyperlipidemia, unspecified 06/04/2014    Priority: Medium  . Depression 06/03/2014    Priority: Medium  . Morbid obesity (HCC) 06/03/2014    Priority: Medium  . Hyperglycemia 04/25/2013    Priority: Medium  . CELLULITIS, LEG, LEFT 11/06/2009    Priority: Medium  . Unilateral primary osteoarthritis, right knee 11/24/2016    Priority: Low  . Hematuria 06/02/2014    Priority: Low  . Traumatic compression fracture of thoracic vertebra (HCC) 03/04/2013    Priority: Low    Class: Acute  . Fracture of multiple ribs 03/04/2013    Priority: Low    Class: Acute  . Closed right scapular fracture 03/03/2013    Priority: Low    Class: Acute  . COLONIC POLYPS, HX OF 05/25/2007    Priority: Low  . Bilateral primary osteoarthritis of knee 02/01/2017   Past Surgical History:  Procedure Laterality Date  . KNEE SURGERY  1999   fracture  . VARICOSE VEIN SURGERY      Family History  Problem Relation Age of Onset  . Breast cancer Mother   . Prostate cancer Father        late 48s  . Colon cancer Father        late 41s, did not have colonscopies  . Brain cancer Sister   . Seizures Son     Medications- reviewed and updated Current Outpatient Medications  Medication Sig Dispense Refill  . diclofenac (VOLTAREN) 75 MG EC tablet TAKE 1 TABLET BY MOUTH 2  TIMES DAILY 60 tablet 3  . PARoxetine (PAXIL) 40 MG tablet TAKE 1 TABLET BY MOUTH EVERY DAY 90 tablet 2   No current facility-administered medications for this visit.     Allergies-reviewed and updated No Known Allergies  Social History   Social History Narrative   Married 41 years (wife Charity fundraiser at [employer]) 2015 with 2 sons. 1 son with epilepsy stays at home and still has seizures. No grandkids.       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        Objective: BP 130/84 (BP Location: Left Arm, Patient Position: Sitting, Cuff Size: Normal)   Pulse 80   Temp 98.4 F (36.9 C) (Oral)   Ht  (1.727 m)   Wt 252 lb 3.2 oz (114.4 kg)   SpO2 97%   BMI 38.35 kg/m  Gen: NAD, resting comfortably HEENT: Mucous membranes are moist. Oropharynx normal Neck: no thyromegaly CV: RRR no murmurs rubs or gallops Lungs: CTAB no crackles, wheeze, rhonchi Abdomen: soft/nontender/nondistended/normal bowel sounds. No rebound or  guarding.  Obese Ext: no edema Skin: warm, dry Neuro: grossly normal, moves all extremities, PERRLA Rectal: normal tone, normal sized prostate, no masses or tenderness  Assessment/Plan:  67 y.o. male presenting for annual physical.  Health Maintenance counseling: 1. Anticipatory guidance: Patient counseled regarding regular dental exams -q6 months, eye exams -yearly, wearing seatbelts.  2. Risk factor reduction:  Advised patient of need for regular exercise and diet rich and fruits and vegetables to reduce risk of heart attack and stroke.  Weight up 8 pounds from last physical. He thinks he needs to pick up his physical activity. He has cut out sodas for most part which is a big change for him.  Wt Readings from Last 3 Encounters:   03/10/18 252 lb 3.2 oz (114.4 kg)  12/16/17 250 lb (113.4 kg)  11/30/17 249 lb (112.9 kg)  3. Immunizations/screenings/ancillary studies-discussed Shingrix at pharmacy Immunization History  Administered Date(s) Administered  . Influenza Split 08/16/2011, 08/22/2012  . Influenza Whole 11/01/2001, 08/16/2007, 08/07/2008, 08/13/2009, 07/23/2010  . Influenza, High Dose Seasonal PF 07/28/2015, 09/14/2017  . Influenza,inj,Quad PF,6+ Mos 08/15/2013, 08/14/2014  . Influenza-Unspecified 08/02/2016  . Pneumococcal Conjugate-13 11/28/2015  . Pneumococcal Polysaccharide-23 06/03/2014  . Tdap 08/07/2008, 03/01/2011, 12/10/2012  . Zoster 03/01/2011  4. Prostate cancer screening- low risk PSA trend.  Discussed rectal exam- low risk Lab Results  Component Value Date   PSA 0.55 12/09/2016   PSA 0.54 11/21/2015   PSA 0.68 08/22/2015   5. Colon cancer screening - done in 2011 and advised 5-year repeat.  Encouraged patient again. He declines [place] putting in another referral- he agrees to call 6. Skin cancer screening-sees dermatology yearly.  Advised regular sunscreen use. Denies worrisome, changing, or new skin lesions.   Status of chronic or acute concerns   Morbid obesity (HCC) BMI over 35.  Also has hyperlipidemia, hyperglycemia and arthritis of the knees.  We discussed importance of weight loss to help control all of these issues.  Hyperlipidemia, unspecified Hyperlipidemia- we have wanted to avoid statin as may increase diabetes risk.  Perhaps use once a week statin.  His ten-year risk of heart attack or stroke is been above 12%-if it remains above that consider medication. Has had hematuria in past and had urology evaluation- will update UA under hyperlipidemia  Depression Depression (major recurrent in full remission) remains well controlled on Paxil 40 mg.  PHQ 9 score of 3 in February and he denies worsening symptoms- now down to a 0.   Hyperglycemia Hyperglycemia-weight up again.  We will  update another A1c.  Advised importance of weight loss.  If A1c gets above 6 consider metformin Lab Results  Component Value Date   HGBA1C 5.7 06/11/2017     No future appointments. Return in about 6 months (around 09/10/2018) for Annual wellness visit with nurse.  Lab/Order associations: Preventative health care - Plan: CBC, Comprehensive metabolic panel, Lipid panel, Hepatitis C antibody, PSA, POCT Urinalysis Dipstick (Automated), Hemoglobin A1c  Hyperlipidemia, unspecified hyperlipidemia type - Plan: CBC, Comprehensive metabolic panel, Lipid panel, POCT Urinalysis Dipstick (Automated)  Hyperglycemia - Plan: Hemoglobin A1c  Screening for prostate cancer - Plan: PSA  Encounter for hepatitis C screening test for low risk patient - Plan: Hepatitis C antibody  Return precautions advised.  Tana Conch, MD

## 2018-03-10 NOTE — Assessment & Plan Note (Signed)
BMI over 35.  Also has hyperlipidemia, hyperglycemia and arthritis of the knees.  We discussed importance of weight loss to help control all of these issues.

## 2018-03-10 NOTE — Assessment & Plan Note (Signed)
Hyperlipidemia- we have wanted to avoid statin as may increase diabetes risk.  Perhaps use once a week statin.  His ten-year risk of heart attack or stroke is been above 12%-if it remains above that consider medication. Has had hematuria in past and had urology evaluation- will update UA under hyperlipidemia

## 2018-03-11 LAB — HEPATITIS C ANTIBODY
Hepatitis C Ab: NONREACTIVE
SIGNAL TO CUT-OFF: 0.04 (ref <1.00)

## 2018-03-14 ENCOUNTER — Telehealth: Payer: Self-pay | Admitting: Family Medicine

## 2018-03-14 NOTE — Telephone Encounter (Addendum)
Left message for pt's wife to return call to office for lab results. See result note

## 2018-03-14 NOTE — Telephone Encounter (Signed)
Copied from CRM #[id number]. Topic: Quick Communication - See Telephone Encounter >> Mar 14, 2018  9:34 AM Arlyss Gandy, NT wrote: CRM for notification. See Telephone encounter for: 03/14/18. Pts wife calling back to receive lab results for her husband. Wife is on [place].

## 2018-03-16 ENCOUNTER — Other Ambulatory Visit: Payer: Self-pay

## 2018-03-16 MED ORDER — ATORVASTATIN CALCIUM 20 MG PO TABS: 20.0000 mg | ORAL_TABLET | ORAL | 3 refills | Status: DC

## 2018-03-16 NOTE — Telephone Encounter (Signed)
Wife calling requesting we call patient with results at [phone]

## 2018-03-16 NOTE — Telephone Encounter (Signed)
Left message for pt to call office for lab results

## 2018-06-27 DIAGNOSIS — H2512 Age-related nuclear cataract, left eye: Secondary | ICD-10-CM | POA: Diagnosis not present

## 2018-06-27 DIAGNOSIS — H25812 Combined forms of age-related cataract, left eye: Secondary | ICD-10-CM | POA: Diagnosis not present

## 2018-06-27 DIAGNOSIS — H25012 Cortical age-related cataract, left eye: Secondary | ICD-10-CM | POA: Diagnosis not present

## 2018-07-24 ENCOUNTER — Encounter: Payer: Self-pay | Admitting: Family Medicine

## 2018-07-24 ENCOUNTER — Ambulatory Visit (INDEPENDENT_AMBULATORY_CARE_PROVIDER_SITE_OTHER): Payer: PPO

## 2018-07-24 DIAGNOSIS — Z23 Encounter for immunization: Secondary | ICD-10-CM | POA: Diagnosis not present

## 2018-07-24 NOTE — Progress Notes (Signed)
Patient in today for Flu shot. VIS given. Administered in left arm. Patient tolerated well 

## 2018-07-24 NOTE — Patient Instructions (Signed)
Health Maintenance Due  Topic Date Due  . COLONOSCOPY  04/01/2015    [facility] 03/10/2018 12/16/2017 06/13/2017  Decreased Interest 0 0 0  Down, Depressed, Hopeless 0 1 0  PHQ - 2 Score 0 1 0  Altered sleeping 0 0 -  Tired, decreased energy 0 1 -  Change in appetite 0 0 -  Feeling bad or failure about yourself  0 1 -  Trouble concentrating 0 0 -  Moving slowly or fidgety/restless 0 0 -  Suicidal thoughts 0 0 -  PHQ-9 Score 0 3 -  Difficult doing work/chores Somewhat difficult Not difficult at all -

## 2018-08-01 DIAGNOSIS — H2511 Age-related nuclear cataract, right eye: Secondary | ICD-10-CM | POA: Diagnosis not present

## 2018-08-01 DIAGNOSIS — H25811 Combined forms of age-related cataract, right eye: Secondary | ICD-10-CM | POA: Diagnosis not present

## 2018-09-21 ENCOUNTER — Ambulatory Visit: Payer: PPO

## 2018-10-23 ENCOUNTER — Other Ambulatory Visit: Payer: Self-pay | Admitting: Family Medicine

## 2018-10-23 ENCOUNTER — Other Ambulatory Visit (INDEPENDENT_AMBULATORY_CARE_PROVIDER_SITE_OTHER): Payer: Self-pay | Admitting: Orthopaedic Surgery

## 2018-10-23 NOTE — Telephone Encounter (Signed)
Ok for refill? 

## 2018-12-25 ENCOUNTER — Telehealth: Payer: Self-pay | Admitting: Family Medicine

## 2018-12-25 NOTE — Telephone Encounter (Signed)
[facility] - [facility] TELEPHONE ADVICE RECORD [facility] Patient Name: Shawn Wood Gender: Male DOB: 04-Sep-1951  Age: 68 Y 8 M 11 D Return Phone Number: [phone] (Primary) Address:  City/State/Zip: [place]  [address] Client [facility] Physician Tana Conch- MD Contact Type Call Who Is Calling Patient / Member / Family / Caregiver Call Type Triage / Clinical Caller Name Othon Pandolfo Relationship To Patient Spouse Return Phone Number [phone] (Primary) Chief Complaint Earache Reason for Call Symptomatic / Request for Health Information Initial Comment - Caller states, husband has respiratory infection for over a week, now also has severe ear pains. Translation No Nurse Assessment Nurse: Durwin Nora, RN, Tresa Endo Date/Time Lamount Cohen Time): 12/23/2018 10:34:03 AM Confirm and document reason for call. If symptomatic, describe symptoms. ---Caller states he has had URI sx for a week and this AM he woke with ear and throat pain. Caller denies fever at this time. Pt has cough, runny/stuffy nose and watery eyes. Has the patient traveled to [place] OR had close contact with a person known to have the novel coronavirus illness in the last 14 days? ---No Does the patient have any new or worsening symptoms? ---Yes Will a triage be completed? ---Yes Related visit to physician within the last 2 weeks? ---No Does the PT have any chronic conditions? (i.e. diabetes, asthma, this includes High risk factors for pregnancy, etc.) ---No Is this a behavioral health or substance abuse call? ---No Guidelines Guideline Title Affirmed Question Affirmed Notes Nurse Date/Time Lamount Cohen Time) Sore Throat Earache also present [place] Clock 12/23/2018 10:36:30 AM Disp. Time Lamount Cohen Time) Disposition Final User 12/23/2018 10:39:10 AM See PCP within 24 Hours Yes  Durwin Nora, RN, Adine Madura Disagree/Comply Disagree PLEASE NOTE:  All timestamps contained within this report are represented as [place] Standard Time. CONFIDENTIALTY NOTICE: This fax transmission is intended only for the addressee.  It contains information that is legally privileged, confidential or otherwise protected from use or disclosure.  If you are not the intended recipient, you are strictly prohibited from reviewing, disclosing, copying using or disseminating any of this information or taking any action in reliance on or regarding this information.  If you have received this fax in error, please notify us immediately by telephone so that we can arrange for its return to [place]. Phone:  [phone], Toll-Free:  [phone], Fax:  [phone] Page: 2 of 2 Call Id: [id number] Caller Understands Yes PreDisposition Call Doctor Care Advice Given Per Guideline SEE PCP WITHIN 24 HOURS: * IF OFFICE WILL BE CLOSED AND NO PCP (PRIMARY CARE PROVIDER) SECONDLEVEL TRIAGE: You need to be seen within the next 24 hours. A clinic or an urgent care center is often a good source of care if your doctor's office is closed or you can't get an appointment. SORE THROAT - For relief of sore throat: * Sip warm chicken broth or apple juice. SOFT DIET: CALL BACK IF: * You become worse. Referrals GO TO FACILITY REFUSED

## 2018-12-25 NOTE — Telephone Encounter (Signed)
LVM for patient to call back and schedule appt today or tomorrow.

## 2018-12-25 NOTE — Telephone Encounter (Signed)
Please call and make this patient an appointment with a provider in our office today if possible.

## 2018-12-26 ENCOUNTER — Ambulatory Visit: Payer: PPO | Admitting: Family Medicine

## 2019-05-01 ENCOUNTER — Ambulatory Visit: Payer: PPO | Admitting: Family Medicine

## 2019-06-07 ENCOUNTER — Telehealth: Payer: Self-pay | Admitting: Family Medicine

## 2019-06-07 NOTE — Telephone Encounter (Signed)
Called both numbers no answer

## 2019-06-07 NOTE — Telephone Encounter (Signed)
Spouse requesting pre visit physical lab orders, please advise

## 2019-06-08 NOTE — Telephone Encounter (Signed)
Called both numbers again and no answer. Pt appt is Monday at this point no reason to try and do labs before appt. Closing chart left vm informing pt of update.

## 2019-06-11 ENCOUNTER — Other Ambulatory Visit: Payer: Self-pay

## 2019-06-11 ENCOUNTER — Ambulatory Visit (INDEPENDENT_AMBULATORY_CARE_PROVIDER_SITE_OTHER): Payer: PPO | Admitting: Family Medicine

## 2019-06-11 ENCOUNTER — Encounter: Payer: Self-pay | Admitting: Family Medicine

## 2019-06-11 VITALS — BP 128/82 | HR 80 | Temp 98.3°F | Ht 68.0 in | Wt 256.2 lb

## 2019-06-11 DIAGNOSIS — E669 Obesity, unspecified: Secondary | ICD-10-CM | POA: Diagnosis not present

## 2019-06-11 DIAGNOSIS — F3342 Major depressive disorder, recurrent, in full remission: Secondary | ICD-10-CM | POA: Diagnosis not present

## 2019-06-11 DIAGNOSIS — Z125 Encounter for screening for malignant neoplasm of prostate: Secondary | ICD-10-CM

## 2019-06-11 DIAGNOSIS — R739 Hyperglycemia, unspecified: Secondary | ICD-10-CM

## 2019-06-11 DIAGNOSIS — Z0000 Encounter for general adult medical examination without abnormal findings: Secondary | ICD-10-CM

## 2019-06-11 DIAGNOSIS — E785 Hyperlipidemia, unspecified: Secondary | ICD-10-CM | POA: Diagnosis not present

## 2019-06-11 LAB — COMPREHENSIVE METABOLIC PANEL
ALT: 19 U/L (ref 0–53)
AST: 18 U/L (ref 0–37)
Albumin: 4.4 g/dL (ref 3.5–5.2)
Alkaline Phosphatase: 66 U/L (ref 39–117)
BUN: 23 mg/dL (ref 6–23)
CO2: 25 mEq/L (ref 19–32)
Calcium: 9.4 mg/dL (ref 8.4–10.5)
Chloride: 103 mEq/L (ref 96–112)
Creatinine, Ser: 0.99 mg/dL (ref 0.40–1.50)
GFR: 74.7 mL/min (ref >60.00)
Glucose, Bld: 105 mg/dL — ABNORMAL HIGH (ref 70–99)
Potassium: 4.3 mEq/L (ref 3.5–5.1)
Sodium: 137 mEq/L (ref 135–145)
Total Bilirubin: 0.7 mg/dL (ref 0.2–1.2)
Total Protein: 7 g/dL (ref 6.0–8.3)

## 2019-06-11 LAB — CBC
HCT: 44.9 % (ref 39.0–52.0)
Hemoglobin: 15.2 g/dL (ref 13.0–17.0)
MCHC: 33.9 g/dL (ref 30.0–36.0)
MCV: 90.6 fl (ref 78.0–100.0)
Platelets: 270 10*3/uL (ref 150.0–400.0)
RBC: 4.96 Mil/uL (ref 4.22–5.81)
RDW: 13.2 % (ref 11.5–15.5)
WBC: 5.7 10*3/uL (ref 4.0–10.5)

## 2019-06-11 LAB — LIPID PANEL
Cholesterol: 194 mg/dL (ref 0–200)
HDL: 50.7 mg/dL (ref >39.00)
LDL Cholesterol: 121 mg/dL — ABNORMAL HIGH (ref 0–99)
NonHDL: 143.22
Total CHOL/HDL Ratio: 4
Triglycerides: 113 mg/dL (ref 0.0–149.0)
VLDL: 22.6 mg/dL (ref 0.0–40.0)

## 2019-06-11 LAB — HEMOGLOBIN A1C: Hgb A1c MFr Bld: 6.2 % (ref 4.6–6.5)

## 2019-06-11 LAB — PSA: PSA: 0.48 ng/mL (ref 0.10–4.00)

## 2019-06-11 MED ORDER — PAROXETINE HCL 40 MG PO TABS: 40.0000 mg | ORAL_TABLET | Freq: Every day | ORAL | 3 refills | Status: DC

## 2019-06-11 MED ORDER — ATORVASTATIN CALCIUM 20 MG PO TABS: 20.0000 mg | ORAL_TABLET | ORAL | 3 refills | Status: DC

## 2019-06-11 MED ORDER — DICLOFENAC SODIUM 75 MG PO TBEC: 75.0000 mg | DELAYED_RELEASE_TABLET | Freq: Two times a day (BID) | ORAL | 1 refills | Status: DC

## 2019-06-11 NOTE — Progress Notes (Signed)
Phone: [phone]   Subjective:  Patient presents today for their annual physical. Chief complaint-noted.   See problem oriented charting- ROS- full  review of systems was completed and negative except for:  Knee arthritis  The following were reviewed and entered/updated in epic: Past Medical History:  Diagnosis Date  . COLONIC POLYPS, HX OF 05/25/2007  . HYPERGLYCEMIA 08/07/2008  . Leg pain right leg  . TINEA PEDIS 07/19/2007  . Varicose veins    history of cellulitis   Patient Active Problem List   Diagnosis Date Noted  . Atypical chest pain 11/07/2014    Priority: Medium  . Hyperlipidemia, unspecified 06/04/2014    Priority: Medium  . Depression 06/03/2014    Priority: Medium  . Morbid obesity (HCC) 06/03/2014    Priority: Medium  . Hyperglycemia 04/25/2013    Priority: Medium  . CELLULITIS, LEG, LEFT 11/06/2009    Priority: Medium  . Unilateral primary osteoarthritis, right knee 11/24/2016    Priority: Low  . Hematuria 06/02/2014    Priority: Low  . Traumatic compression fracture of thoracic vertebra (HCC) 03/04/2013    Priority: Low    Class: Acute  . Fracture of multiple ribs 03/04/2013    Priority: Low    Class: Acute  . Closed right scapular fracture 03/03/2013    Priority: Low    Class: Acute  . COLONIC POLYPS, HX OF 05/25/2007    Priority: Low  . Bilateral primary osteoarthritis of knee 02/01/2017   Past Surgical History:  Procedure Laterality Date  . CATARACT EXTRACTION, BILATERAL     2020  . KNEE SURGERY  1999   fracture  . VARICOSE VEIN SURGERY      Family History  Problem Relation Age of Onset  . Breast cancer Mother   . Prostate cancer Father        late 5070s  . Colon cancer Father        late 870s, did not have colonscopies  . Brain cancer Sister   . Seizures Son     Medications- reviewed and updated Current Outpatient Medications  Medication Sig Dispense Refill  . aspirin EC 81 MG tablet Take 81 mg by mouth daily.    Marland Kitchen.  atorvastatin (LIPITOR) 20 MG tablet Take 1 tablet (20 mg total) by mouth once a week. 13 tablet 3  . diclofenac (VOLTAREN) 75 MG EC tablet Take 1 tablet (75 mg total) by mouth 2 (two) times daily. 180 tablet 1  . PARoxetine (PAXIL) 40 MG tablet Take 1 tablet (40 mg total) by mouth daily. 90 tablet 3   No current facility-administered medications for this visit.     Allergies-reviewed and updated No Known Allergies  Social History   Social History Narrative   Married 41 years (wife Charity fundraiserN at [facility]) 2015 with 2 sons. 1 son with epilepsy stays at home and still has seizures. No grandkids.       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       Objective  Objective:  BP 128/82   Pulse 80   Temp 98.3 F (36.8 C) (Oral)   Ht 5\' 8"  (1.727 m)   Wt 256 lb 3.2 oz (116.2 kg)   SpO2 94%   BMI  38.96 kg/m  Gen: NAD, resting comfortably HEENT: Mucous membranes are moist. Oropharynx normal. Some cerumen in both canals Neck: no thyromegaly or cervical lymphadenopathy CV: RRR no murmurs rubs or gallops Lungs: CTAB no crackles, wheeze, rhonchi Abdomen: soft/nontender/nondistended/normal bowel sounds. No rebound or guarding.  Ext: no edema Skin: warm, dry Neuro: grossly normal, moves all extremities, PERRLA   Assessment and Plan  68 y.o. male presenting for annual physical.  Health Maintenance counseling: 1. Anticipatory guidance: Patient counseled regarding regular dental exams -q6 months, eye exams -yearly,  avoiding smoking and second hand smoke , limiting alcohol to 2 beverages per day - doesn't drink.   2. Risk factor reduction:  Advised patient of need for regular exercise and diet rich and fruits and vegetables to reduce risk of heart attack and  stroke. Exercise- trying to work in yard daily and walking dog 20-25 mins a day - new dog. Diet-he is a good cook- only eats 1 meal a day- encouraged at least 2-3 meals a day. . Weight up 4 lbs from last physical Wt Readings from Last 3 Encounters:  06/11/19 256 lb 3.2 oz (116.2 kg)  03/10/18 252 lb 3.2 oz (114.4 kg)  12/16/17 250 lb (113.4 kg)  3. Immunizations/screenings/ancillary studies- advised fall flu shot . Discussed shingrix at pharmacy.  Immunization History  Administered Date(s) Administered  . Influenza Split 08/16/2011, 08/22/2012  . Influenza Whole 11/01/2001, 08/16/2007, 08/07/2008, 08/13/2009, 07/23/2010  . Influenza, High Dose Seasonal PF 07/28/2015, 09/14/2017, 07/24/2018  . Influenza,inj,Quad PF,6+ Mos 08/15/2013, 08/14/2014  . Influenza-Unspecified 08/02/2016  . Pneumococcal Conjugate-13 11/28/2015  . Pneumococcal Polysaccharide-23 06/03/2014  . Tdap 08/07/2008, 03/01/2011, 12/10/2012  . Zoster 03/01/2011  4. Prostate cancer screening-  low risk psa trend in past- will check again today- consider discontinuing PSA checks after age 75 - in relation to usptf guidelines. Stable urine symptoms.  Lab Results  Component Value Date   PSA 0.67 03/10/2018   PSA 0.55 12/09/2016   PSA 0.54 11/21/2015   5. Colon cancer screening -  03/2010 with 5 year follow up advised- far overdue at this point. Patient did nto call last year as planned. Defers for now due to pandemic 6. Skin cancer screening- sees derm - encouraged him to call for skin cancer screening. advised regular sunscreen use. Denies worrisome, changing, or new skin lesions.  7. 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 Wife still works. Has deep brain stimulator- hoping with next level up its more helpful.   Hyperlipidemia - had sent in Atorvastatin 20 mg once weekly last year- he never started taking this- update lipids and  consider sending in again.   Depression - Taking Paroxetine 40 mg daily. phq9 controlled at 1  Knee OA- diclofenac daily gives enough relief but we need to watch kidney function closely and monitor BP. Knees somewhat better working less.  - encouraged to try every other day to see how he does.  - used to get knee injections form orthopedics.  - has been told eventually needs knee replacement  Obesity morbid-  bmi over 35 with HLD and hyperglycemia/prediabetes/knee OA Hyperglycemia - update a1c today. Encouraged need for healthy eating, regular exercise, weight loss.   Recommended follow up: 6 month follow up to check on kidney function  Lab/Order associations: coffee with creamer this AM     Return precautions advised.  Garret Reddish, MD

## 2019-06-11 NOTE — Patient Instructions (Addendum)
Health Maintenance Due  Topic Date Due  . COLONOSCOPY - [facility] - defers d/t pandemic.  04/01/2015  . INFLUENZA VACCINE -We should have flu shots available by September. Please strongly consider getting flu shot this year. If you get your flu shot at a pharmacy- please let us know.  06/02/2019  -consider shingrix at the pharmacy (2 shot series for shingles)  paxil and diclofenac together can increase rectal bleeding risk- let us know if any blood in stool or dark black stool immediately  Go ahead and start cholesterol medicine once a week- atorvastatin 20 mg  Please stop by lab before you go If you do not have mychart- we will call you about results within 5 business days of [place] receiving them.  If you have mychart- we will send your results within 3 business days of [place] receiving them.  If abnormal or we want to clarify a result, we will call or mychart you to make sure you receive the message.  If you have questions or concerns or don't hear within 5-7 days, please send [place] a message or call us.

## 2019-06-14 ENCOUNTER — Encounter: Payer: Self-pay | Admitting: Family Medicine

## 2019-06-15 MED ORDER — METFORMIN HCL 500 MG PO TABS: 500.0000 mg | ORAL_TABLET | Freq: Every day | ORAL | 3 refills | Status: DC

## 2019-08-01 ENCOUNTER — Ambulatory Visit (INDEPENDENT_AMBULATORY_CARE_PROVIDER_SITE_OTHER): Payer: PPO

## 2019-08-01 ENCOUNTER — Other Ambulatory Visit: Payer: Self-pay

## 2019-08-01 DIAGNOSIS — Z23 Encounter for immunization: Secondary | ICD-10-CM

## 2019-10-04 ENCOUNTER — Telehealth: Payer: Self-pay | Admitting: Family Medicine

## 2019-10-04 NOTE — Telephone Encounter (Signed)
I left a message asking the patient to call and schedule Medicare AWV with Courtney ([facility]).  If patient calls back, please schedule Medicare Wellness Visit (initial) at next available opening.  VDM (Dee-Dee) 

## 2019-10-15 ENCOUNTER — Other Ambulatory Visit: Payer: Self-pay | Admitting: Family Medicine

## 2019-11-22 ENCOUNTER — Telehealth: Payer: Self-pay | Admitting: Family Medicine

## 2019-11-22 NOTE — Telephone Encounter (Signed)
I left a message asking the patient, spouse and son to call and schedule Medicare AWV with Courtney ([facility]).  If patient calls back, please schedule Medicare Wellness Visit (initial) at next available opening. VDM (Dee-Dee) 

## 2019-12-12 ENCOUNTER — Encounter: Payer: Self-pay | Admitting: Family Medicine

## 2019-12-12 ENCOUNTER — Other Ambulatory Visit: Payer: Self-pay

## 2019-12-12 ENCOUNTER — Ambulatory Visit (INDEPENDENT_AMBULATORY_CARE_PROVIDER_SITE_OTHER): Payer: PPO | Admitting: Family Medicine

## 2019-12-12 VITALS — BP 136/84 | HR 80 | Temp 98.6°F | Ht 68.0 in | Wt 263.8 lb

## 2019-12-12 DIAGNOSIS — Z79899 Other long term (current) drug therapy: Secondary | ICD-10-CM | POA: Diagnosis not present

## 2019-12-12 DIAGNOSIS — M170 Bilateral primary osteoarthritis of knee: Secondary | ICD-10-CM

## 2019-12-12 DIAGNOSIS — R739 Hyperglycemia, unspecified: Secondary | ICD-10-CM

## 2019-12-12 DIAGNOSIS — F3342 Major depressive disorder, recurrent, in full remission: Secondary | ICD-10-CM | POA: Diagnosis not present

## 2019-12-12 DIAGNOSIS — E785 Hyperlipidemia, unspecified: Secondary | ICD-10-CM | POA: Diagnosis not present

## 2019-12-12 LAB — BASIC METABOLIC PANEL
BUN: 19 mg/dL (ref 6–23)
CO2: 29 mEq/L (ref 19–32)
Calcium: 9.3 mg/dL (ref 8.4–10.5)
Chloride: 102 mEq/L (ref 96–112)
Creatinine, Ser: 1.1 mg/dL (ref 0.40–1.50)
GFR: 66.05 mL/min (ref >60.00)
Glucose, Bld: 133 mg/dL — ABNORMAL HIGH (ref 70–99)
Potassium: 4.7 mEq/L (ref 3.5–5.1)
Sodium: 137 mEq/L (ref 135–145)

## 2019-12-12 LAB — HEMOGLOBIN A1C: Hgb A1c MFr Bld: 6.3 % (ref 4.6–6.5)

## 2019-12-12 NOTE — Progress Notes (Signed)
Phone [phone] In person visit   Subjective:   Shawn Wood is a 69 y.o. year old very pleasant male patient who presents for/with See problem oriented charting Chief Complaint  Patient presents with  . Hypertension  . Hyperlipidemia    This visit occurred during the SARS-CoV-2 public health emergency.  Safety protocols were in place, including screening questions prior to the visit, additional usage of staff PPE, and extensive cleaning of exam room while observing appropriate contact time as indicated for disinfecting solutions.   Past Medical History-  Patient Active Problem List   Diagnosis Date Noted  . Bilateral primary osteoarthritis of knee 02/01/2017    Priority: Medium  . Atypical chest pain 11/07/2014    Priority: Medium  . Hyperlipidemia, unspecified 06/04/2014    Priority: Medium  . Depression 06/03/2014    Priority: Medium  . Morbid obesity ([place]) 06/03/2014    Priority: Medium  . Hyperglycemia 04/25/2013    Priority: Medium  . CELLULITIS, LEG, LEFT 11/06/2009    Priority: Medium  . Unilateral primary osteoarthritis, right knee 11/24/2016    Priority: Low  . Hematuria 06/02/2014    Priority: Low  . Traumatic compression fracture of thoracic vertebra (HCC) 03/04/2013    Priority: Low    Class: Acute  . Fracture of multiple ribs 03/04/2013    Priority: Low    Class: Acute  . Closed right scapular fracture 03/03/2013    Priority: Low    Class: Acute  . COLONIC POLYPS, HX OF 05/25/2007    Priority: Low    Medications- reviewed and updated Current Outpatient Medications  Medication Sig Dispense Refill  . aspirin EC 81 MG tablet Take 81 mg by mouth daily.    . diclofenac (VOLTAREN) 75 MG EC tablet Take 1 tablet (75 mg total) by mouth 2 (two) times daily. 180 tablet 1  . PARoxetine (PAXIL) 40 MG tablet TAKE 1 TABLET BY MOUTH EVERY DAY 90 tablet 2   No current facility-administered medications for this visit.     Objective:  BP 136/84   Pulse 80    Temp 98.6 F (37 C) (Temporal)   Ht 5\' 8"  (1.727 m)   Wt 263 lb 12.8 oz (119.7 kg)   SpO2 96%   BMI 40.11 kg/m  Gen: NAD, resting comfortably CV: RRR no murmurs rubs or gallops Lungs: CTAB no crackles, wheeze, rhonchi Ext: no edema Skin: warm, dry     Assessment and Plan  # Hyperlipidemia S:Patent is no longer taking Atorvastatin 20 mg. He tried this but got a sensation of feeling off overall and therefore stopped .   Trying to walk dog 30-40 minutes twice a day.  A/P: unfortunately did not tolerate atorvastatin 20mg  twice a week.  - we mentioned trial of rosuvastatin as alternate- he wants to hold off for now and work on healthy eating/regular exercise/weight loss and recheck next visit.    # Depression S:Taking Paroxetine 40 mg daily. phq9 controlled at 0 Depression screen [facility] 2/9 12/12/2019 06/11/2019 03/10/2018  Decreased Interest 0 0 0  Down, Depressed, Hopeless 0 0 0  PHQ - 2 Score 0 0 0  Altered sleeping 0 0 0  Tired, decreased energy 0 1 0  Change in appetite 0 0 0  Feeling bad or failure about yourself  0 0 0  Trouble concentrating 0 0 0  Moving slowly or fidgety/restless 0 0 0  Suicidal thoughts 0 0 0  PHQ-9 Score 0 1 0  Difficult doing work/chores Not  difficult at all Not difficult at all Somewhat difficult  A/P: good control- continue current meds    # Hyperglycemia/insulin resistance/prediabetes/morbid obesity S: Exercise and diet-  Is doing a great job with walking at least 30 minutes twice a day but tough on knees. Weight up 7 lbs and didn't do the best over the holidays- and late night peanut butter.   Patient is not  compliant with Metformin 500 mg daily with breakfast because felt off on this and atorvastatin same time Lab Results  Component Value Date   HGBA1C 6.2 06/11/2019   HGBA1C 5.9 03/10/2018   HGBA1C 5.7 06/11/2017    A/P: hopeful with his walking that a1c has not increased further- hed like to stay off metformin if possible- will remove  from list but perhaps if #s high- try a lower dose so half tablet once a day and not start statin at same time to see which med was causing his side effect - needs to turn weight gain around from holidays- keep up exercise but needs to buckle down on the diet   # Knee OA S: trying CBD pills for knees- discussed do not have good evidence to guide [place] on use so I cannot formally recommend. Taking voltaren once a day for knee arthritis- reasonable control for most part unless dog really gets to pulling. Knee feels fine at the moment but acts up at other times  Steroid injections help short term. Hyaluronic acid not as helpful. Has seen Dr. Ophelia Charter- he is not interested in knee replacement- would take removal of prior equipment from fracture for 8 weeks before they could even do surgery and he doesn't think he could take this.  A/P: reasonable control- with long term nsaids have to check kidneys regularly- check today   Recommended follow up: Return in about 6 months (around 06/10/2020) for physical or sooner if needed.  Lab/Order associations:   ICD-10-CM   1. Recurrent major depressive disorder, in full remission (HCC)  F33.42   2. Morbid obesity (HCC) Chronic E66.01   3. Hyperlipidemia, unspecified hyperlipidemia type  E78.5 Basic metabolic panel  4. Hyperglycemia  R73.9 Hemoglobin A1c  5. Bilateral primary osteoarthritis of knee  M17.0   6. High risk medication use  Z79.899 Basic metabolic panel   Return precautions advised.  Tana Conch, MD

## 2019-12-12 NOTE — Patient Instructions (Addendum)
Health Maintenance Due  Topic Date Due  . COLONOSCOPY - he wants to get covid 19 shot first 04/01/2015   Please stop by lab before you go If you do not have mychart- we will call you about results within 5 business days of [place] receiving them.  If you have mychart- we will send your results within 3 business days of [place] receiving them.  If abnormal or we want to clarify a result, we will call or mychart you to make sure you receive the message.  If you have questions or concerns or don't hear within 5-7 days, please send [place] a message or call us.   Recommended follow up: Return in about 6 months (around 06/10/2020) for physical or sooner if needed.

## 2020-05-28 ENCOUNTER — Other Ambulatory Visit: Payer: Self-pay | Admitting: Family Medicine

## 2020-06-09 NOTE — Patient Instructions (Addendum)
Please stop by lab before you go If you have mychart- we will send your results within 3 business days of [place] receiving them.  If you do not have mychart- we will call you about results within 5 business days of [place] receiving them.  *please note we are currently using [facility] which has a longer processing time than [place] typically so labs may not come back as quickly as in the past *please also note that you will see labs on mychart as soon as they post. I will later go in and write notes on them- will say "notes from Dr. Durene Cal"  Health Maintenance Due  Topic Date Due  . COVID-19 Vaccine (1)-will send [place] this information through mychart Never done  . COLONOSCOPY -does not want to go around a hospital 04/01/2015  . INFLUENZA VACCINE -please let us know when you have gotten this 06/01/2020   Please check with your pharmacy to see if they have the shingrix vaccine. If they do- please get this immunization and update [place] by phone call or mychart with dates you receive the vaccine   Can get Dramamine at your pharmacy for fishing trip.  Consider coronary calcium scoring, will be $150 out of pocket.  Mineral oil for ear full of wax Purchase mineral oil from laxative aisle Lay down on your side with ear that is bothering you facing up Use 3-4 drops with a dropper and place in ear for 30 seconds Place cotton swab outside of ear Turn to other side and allow this to drain Repeat 3-4 x a day Return to see [place] if not improving within a few days

## 2020-06-09 NOTE — Progress Notes (Signed)
Phone: [phone]   Subjective:  Patient presents today for their annual physical. Chief complaint-noted.   See problem oriented charting- Review of Systems  Constitutional: Negative.   HENT: Negative.   Eyes: Negative.   Respiratory: Negative.   Cardiovascular: Negative.   Gastrointestinal: Negative.   Genitourinary: Negative.   Musculoskeletal: Positive for back pain (moving pews at church) and joint pain (knee pain long term ).  Skin: Negative.   Neurological: Negative.   Endo/Heme/Allergies: Negative.   Psychiatric/Behavioral: Negative.    The following were reviewed and entered/updated in epic: Past Medical History:  Diagnosis Date  . COLONIC POLYPS, HX OF 05/25/2007  . HYPERGLYCEMIA 08/07/2008  . Leg pain right leg  . TINEA PEDIS 07/19/2007  . Varicose veins    history of cellulitis   Patient Active Problem List   Diagnosis Date Noted  . Bilateral primary osteoarthritis of knee 02/01/2017    Priority: Medium  . Atypical chest pain 11/07/2014    Priority: Medium  . Hyperlipidemia, unspecified 06/04/2014    Priority: Medium  . Depression 06/03/2014    Priority: Medium  . Morbid obesity (HCC) 06/03/2014    Priority: Medium  . Hyperglycemia 04/25/2013    Priority: Medium  . CELLULITIS, LEG, LEFT 11/06/2009    Priority: Medium  . Unilateral primary osteoarthritis, right knee 11/24/2016    Priority: Low  . Hematuria 06/02/2014    Priority: Low  . Traumatic compression fracture of thoracic vertebra (HCC) 03/04/2013    Priority: Low    Class: Acute  . Fracture of multiple ribs 03/04/2013    Priority: Low    Class: Acute  . Closed right scapular fracture 03/03/2013    Priority: Low    Class: Acute  . COLONIC POLYPS, HX OF 05/25/2007    Priority: Low   Past Surgical History:  Procedure Laterality Date  . CATARACT EXTRACTION, BILATERAL     2020  . KNEE SURGERY  1999   fracture  . VARICOSE VEIN SURGERY      Family History  Problem Relation Age of  Onset  . Breast cancer Mother   . Prostate cancer Father        late 19s  . Colon cancer Father        late 67s, did not have colonscopies  . Brain cancer Sister   . Seizures Son     Medications- reviewed and updated Current Outpatient Medications  Medication Sig Dispense Refill  . aspirin EC 81 MG tablet Take 81 mg by mouth daily.    . diclofenac (VOLTAREN) 75 MG EC tablet TAKE 1 TABLET BY MOUTH 2 TIMES DAILY 180 tablet 1  . PARoxetine (PAXIL) 40 MG tablet TAKE 1 TABLET BY MOUTH EVERY DAY 90 tablet 2   No current facility-administered medications for this visit.    Allergies-reviewed and updated No Known Allergies  Social History   Social History Narrative   Married 41 years (wife Charity fundraiser at [facility]) 2015 with 2 sons. 1 son with epilepsy stays at home and still has seizures. No grandkids.       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       Objective  Objective:  BP 120/80   Pulse 71   Temp  97.7 F (36.5 C)   Ht 5\' 8"  (1.727 m)   Wt 261 lb 6.4 oz (118.6 kg)   SpO2 95%   BMI 39.75 kg/m  Gen: NAD, resting comfortably HEENT: Mucous membranes are moist. Oropharynx normal.  Cerumen impaction bilaterally-attempted curetting but not able to fully remove-recommended mineral oil trial Neck: no thyromegaly CV: RRR no murmurs rubs or gallops Lungs: CTAB no crackles, wheeze, rhonchi Abdomen: soft/nontender/nondistended/normal bowel sounds. No rebound or guarding.  Ext: no edema Skin: warm, dry Neuro: grossly normal, moves all extremities, PERRLA    Assessment and Plan  69 y.o. male presenting for annual physical.  Health Maintenance counseling: 1. Anticipatory guidance: Patient counseled regarding regular dental exams -q6 months,  eye exams -yearly,  avoiding smoking and second hand smoke , limiting alcohol to 2 beverages per day - doesn't drink.   2. Risk factor reduction:  Advised patient of need for regular exercise and diet rich and fruits and vegetables to reduce risk of heart attack and stroke. Exercise- walking still 2 blocks daily twice a day- 20 mins each time. Diet-  Down 2 lbs from last visit- discussed cutting down on calories. Still does one big meal a day for most part. Loves peanut butter but has cut back some.  Wt Readings from Last 3 Encounters:  06/11/20 261 lb 6.4 oz (118.6 kg)  12/12/19 263 lb 12.8 oz (119.7 kg)  06/11/19 256 lb 3.2 oz (116.2 kg)  3. Immunizations/screenings/ancillary studies- advised fall flu shot when available. shingrix recommended at pharmacy Immunization History  Administered Date(s) Administered  . Fluad Quad(high Dose 65+) 08/01/2019  . Influenza Split 08/16/2011, 08/22/2012  . Influenza Whole 11/01/2001, 08/16/2007, 08/07/2008, 08/13/2009, 07/23/2010  . Influenza, High Dose Seasonal PF 07/28/2015, 09/14/2017, 07/24/2018  . Influenza,inj,Quad PF,6+ Mos 08/15/2013, 08/14/2014, 08/01/2019  . Influenza-Unspecified 08/02/2016  . Pneumococcal Conjugate-13 11/28/2015  . Pneumococcal Polysaccharide-23 06/03/2014  . Tdap 08/07/2008, 03/01/2011, 12/10/2012  . Zoster 03/01/2011   Health Maintenance Due  Topic Date Due  . COVID-19 Vaccine (1)-will send 03/03/2011 this information through mychart Never done  . COLONOSCOPY -does not want to go around a hospital 04/01/2015  . INFLUENZA VACCINE -please let 04/03/2015 know when you have gotten this 06/01/2020   4. Prostate cancer screening- low risk prior PSA trend-trend with labs today Lab Results  Component Value Date   PSA 0.48 06/11/2019   PSA 0.67 03/10/2018   PSA 0.55 12/09/2016   5. Colon cancer screening - May 2011 with 5-year follow-up planned.  Patient does not want to proceed forward due to pandemic-I stressed the precautions that are  being taken and recommended he still proceed with this. He will consider after son gets vaccinated.  6. Skin cancer screening-follows with dermatology. advised regular sunscreen use. Denies worrisome, changing, or new skin lesions.  7.  Never smoker 8. STD screening -declines as married/monogamous  Status of chronic or acute concerns   #Social update-continues to give her son with epilepsy.  Wife is still working.  The brain stimulator has not been effective  # Depression S: Medication:Paxil 40 mg daily Depression screen [facility] 2/9 06/11/2020 12/12/2019 06/11/2019  Decreased Interest 0 0 0  Down, Depressed, Hopeless 0 0 0  PHQ - 2 Score 0 0 0  Altered sleeping 0 0 0  Tired, decreased energy 0 0 1  Change in appetite 0 0 0  Feeling bad or failure about yourself  0 0 0  Trouble concentrating 0 0 0  Moving slowly or fidgety/restless 0 0  0  Suicidal thoughts 0 0 0  PHQ-9 Score 0 0 1  Difficult doing work/chores Not difficult at all Not difficult at all Not difficult at all   A/P: stable/full remission continue current meds  #hyperlipidemia S: Medication:Tried atorvastatin 20 mg once weekly in the past-patient never picked this up Lab Results  Component Value Date   CHOL 194 06/11/2019   HDL 50.70 06/11/2019   LDLCALC 121 (H) 06/11/2019   TRIG 113.0 06/11/2019   CHOLHDL 4 06/11/2019   A/P: consider coronary calcium scoring- he is not super interested. Also declines statin. Continue to work on healthy eating/regular exercise then.   #Knee osteoarthritis-diclofenac daily gives enough relief but we need to watch kidney function closely-also warned of GI bleeding risk with this and Paxil.  Last year was to try every other day.  He is to get injections but only helped short-term as hyaluronic acid was not helpful with orthopedics and was told eventually need replacement- he continues to decline  #Motion sickness S: Pt is getting ready to go on a fishing trip and would like something Rx'd for  motion sickness A/P: discussed dramamine OTC- trip only 2 days   # Hyperglycemia/insulin resistance/prediabetes S:  Medication: none Exercise and diet- walking- slight weight loss Lab Results  Component Value Date   HGBA1C 6.3 12/12/2019   HGBA1C 6.2 06/11/2019   HGBA1C 5.9 03/10/2018    A/P: Discussed potentially using Metformin if numbers are stable or worse-we will get blood work today   Recommended follow up: 6 month follow up   Lab/Order associations: not fasting   ICD-10-CM   1. Preventative health care  Z00.00 CBC with Differential/Platelet    Comprehensive metabolic panel    Lipid panel    PSA  2. Recurrent major depressive disorder, in full remission (HCC)  F33.42   3. Hyperglycemia  R73.9   4. Hyperlipidemia, unspecified hyperlipidemia type  E78.5 CBC with Differential/Platelet    Comprehensive metabolic panel    Lipid panel  5. Screening for prostate cancer  Z12.5 PSA  6. Screening for colon cancer  Z12.11     No orders of the defined types were placed in this encounter.   Return precautions advised.  Tana Conch, MD

## 2020-06-11 ENCOUNTER — Other Ambulatory Visit: Payer: Self-pay

## 2020-06-11 ENCOUNTER — Encounter: Payer: Self-pay | Admitting: Family Medicine

## 2020-06-11 ENCOUNTER — Ambulatory Visit (INDEPENDENT_AMBULATORY_CARE_PROVIDER_SITE_OTHER): Payer: PPO | Admitting: Family Medicine

## 2020-06-11 VITALS — BP 120/80 | HR 71 | Temp 97.7°F | Ht 68.0 in | Wt 261.4 lb

## 2020-06-11 DIAGNOSIS — Z1211 Encounter for screening for malignant neoplasm of colon: Secondary | ICD-10-CM

## 2020-06-11 DIAGNOSIS — E785 Hyperlipidemia, unspecified: Secondary | ICD-10-CM

## 2020-06-11 DIAGNOSIS — F3342 Major depressive disorder, recurrent, in full remission: Secondary | ICD-10-CM | POA: Diagnosis not present

## 2020-06-11 DIAGNOSIS — R739 Hyperglycemia, unspecified: Secondary | ICD-10-CM

## 2020-06-11 DIAGNOSIS — Z0000 Encounter for general adult medical examination without abnormal findings: Secondary | ICD-10-CM

## 2020-06-11 DIAGNOSIS — Z125 Encounter for screening for malignant neoplasm of prostate: Secondary | ICD-10-CM | POA: Diagnosis not present

## 2020-06-11 NOTE — Addendum Note (Signed)
Addended by: Altamese Cabal on: 06/11/2020 11:29 AM   Modules accepted: Orders

## 2020-06-12 LAB — LIPID PANEL
Cholesterol: 185 mg/dL (ref <200)
HDL: 48 mg/dL (ref >=40)
LDL Cholesterol (Calc): 113 mg/dL (calc) — ABNORMAL HIGH
Non-HDL Cholesterol (Calc): 137 mg/dL (calc) — ABNORMAL HIGH (ref <130)
Total CHOL/HDL Ratio: 3.9 (calc) (ref <5.0)
Triglycerides: 129 mg/dL (ref <150)

## 2020-06-12 LAB — CBC WITH DIFFERENTIAL/PLATELET
Absolute Monocytes: 403 cells/uL (ref 200–950)
Basophils Absolute: 48 cells/uL (ref 0–200)
Basophils Relative: 1 %
Eosinophils Absolute: 211 cells/uL (ref 15–500)
Eosinophils Relative: 4.4 %
HCT: 44.3 % (ref 38.5–50.0)
Hemoglobin: 14.8 g/dL (ref 13.2–17.1)
Lymphs Abs: 1522 cells/uL (ref 850–3900)
MCH: 29.9 pg (ref 27.0–33.0)
MCHC: 33.4 g/dL (ref 32.0–36.0)
MCV: 89.5 fL (ref 80.0–100.0)
MPV: 10.4 fL (ref 7.5–12.5)
Monocytes Relative: 8.4 %
Neutro Abs: 2616 cells/uL (ref 1500–7800)
Neutrophils Relative %: 54.5 %
Platelets: 262 10*3/uL (ref 140–400)
RBC: 4.95 10*6/uL (ref 4.20–5.80)
RDW: 12.2 % (ref 11.0–15.0)
Total Lymphocyte: 31.7 %
WBC: 4.8 10*3/uL (ref 3.8–10.8)

## 2020-06-12 LAB — COMPREHENSIVE METABOLIC PANEL
AG Ratio: 1.5 (calc) (ref 1.0–2.5)
ALT: 20 U/L (ref 9–46)
AST: 24 U/L (ref 10–35)
Albumin: 4.2 g/dL (ref 3.6–5.1)
Alkaline phosphatase (APISO): 67 U/L (ref 35–144)
BUN: 19 mg/dL (ref 7–25)
CO2: 27 mmol/L (ref 20–32)
Calcium: 9.4 mg/dL (ref 8.6–10.3)
Chloride: 106 mmol/L (ref 98–110)
Creat: 1.01 mg/dL (ref 0.70–1.18)
Globulin: 2.8 g/dL (calc) (ref 1.9–3.7)
Glucose, Bld: 96 mg/dL (ref 65–99)
Potassium: 4.7 mmol/L (ref 3.5–5.3)
Sodium: 140 mmol/L (ref 135–146)
Total Bilirubin: 0.7 mg/dL (ref 0.2–1.2)
Total Protein: 7 g/dL (ref 6.1–8.1)

## 2020-06-12 LAB — HEMOGLOBIN A1C
Hgb A1c MFr Bld: 6.2 % of total Hgb — ABNORMAL HIGH (ref <5.7)
Mean Plasma Glucose: 131 (calc)
eAG (mmol/L): 7.3 (calc)

## 2020-06-12 LAB — PSA: PSA: 0.3 ng/mL (ref <=4.0)

## 2020-06-19 ENCOUNTER — Encounter: Payer: Self-pay | Admitting: Family Medicine

## 2020-06-20 NOTE — Telephone Encounter (Signed)
FYI

## 2020-07-29 ENCOUNTER — Ambulatory Visit: Payer: PPO

## 2020-07-31 ENCOUNTER — Ambulatory Visit: Payer: PPO

## 2020-08-12 ENCOUNTER — Other Ambulatory Visit: Payer: Self-pay | Admitting: Family Medicine

## 2020-08-12 ENCOUNTER — Other Ambulatory Visit: Payer: Self-pay

## 2020-08-12 ENCOUNTER — Ambulatory Visit: Payer: PPO

## 2020-09-03 ENCOUNTER — Other Ambulatory Visit: Payer: Self-pay

## 2020-09-03 ENCOUNTER — Ambulatory Visit: Payer: PPO

## 2020-09-03 ENCOUNTER — Ambulatory Visit (INDEPENDENT_AMBULATORY_CARE_PROVIDER_SITE_OTHER): Payer: PPO

## 2020-09-03 ENCOUNTER — Encounter: Payer: Self-pay | Admitting: Family Medicine

## 2020-09-03 DIAGNOSIS — Z23 Encounter for immunization: Secondary | ICD-10-CM

## 2020-09-03 NOTE — Progress Notes (Signed)
Patient came into the office to receive his high dose flu shot today. He tolerated the injection well. No questions or concerns at this time.

## 2020-09-03 NOTE — Progress Notes (Signed)
I have reviewed and agree with note, evaluation, plan.   Nicholette Dolson, MD  

## 2020-09-10 ENCOUNTER — Telehealth: Payer: Self-pay | Admitting: Family Medicine

## 2020-09-10 NOTE — Telephone Encounter (Signed)
Left message for patient to call back and schedule Medicare Annual Wellness Visit (AWV) either virtually OR in office.   No hx; please schedule at anytime with [facility]-Nurse Health Advisor at [facility].  This should be a 45 minute visit.   

## 2020-09-23 ENCOUNTER — Other Ambulatory Visit: Payer: Self-pay

## 2020-09-23 ENCOUNTER — Telehealth (INDEPENDENT_AMBULATORY_CARE_PROVIDER_SITE_OTHER): Payer: PPO | Admitting: Family Medicine

## 2020-09-23 DIAGNOSIS — R0981 Nasal congestion: Secondary | ICD-10-CM | POA: Diagnosis not present

## 2020-09-23 DIAGNOSIS — R059 Cough, unspecified: Secondary | ICD-10-CM

## 2020-09-23 MED ORDER — BENZONATATE 100 MG PO CAPS
100.0000 mg | ORAL_CAPSULE | Freq: Three times a day (TID) | ORAL | 0 refills | Status: DC | PRN
Start: 2020-09-23 — End: 2021-12-15

## 2020-09-23 NOTE — Patient Instructions (Signed)
  HOME CARE TIPS:  -[facility] COVID19 testing information: [email or web address] OR [phone] Most pharmacies also offer testing and home test kits.  -I sent the medication(s) we discussed to your pharmacy: Meds ordered this encounter  Medications  . benzonatate (TESSALON PERLES) 100 MG capsule    Sig: Take 1 capsule (100 mg total) by mouth 3 (three) times daily as needed.    Dispense:  20 capsule    Refill:  0     -COVID19 outpatient treatment center: [phone] (only call if your Covid test is positive and you are interested in monoclonal antibody treatment which is available to those with risk factors within 10 days of symptom onset)  -can use tylenol or aleve if needed for fevers, aches and pains per instructions  -can use nasal saline a few times per day if nasal congestion, sometime a short course of Afrin nasal spray for 3 days can help as well  -stay hydrated, drink plenty of fluids and eat small healthy meals - avoid dairy  -can take 1000 IU Vit D3 and Vit C lozenges per instructions  -If the Covid test is positive, check out the CDC website for more information on home care, transmission and treatment for COVID19  -follow up with your doctor in 2-3 days unless improving and feeling better  -stay home while sick, except to seek medical care, and if you have COVID19 please stay home for a full 10 days since the onset of symptoms PLUS one day of no fever and feeling better.  It was nice to meet you today, and I really hope you are feeling better soon. I help [facility] out with telemedicine visits on Tuesdays and Thursdays and am available for visits on those days. If you have any concerns or questions following this visit please schedule a follow up visit with your Primary Care doctor or seek care at a local urgent care clinic to avoid delays in care.    Seek in person care promptly if your symptoms worsen, new concerns arise or you  are not improving with treatment. Call 911 and/or seek emergency care if you symptoms are severe or life threatening.   

## 2020-09-23 NOTE — Addendum Note (Signed)
Addended by: [name] on: 09/23/2020 06:07 PM   Modules accepted: Level of Service

## 2020-09-23 NOTE — Progress Notes (Addendum)
Virtual Visit via Telephone Note  I connected with Shawn Wood on 09/23/20 at  6:00 PM EST by telephone and verified that I am speaking with the correct person using two identifiers.   I discussed the limitations, risks, security and privacy concerns of performing an evaluation and management service by telephone and the availability of in person appointments. I also discussed with the patient that there may be a patient responsible charge related to this service. The patient expressed understanding and agreed to proceed.  Location patient: home, [place] Location provider: work or home office Participants present for the call: patient, provider Patient did not have a visit with me in the prior 7 days to address this/these issue(s).   History of Present Illness:  Acute telemedicine visit for cough and congestion: -Onset: yesterday -Symptoms include: nasal congestion, cough, scratchy throat -Denies: fevers, sore throat, SOB, CP, NVD, body aches, inability -Has tried: -Pertinent past medical history: -Pertinent medication allergies: -COVID-19 vaccine status: fully vaccinated for covid + booster, had flu shot   Observations/Objective: Patient sounds cheerful and well on the phone. I do not appreciate any SOB. Speech and thought processing are grossly intact. Patient reported vitals:  Assessment and Plan:  Cough  Nasal congestion   -we discussed possible serious and likely etiologies, options for evaluation and workup, limitations of telemedicine visit vs in person visit, treatment, treatment risks and precautions. Pt prefers to treat via telemedicine empirically rather than in person at this moment.  Likely viral upper respiratory infection versus other.  He is fully vaccinated plus his booster for Covid and also has had his flu shot.  He was at the beach earlier this [place] for vacation.  He opted to treat symptomatically with nasal saline, analgesic if needed and sent Tessalon for cough.   Also discussed options for Covid testing and advised staying home while sick.  Further tips provided in his patient instructions. Work/School slipped offered:  declined Scheduled follow up with PCP offered: Agrees to follow-up if needed Advised to seek prompt in person care if worsening, new symptoms arise, or if is not improving with treatment. Discussed options for inperson care if PCP office not available. Did let this patient know that I only do telemedicine on Tuesdays and Thursdays for [place]. Advised to schedule follow up visit with PCP or [facility] if any further questions or concerns to avoid delays in care.  Follow Up Instructions:  I did not refer this patient for an OV with me in the next 24 hours for this/these issue(s).  I discussed the assessment and treatment plan with the patient. The patient was provided an opportunity to ask questions and all were answered. The patient agreed with the plan and demonstrated an understanding of the instructions.   I spent 15 minutes on this encounter.   Shawn Koyanagi, DO

## 2020-12-16 ENCOUNTER — Ambulatory Visit: Payer: PPO | Admitting: Family Medicine

## 2021-01-20 ENCOUNTER — Telehealth: Payer: Self-pay | Admitting: Family Medicine

## 2021-01-20 NOTE — Telephone Encounter (Signed)
Left message for patient to call back and schedule Medicare Annual Wellness Visit (AWV) either virtually OR in office.   No hx; please schedule at anytime with [facility]-Nurse Health Advisor at [facility].  This should be a 45 minute visit.   

## 2021-03-10 ENCOUNTER — Encounter: Payer: Self-pay | Admitting: Family

## 2021-03-10 ENCOUNTER — Ambulatory Visit (INDEPENDENT_AMBULATORY_CARE_PROVIDER_SITE_OTHER): Payer: PPO | Admitting: Family

## 2021-03-10 ENCOUNTER — Other Ambulatory Visit: Payer: Self-pay

## 2021-03-10 VITALS — BP 136/78 | HR 75 | Temp 98.6°F | Ht 68.0 in | Wt 258.8 lb

## 2021-03-10 DIAGNOSIS — H6123 Impacted cerumen, bilateral: Secondary | ICD-10-CM | POA: Diagnosis not present

## 2021-03-10 NOTE — Patient Instructions (Signed)

## 2021-03-10 NOTE — Progress Notes (Addendum)
Acute Office Visit  Subjective:    Patient ID: Shawn Wood, male    DOB: April 28, 1951, 70 y.o.   MRN: [id number]  Chief Complaint  Patient presents with  . Hearing Problem  . Ear Fullness    Bilateral ears    HPI Patient is in today with c/o ear fullness and decreased ability to hear over the last few days. His wife is a Engineer, civil (consulting) and attempted lavage without success. Patient also reports trying several home remedies without success. Has a history of cerumen impaction  Past Medical History:  Diagnosis Date  . COLONIC POLYPS, HX OF 05/25/2007  . HYPERGLYCEMIA 08/07/2008  . Leg pain right leg  . TINEA PEDIS 07/19/2007  . Varicose veins    history of cellulitis    Past Surgical History:  Procedure Laterality Date  . CATARACT EXTRACTION, BILATERAL     2020  . KNEE SURGERY  1999   fracture  . VARICOSE VEIN SURGERY      Family History  Problem Relation Age of Onset  . Breast cancer Mother   . Prostate cancer Father        late 11s  . Colon cancer Father        late 16s, did not have colonscopies  . Brain cancer Sister   . Seizures Son     Social History   Socioeconomic History  . Marital status: Married    Spouse name: Not on file  . Number of children: Not on file  . Years of education: Not on file  . Highest education level: Not on file  Occupational History  . Not on file  Tobacco Use  . Smoking status: Never Smoker  . Smokeless tobacco: Never Used  Vaping Use  . Vaping Use: Not on file  Substance and Sexual Activity  . Alcohol use: Yes    Comment: occcassinal beer  . Drug use: No  . Sexual activity: Yes  Other Topics Concern  . Not on file  Social History Narrative   Married 41 years (wife Charity fundraiser at [facility] [employer]) 2015 with 2 sons. 1 son with epilepsy stays at home and still has seizures. No grandkids.       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       Social Determinants of Corporate investment banker Strain: Not on [employer] Insecurity: Not on file  Transportation Needs: Not on file  Physical Activity: Not on file  Stress: Not on file  Social Connections: Not on file  Intimate Partner Violence: Not on file    Outpatient Medications Prior to Visit  Medication Sig Dispense Refill  . aspirin EC 81 MG tablet Take 81 mg by mouth daily.    . diclofenac (VOLTAREN) 75 MG EC tablet TAKE 1 TABLET BY MOUTH 2 TIMES DAILY 180 tablet 1  . PARoxetine (PAXIL) 40 MG tablet TAKE 1 TABLET BY MOUTH EVERY DAY 90 tablet 2  . benzonatate (TESSALON PERLES) 100 MG capsule Take 1 capsule (100 mg total) by mouth 3 (three) times daily as needed. (Patient not taking: Reported on 03/10/2021) 20 capsule 0   No facility-administered medications prior to visit.    No Known Allergies  Review of Systems  Constitutional: Negative.   HENT: Negative for mouth sores and postnasal drip.        Ear fullness and decreased hearing  Respiratory: Negative.   Cardiovascular: Negative.   Endocrine: Negative.   Psychiatric/Behavioral: Negative.   All other systems reviewed and are negative.      Objective:    Physical Exam HENT:     Head: Normocephalic.     Right Ear: There is impacted cerumen.     Left Ear: There is impacted cerumen.     Ears:     Comments: Informed consent was obtained. Ears lavaged bilaterally. Right ear successfully disimpacted. Left ear, unsuccessful. Patient tolerated the procedure well.  Cardiovascular:     Rate and Rhythm: Normal rate and regular rhythm.     Pulses: Normal pulses.     Heart sounds: Normal heart sounds.  Musculoskeletal:        General: Normal range of motion.     Cervical back: Normal range of motion and neck supple.  Skin:    General: Skin is warm and dry.  Neurological:      Mental Status: He is alert.  Psychiatric:        Mood and Affect: Mood normal.     BP 136/78   Pulse 75   Temp 98.6 F (37 C) (Temporal)   Ht 5\' 8"  (1.727 m)   Wt 258 lb 12.8 oz (117.4 kg)   SpO2 96%   BMI 39.35 kg/m  Wt Readings from Last 3 Encounters:  03/10/21 258 lb 12.8 oz (117.4 kg)  06/11/20 261 lb 6.4 oz (118.6 kg)  12/12/19 263 lb 12.8 oz (119.7 kg)          Assessment & Plan:   Problem List Items Addressed This Visit   None   Visit Diagnoses    Bilateral hearing loss due to cerumen impaction    -  Primary     Patient will instill debrox x 3 days and attempt ear lavage again. If unsuccessful, will refer to ENT for further management.  No orders of the defined types were placed in this encounter.    02/09/20, FNP

## 2021-04-28 ENCOUNTER — Telehealth: Payer: Self-pay

## 2021-04-28 MED ORDER — PAROXETINE HCL 30 MG PO TABS
30.0000 mg | ORAL_TABLET | Freq: Every day | ORAL | 3 refills | Status: DC
Start: 2021-04-28 — End: 2022-02-08

## 2021-04-28 NOTE — Telephone Encounter (Signed)
[facility] called and stated PARoxetine (PAXIL) 40 MG tablet  Was denied by the insurance for his age.

## 2021-04-28 NOTE — Telephone Encounter (Signed)
Was able to reach pt and made aware of the situation below, informed pt to call us if any issues.

## 2021-04-28 NOTE — Telephone Encounter (Signed)
FYI

## 2021-04-28 NOTE — Telephone Encounter (Signed)
Please inform patient-see if we can get 30 mg approved-send in Paxil 30 mg daily #90 with 3 refills-if that does not work we will have to trial 20 mg-hoping this will be effective for him still

## 2021-04-28 NOTE — Telephone Encounter (Signed)
Medication sent, called and lm for pt tcb.

## 2021-06-15 ENCOUNTER — Encounter: Payer: PPO | Admitting: Family Medicine

## 2021-06-29 ENCOUNTER — Other Ambulatory Visit: Payer: Self-pay | Admitting: Family Medicine

## 2021-12-09 NOTE — Progress Notes (Signed)
Phone: [phone]   Subjective:  Patient presents today for their annual physical. Chief complaint-noted.   See problem oriented charting- ROS- full  review of systems was completed and negative  except for: always been sweaty- no worsening, joint pain  The following were reviewed and entered/updated in epic: Past Medical History:  Diagnosis Date   COLONIC POLYPS, HX OF 05/25/2007   HYPERGLYCEMIA 08/07/2008   Leg pain right leg   TINEA PEDIS 07/19/2007   Varicose veins    history of cellulitis   Patient Active Problem List   Diagnosis Date Noted   Bilateral primary osteoarthritis of knee 02/01/2017    Priority: Medium    Atypical chest pain 11/07/2014    Priority: Medium    Hyperlipidemia, unspecified 06/04/2014    Priority: Medium    Depression 06/03/2014    Priority: Medium    Morbid obesity ([place]) 06/03/2014    Priority: Medium    Hyperglycemia 04/25/2013    Priority: Medium    CELLULITIS, LEG, LEFT 11/06/2009    Priority: Medium    Unilateral primary osteoarthritis, right knee 11/24/2016    Priority: Low   Hematuria 06/02/2014    Priority: Low   Traumatic compression fracture of thoracic vertebra ([place]) 03/04/2013    Priority: Low    Class: Acute   Fracture of multiple ribs 03/04/2013    Priority: Low    Class: Acute   Closed right scapular fracture 03/03/2013    Priority: Low    Class: Acute   COLONIC POLYPS, HX OF 05/25/2007    Priority: Low   Past Surgical History:  Procedure Laterality Date   CATARACT EXTRACTION, BILATERAL     2020   KNEE SURGERY  1999   fracture   VARICOSE VEIN SURGERY      Family History  Problem Relation Age of Onset   Breast cancer Mother    Prostate cancer Father        late 83s   Colon cancer Father        late 30s, did not have colonscopies   Brain cancer Sister    Seizures Son     Medications- reviewed and updated Current Outpatient Medications  Medication Sig Dispense Refill   aspirin EC 81 MG tablet Take 81  mg by mouth daily.     diclofenac (VOLTAREN) 75 MG EC tablet TAKE 1 TABLET BY MOUTH 2 TIMES DAILY 180 tablet 1   PARoxetine (PAXIL) 30 MG tablet Take 1 tablet (30 mg total) by mouth daily. 90 tablet 3   No current facility-administered medications for this visit.    Allergies-reviewed and updated No Known Allergies  Social History   Social History Narrative   Married 41 years (wife Therapist, sports at [facility]) 2015 with 2 sons. 1 son with epilepsy stays at home and still has seizures. No grandkids.       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       Objective  Objective:  BP 140/84    Pulse 68    Temp 98 F (36.7 C)    Ht 5\' 8"  (1.727 m)    Wt 257 lb 3.2 oz (116.7 kg)    SpO2 96%    BMI 39.11 kg/m  Gen: NAD, resting comfortably HEENT: Mucous membranes are moist. Oropharynx normal Neck: no thyromegaly CV: RRR no murmurs rubs or gallops Lungs: CTAB no crackles, wheeze, rhonchi Abdomen: soft/nontender/nondistended/normal bowel sounds. No rebound or guarding.  Ext: no edema Skin: warm, dry Neuro: grossly normal, moves all extremities, PERRLA   Assessment and Plan  71 y.o. male presenting for annual physical.  Health Maintenance counseling: 1. Anticipatory guidance: Patient counseled regarding regular dental exams -q6 months, eye exams -yearly,  avoiding smoking and second hand smoke , limiting alcohol to 2 beverages per day- doesn't drink.  No illicit drugs.  2. Risk factor reduction:  Advised patient of need for regular exercise and diet rich and fruits and vegetables to reduce risk of heart attack and stroke.  Exercise-walking still 2 blocks daily twice a day- now up to 30-40 mins.  Diet/weight management -  down 1 lb in last year and states  clothes fitting better- feels even more trim than the weight loss. 1-2 meals a day. Still enjoys his peanut butter thoroughly  Wt Readings from Last 3 Encounters:  12/15/21 257 lb 3.2 oz (116.7 kg)  03/10/21 258 lb 12.8 oz (117.4 kg)  06/11/20 261 lb 6.4 oz (118.6 kg)  3. Immunizations/screenings/ancillary studies DISCUSSED:  -Shingrix vaccination #1- declines for now Immunization History  Administered Date(s) Administered   Fluad Quad(high Dose 65+) 08/01/2019, 09/03/2020, 08/11/2021   Influenza Split 08/16/2011, 08/22/2012   Influenza Whole 11/01/2001, 08/16/2007, 08/07/2008, 08/13/2009, 07/23/2010   Influenza, High Dose Seasonal PF 07/28/2015, 09/14/2017, 07/24/2018   Influenza,inj,Quad PF,6+ Mos 08/15/2013, 08/14/2014, 08/01/2019   Influenza-Unspecified 08/02/2016   Moderna Sars-Covid-2 Vaccination 12/15/2019, 01/12/2020   Pfizer Covid-19 Vaccine Bivalent Booster 24yrs & up 08/11/2021   Pneumococcal Conjugate-13 11/28/2015   Pneumococcal Polysaccharide-23 06/03/2014   Tdap 08/07/2008, 03/01/2011, 12/10/2012   Zoster, Live 03/01/2011  4. Prostate cancer screening-  low risk prior PSA trend-over age 41 he opts out Lab Results  Component Value Date   PSA 0.3 06/11/2020   PSA 0.48 06/11/2019   PSA 0.67 03/10/2018   5. Colon cancer screening - 03/31/2010 with 5 year repeat planned (family history of colon cancer in dad as well as personal polyps 2006 I believe)-  Patient does not want to proceed forward due to pandemic previously- then his wife had a bad reaction to anesthesia with colonoscopy . Discussed cologuard but could provide false sense of relief with polyp history and family history- but he will not do colonoscopy at this point and really wants to do cologuard. If positive agrees to colonoscopy.  6. Skin cancer screening- follows with dermatology- last visit a few years ago. advised regular sunscreen use. Denies worrisome, changing, or new skin lesions.  7. Smoking associated  screening (lung cancer screening, AAA screen 65-75, UA)- Never smoker 8. STD screening -declines as married/monogamous   Status of chronic or acute concerns   #Social update-continued to care for son with epilepsy- The brain stimulator had not been effective.  Wife finally retired but had a bad fall in June 2022 with fractures- psychosis after anesthesia/fentanyl- was very rough 10-12 days- wife doesn't even remember it all.       #Blood pressure elevation-mild elevations in the office today at 140/84-goal at home is less than 135/85-I want him to monitor his home blood pressure daily for the next 2 weeks-can get a simple home cuff such as the Omron series 3 or have his wife check on the and send me the results or drop off a sheet of  paper with his name and DOB- if remains high may need to either stop diclofenac OR start low dose blood pressure meds because he is already doing a good job on slow gradual weight loss. Dash eating plan may helps as well  # Depression S: Medication:Paxil 30 mg daily (was 40 mg in the past)  Depression screen [facility] 2/9 12/15/2021 06/11/2020 12/12/2019  Decreased Interest 0 0 0  Down, Depressed, Hopeless 0 0 0  PHQ - 2 Score 0 0 0  Altered sleeping 0 0 0  Tired, decreased energy 0 0 0  Change in appetite 0 0 0  Feeling bad or failure about yourself  0 0 0  Trouble concentrating 0 0 0  Moving slowly or fidgety/restless 0 0 0  Suicidal thoughts 0 0 0  PHQ-9 Score 0 0 0  Difficult doing work/chores Not difficult at all Not difficult at all Not difficult at all  A/P: reasonable control despite stressors- continue current meds    #hyperlipidemia S: Medication: none - he does prefer to take asa for pirimary prevention ordered atorvastatin 20 mg once weekly in the past-patient never picked this up Lab Results  Component Value Date   CHOL 185 06/11/2020   HDL 48 06/11/2020   LDLCALC 113 (H) 06/11/2020   TRIG 129 06/11/2020   CHOLHDL 3.9 06/11/2020  A/P: wants to  update lipids- not sure if he wants to do ct cardiac scoring- riisk is high enough unless there has been a big change that id recommend statin or at minimum getting more info   #Knee osteoarthritis S: diclofenac daily gave enough relief but we needed to watch kidney function closely-also warned of GI bleeding risk with this and Paxil. Have encouraged minimized use- using once a day. Still able to walk -has seen Dr. Lorin Mercy- injections but only helped short-term as hyaluronic acid was not helpful with orthopedics and was told eventually need replacement- he continued to decline -no recent visitis A/P: overall stable- recommended q6 months to monitor kidney functoin    # Hyperglycemia/insulin resistance/prediabetes- a1c as high as 6.3  S:  Medication: none Exercise and diet- walking- slight weight loss Lab Results  Component Value Date   HGBA1C 6.2 (H) 06/11/2020  A/P: hopefully stable or improved congratulated on weight loss  #morbid obesity with BMI over 40 with HLD and hyperglycemia/prediabtes  Recommended follow up: No follow-ups on file.  Lab/Order associations: fasting   ICD-10-CM   1. Preventative health care  Z00.00     2. Hyperlipidemia, unspecified hyperlipidemia type  E78.5     3. Hyperglycemia  R73.9     4. Bilateral primary osteoarthritis of knee  M17.0     5. Recurrent major depressive disorder, in full remission ([place])  F33.42     6. Screening for colon cancer  Z12.11       No orders of the defined types were placed in this encounter.   I,Jada Bradford,acting as a scribe for Garret Reddish, MD.,have documented all relevant documentation on the behalf of Garret Reddish, MD,as directed by  Garret Reddish, MD while in the presence of Garret Reddish, MD.  I, Garret Reddish, MD, have reviewed all documentation for this visit. The documentation on 12/15/21 for the exam, diagnosis, procedures, and orders are all accurate and complete.  Return precautions advised.   Garret Reddish, MD

## 2021-12-09 NOTE — Patient Instructions (Addendum)
Call us if you do not receive cologuard within 3 weeks  #Blood pressure elevation-mild elevations in the office today at 140/84-goal at home is less than 135/85-I want him to monitor his home blood pressure daily for the next 2 weeks-can get a simple home cuff such as the Omron series 3 or have his wife check on the and send me the results or drop off a sheet of paper with his name and DOB- if remains high may need to either stop diclofenac OR start low dose blood pressure meds because he is already doing a good job on slow gradual weight loss. Dash eating plan may helps as well  Please stop by lab before you go If you have mychart- we will send your results within 3 business days of [place] receiving them.  If you do not have mychart- we will call you about results within 5 business days of [place] receiving them.  *please also note that you will see labs on mychart as soon as they post. I will later go in and write notes on them- will say "notes from Dr. Durene Cal"   Recommended follow up: Return in about 6 months (around 06/14/2022) for follow up- or sooner if needed.

## 2021-12-15 ENCOUNTER — Ambulatory Visit (INDEPENDENT_AMBULATORY_CARE_PROVIDER_SITE_OTHER): Payer: PPO | Admitting: Family Medicine

## 2021-12-15 ENCOUNTER — Other Ambulatory Visit: Payer: Self-pay

## 2021-12-15 ENCOUNTER — Encounter: Payer: Self-pay | Admitting: Family Medicine

## 2021-12-15 VITALS — BP 140/84 | HR 68 | Temp 98.0°F | Ht 68.0 in | Wt 257.2 lb

## 2021-12-15 DIAGNOSIS — F3342 Major depressive disorder, recurrent, in full remission: Secondary | ICD-10-CM

## 2021-12-15 DIAGNOSIS — E785 Hyperlipidemia, unspecified: Secondary | ICD-10-CM

## 2021-12-15 DIAGNOSIS — R739 Hyperglycemia, unspecified: Secondary | ICD-10-CM | POA: Diagnosis not present

## 2021-12-15 DIAGNOSIS — Z0000 Encounter for general adult medical examination without abnormal findings: Secondary | ICD-10-CM

## 2021-12-15 DIAGNOSIS — M170 Bilateral primary osteoarthritis of knee: Secondary | ICD-10-CM | POA: Diagnosis not present

## 2021-12-15 DIAGNOSIS — Z1211 Encounter for screening for malignant neoplasm of colon: Secondary | ICD-10-CM

## 2021-12-15 LAB — CBC WITH DIFFERENTIAL/PLATELET
Basophils Absolute: 0 10*3/uL (ref 0.0–0.1)
Basophils Relative: 0.9 % (ref 0.0–3.0)
Eosinophils Absolute: 0.2 10*3/uL (ref 0.0–0.7)
Eosinophils Relative: 5 % (ref 0.0–5.0)
HCT: 44.4 % (ref 39.0–52.0)
Hemoglobin: 14.8 g/dL (ref 13.0–17.0)
Lymphocytes Relative: 25.2 % (ref 12.0–46.0)
Lymphs Abs: 1.2 10*3/uL (ref 0.7–4.0)
MCHC: 33.4 g/dL (ref 30.0–36.0)
MCV: 89.6 fl (ref 78.0–100.0)
Monocytes Absolute: 0.4 10*3/uL (ref 0.1–1.0)
Monocytes Relative: 8.1 % (ref 3.0–12.0)
Neutro Abs: 3 10*3/uL (ref 1.4–7.7)
Neutrophils Relative %: 60.8 % (ref 43.0–77.0)
Platelets: 248 10*3/uL (ref 150.0–400.0)
RBC: 4.95 Mil/uL (ref 4.22–5.81)
RDW: 13.4 % (ref 11.5–15.5)
WBC: 4.9 10*3/uL (ref 4.0–10.5)

## 2021-12-15 LAB — HEMOGLOBIN A1C: Hgb A1c MFr Bld: 6.3 % (ref 4.6–6.5)

## 2021-12-15 LAB — TSH: TSH: 1.89 u[IU]/mL (ref 0.35–5.50)

## 2021-12-16 LAB — COMPREHENSIVE METABOLIC PANEL
ALT: 14 U/L (ref 0–53)
AST: 17 U/L (ref 0–37)
Albumin: 4.5 g/dL (ref 3.5–5.2)
Alkaline Phosphatase: 66 U/L (ref 39–117)
BUN: 18 mg/dL (ref 6–23)
CO2: 24 mEq/L (ref 19–32)
Calcium: 9.4 mg/dL (ref 8.4–10.5)
Chloride: 103 mEq/L (ref 96–112)
Creatinine, Ser: 1.01 mg/dL (ref 0.40–1.50)
GFR: 74.21 mL/min (ref >60.00)
Glucose, Bld: 122 mg/dL — ABNORMAL HIGH (ref 70–99)
Potassium: 4.6 mEq/L (ref 3.5–5.1)
Sodium: 137 mEq/L (ref 135–145)
Total Bilirubin: 0.7 mg/dL (ref 0.2–1.2)
Total Protein: 7 g/dL (ref 6.0–8.3)

## 2021-12-16 LAB — LIPID PANEL
Cholesterol: 205 mg/dL — ABNORMAL HIGH (ref 0–200)
HDL: 55.9 mg/dL (ref >39.00)
LDL Cholesterol: 132 mg/dL — ABNORMAL HIGH (ref 0–99)
NonHDL: 148.97
Total CHOL/HDL Ratio: 4
Triglycerides: 85 mg/dL (ref 0.0–149.0)
VLDL: 17 mg/dL (ref 0.0–40.0)

## 2021-12-21 ENCOUNTER — Other Ambulatory Visit: Payer: Self-pay

## 2021-12-21 DIAGNOSIS — M1990 Unspecified osteoarthritis, unspecified site: Secondary | ICD-10-CM | POA: Diagnosis not present

## 2021-12-21 MED ORDER — ATORVASTATIN CALCIUM 20 MG PO TABS: 20.0000 mg | ORAL_TABLET | ORAL | 3 refills | Status: DC

## 2021-12-25 DIAGNOSIS — I10 Essential (primary) hypertension: Secondary | ICD-10-CM | POA: Diagnosis not present

## 2021-12-25 DIAGNOSIS — M1990 Unspecified osteoarthritis, unspecified site: Secondary | ICD-10-CM | POA: Diagnosis not present

## 2021-12-28 DIAGNOSIS — Z1211 Encounter for screening for malignant neoplasm of colon: Secondary | ICD-10-CM | POA: Diagnosis not present

## 2021-12-28 LAB — COLOGUARD: Cologuard: NEGATIVE

## 2021-12-30 ENCOUNTER — Other Ambulatory Visit: Payer: Self-pay | Admitting: Family Medicine

## 2022-01-04 LAB — COLOGUARD: COLOGUARD: NEGATIVE

## 2022-01-22 ENCOUNTER — Telehealth: Payer: Self-pay

## 2022-01-22 NOTE — Telephone Encounter (Signed)
Pt dropped off BP readings: ? ?02-23: 132/83 ?02-24: 153/81, 148/82 ?02-25: 152/80, 142/80 ?02-26: 155/84, 144/87 ?63-84:536/46, 123/76 ?02-28:127/81, 133/82 ?03-01: 135/84, 128/82 ?03-02: 130/82, 137/88 ?03-03: 140/84 ?03-05:136/88 ?80-32:122/48 ?03-07: 137/78 ?03-08: 134/86 ?03-09: 141/77 ? ?

## 2022-01-22 NOTE — Telephone Encounter (Signed)
Blood pressure above goal. Please start him on amlodipine 2.5 mg daily #90 with 3 refills- want more readings under 135/85. Have him schedule a visit in next 1-3 months to follow up on blood pressure after starting meds.  ?

## 2022-01-23 DIAGNOSIS — M1990 Unspecified osteoarthritis, unspecified site: Secondary | ICD-10-CM | POA: Diagnosis not present

## 2022-01-25 MED ORDER — AMLODIPINE BESYLATE 2.5 MG PO TABS: 2.5000 mg | ORAL_TABLET | Freq: Every day | ORAL | 3 refills | Status: DC

## 2022-01-25 NOTE — Addendum Note (Signed)
Addended by: Lieutenant Diego A on: 01/25/2022 10:08 AM ? ? Modules accepted: Orders ? ?

## 2022-01-25 NOTE — Telephone Encounter (Signed)
Called and lm for pt tcb, medication sent in. ?

## 2022-01-25 NOTE — Telephone Encounter (Signed)
Shawn Wood called back - understood message from provider. Picked up meds for patient - Has sch fu for 03/22/22 at 1020am - pt will call back should he have any medical concerns or questions regarding this issue.  ?

## 2022-02-06 ENCOUNTER — Other Ambulatory Visit: Payer: Self-pay | Admitting: Family Medicine

## 2022-02-11 DIAGNOSIS — M1990 Unspecified osteoarthritis, unspecified site: Secondary | ICD-10-CM | POA: Diagnosis not present

## 2022-02-11 DIAGNOSIS — Z7982 Long term (current) use of aspirin: Secondary | ICD-10-CM | POA: Diagnosis not present

## 2022-02-11 DIAGNOSIS — F3342 Major depressive disorder, recurrent, in full remission: Secondary | ICD-10-CM | POA: Diagnosis not present

## 2022-02-11 DIAGNOSIS — E669 Obesity, unspecified: Secondary | ICD-10-CM | POA: Diagnosis not present

## 2022-02-11 DIAGNOSIS — E785 Hyperlipidemia, unspecified: Secondary | ICD-10-CM | POA: Diagnosis not present

## 2022-02-11 DIAGNOSIS — G8929 Other chronic pain: Secondary | ICD-10-CM | POA: Diagnosis not present

## 2022-02-22 ENCOUNTER — Ambulatory Visit (INDEPENDENT_AMBULATORY_CARE_PROVIDER_SITE_OTHER): Payer: PPO | Admitting: Family Medicine

## 2022-02-22 ENCOUNTER — Encounter: Payer: Self-pay | Admitting: Family Medicine

## 2022-02-22 VITALS — BP 146/78 | HR 78 | Temp 98.0°F | Ht 68.0 in | Wt 256.4 lb

## 2022-02-22 DIAGNOSIS — R21 Rash and other nonspecific skin eruption: Secondary | ICD-10-CM | POA: Diagnosis not present

## 2022-02-22 DIAGNOSIS — I10 Essential (primary) hypertension: Secondary | ICD-10-CM | POA: Diagnosis not present

## 2022-02-22 DIAGNOSIS — M1990 Unspecified osteoarthritis, unspecified site: Secondary | ICD-10-CM | POA: Diagnosis not present

## 2022-02-22 MED ORDER — DOXYCYCLINE HYCLATE 100 MG PO TABS: 100.0000 mg | ORAL_TABLET | Freq: Two times a day (BID) | ORAL | 0 refills | Status: DC

## 2022-02-22 NOTE — Patient Instructions (Signed)
It was very nice to see you today! ? ?Please start the doxycycline.  You can use a little cortisone cream on the area as well.  Let me know or let Dr. Durene Cal know if its not improving in the next few days. ? ?Take care, ?Dr Jimmey Ralph ? ?PLEASE NOTE: ? ?If you had any lab tests please let us know if you have not heard back within a few days. You may see your results on mychart before we have a chance to review them but we will give you a call once they are reviewed by [place]. If we ordered any referrals today, please let us know if you have not heard from their office within the next week.  ? ?Please try these tips to maintain a healthy lifestyle: ? ?Eat at least 3 REAL meals and 1-2 snacks per day.  Aim for no more than 5 hours between eating.  If you eat breakfast, please do so within one hour of getting up.  ? ?Each meal should contain half fruits/vegetables, one quarter protein, and one quarter carbs (no bigger than a computer mouse) ? ?Cut down on sweet beverages. This includes juice, soda, and sweet tea.  ? ?Drink at least 1 glass of water with each meal and aim for at least 8 glasses per day ? ?Exercise at least 150 minutes every week.   ?

## 2022-02-22 NOTE — Progress Notes (Signed)
? ?  Shawn Wood is a 71 y.o. male who presents today for an office visit. ? ?Assessment/Plan:  ?Rash ?No signs of systemic illness.  Could be underlying cellulitis though may have component of contact dermatitis as well.  Given his history of sepsis due to prior episodes of cellulitis we will start course of doxycycline.  Also recommended topical cortisone cream to the area. They can stop using topical adhesives and Neosporin as this could be exacerbating the rash.  We discussed reasons to return to care and seek emergent care.  Follow-up as needed. ? ? ?  ?Subjective:  ?HPI: ? ?Patient here with rash on right wrist.  Started about a week ago.  He was putting me blinds when he scratched his wrist.  Had some bleeding.  He has been putting Neosporin and Band-Aid to the area however has noticed increased redness.  He has had quite a bit of itching.  No pain.  No fevers or chills.  He has been hospitalized for cellulitis in the past and would like to make sure this is not happening again.  The area of redness is spreading. ? ?   ?  ?Objective:  ?Physical Exam: ?BP (!) 146/78   Pulse 78   Temp 98 ?F (36.7 ?C) (Temporal)   Ht 5\' 8"  (1.727 m)   Wt 256 lb 6.4 oz (116.3 kg)   SpO2 97%   BMI 38.99 kg/m?   ?Gen: No acute distress, resting comfortably ?Skin: Approximately 4 x 6 cm erythematous area on right volar wrist with central excoriation present. ?Neuro: Grossly normal, moves all extremities ?Psych: Normal affect and thought content ? ?   ? ? . Katina Degree, MD ?02/22/2022 3:34 PM  ?

## 2022-02-28 ENCOUNTER — Encounter: Payer: Self-pay | Admitting: Family Medicine

## 2022-03-01 NOTE — Telephone Encounter (Signed)
Pt has an appt scheduled with PCP Dr Yong Channel tomorrow ?

## 2022-03-02 ENCOUNTER — Encounter: Payer: Self-pay | Admitting: Family Medicine

## 2022-03-02 ENCOUNTER — Ambulatory Visit (INDEPENDENT_AMBULATORY_CARE_PROVIDER_SITE_OTHER): Payer: PPO | Admitting: Family Medicine

## 2022-03-02 VITALS — BP 122/70 | HR 69 | Temp 97.8°F | Ht 68.0 in | Wt 257.2 lb

## 2022-03-02 DIAGNOSIS — R739 Hyperglycemia, unspecified: Secondary | ICD-10-CM

## 2022-03-02 DIAGNOSIS — I10 Essential (primary) hypertension: Secondary | ICD-10-CM

## 2022-03-02 DIAGNOSIS — R21 Rash and other nonspecific skin eruption: Secondary | ICD-10-CM

## 2022-03-02 DIAGNOSIS — L03113 Cellulitis of right upper limb: Secondary | ICD-10-CM | POA: Diagnosis not present

## 2022-03-02 DIAGNOSIS — R7303 Prediabetes: Secondary | ICD-10-CM

## 2022-03-02 MED ORDER — CEPHALEXIN 500 MG PO CAPS: 500.0000 mg | ORAL_CAPSULE | Freq: Three times a day (TID) | ORAL | 0 refills | Status: AC

## 2022-03-02 MED ORDER — PREDNISONE 20 MG PO TABS: ORAL_TABLET | ORAL | 0 refills | Status: DC

## 2022-03-02 MED ORDER — TRIAMCINOLONE ACETONIDE 0.1 % EX CREA: 1.0000 "application " | TOPICAL_CREAM | Freq: Two times a day (BID) | CUTANEOUS | 0 refills | Status: DC

## 2022-03-02 NOTE — Patient Instructions (Addendum)
Start keflex today as well as stronger steroid cream triamcinolone (do not use on open portion) ? ?After 24 to 48 hours if no improvement can try prednisone ? ?If worsening I need to know ? ?HAVE to stop scratching the area- this is key to healing ? ?Recommended follow up: Return for as needed for new, worsening, persistent symptoms. ?

## 2022-03-02 NOTE — Progress Notes (Signed)
?Phone [phone] ?In person visit ?  ?Subjective:  ? ?Shawn Wood is a 71 y.o. year old very pleasant male patient who presents for/with See problem oriented charting ?Chief Complaint  ?Patient presents with  ? Follow-up  ?  Pt states infection in wrist is not improving, he has finished the doxycycline.  ? ?Past Medical History-  ?Patient Active Problem List  ? Diagnosis Date Noted  ? Essential hypertension 03/02/2022  ?  Priority: Medium   ? Bilateral primary osteoarthritis of knee 02/01/2017  ?  Priority: Medium   ? Atypical chest pain 11/07/2014  ?  Priority: Medium   ? Hyperlipidemia, unspecified 06/04/2014  ?  Priority: Medium   ? Depression 06/03/2014  ?  Priority: Medium   ? Morbid obesity ([place]) 06/03/2014  ?  Priority: Medium   ? Hyperglycemia 04/25/2013  ?  Priority: Medium   ? CELLULITIS, LEG, LEFT 11/06/2009  ?  Priority: Medium   ? Unilateral primary osteoarthritis, right knee 11/24/2016  ?  Priority: Low  ? Hematuria 06/02/2014  ?  Priority: Low  ? Traumatic compression fracture of thoracic vertebra ([place]) 03/04/2013  ?  Priority: Low  ?  Class: Acute  ? Fracture of multiple ribs 03/04/2013  ?  Priority: Low  ?  Class: Acute  ? Closed right scapular fracture 03/03/2013  ?  Priority: Low  ?  Class: Acute  ? COLONIC POLYPS, HX OF 05/25/2007  ?  Priority: Low  ? ? ?Medications- reviewed and updated ?Current Outpatient Medications  ?Medication Sig Dispense Refill  ? aspirin EC 81 MG tablet Take 81 mg by mouth daily.    ? atorvastatin (LIPITOR) 20 MG tablet Take 1 tablet (20 mg total) by mouth once a week. 13 tablet 3  ? cephALEXin (KEFLEX) 500 MG capsule Take 1 capsule (500 mg total) by mouth 3 (three) times daily for 7 days. 21 capsule 0  ? diclofenac (VOLTAREN) 75 MG EC tablet TAKE 1 TABLET BY MOUTH 2 TIMES DAILY 180 tablet 1  ? PARoxetine (PAXIL) 30 MG tablet TAKE 1 TABLET BY MOUTH EVERY DAY 90 tablet 3  ? predniSONE (DELTASONE) 20 MG tablet Take 2 pills for 3 days, 1 pill for 4 days 10 tablet 0  ?  triamcinolone cream (KENALOG) 0.1 % Apply 1 application. topically 2 (two) times daily. For 7-10 days maximum. NOT to open area 80 g 0  ? ?No current facility-administered medications for this visit.  ? ?  ?Objective:  ?BP 122/70   Pulse 69   Temp 97.8 ?F (36.6 ?C)   Ht 5\' 8"  (1.727 m)   Wt 257 lb 3.2 oz (116.7 kg)   SpO2 95%   BMI 39.11 kg/m?  ?Gen: NAD, resting comfortably ?CV: RRR no murmurs rubs or gallops ?Lungs: CTAB no crackles, wheeze, rhonchi ?Abdomen: soft/nontender/nondistended/normal bowel sounds. No rebound or guarding.  ?Ext: no edema ?Skin: warm, dry, 8x15 cm erythematous area on right inner forearm- enlarged from 4x6 cm previously. Some areas of cracking across lower portion towards wrist.  Area is not particularly tender or warm to touch ? ? ? ?  ? ?Assessment and Plan  ? ?#Rash/concern for cellulitis ?S: Patient was seen by Dr. Jerline Pain on 02/22/2022 for a rash on his right wrist which had started a week prior to visit.  He had scratched his wrist while putting up blinds.  Neosporin and Band-Aid were not helpful and noted progressive redness and a fair amount of itchiness.  Did not note pain, fever, chills.  He was concerned about cellulitis ? ?Dr. Jerline Pain was worried about contact dermatitis as well as possible underlying cellulitis-with history of sepsis due to prior episode of cellulitis he was started on doxycycline and topical cortisone cream and advised to avoid topical adhesives and Neosporin. He also reports had been out pulling weed and could have gotten into some poison ivy.  ? ?Today patient reports no improvement and actually seems to be spreading. He reports no fever or chills. Mild pain at original wound site ?ROS-not ill appearing, no fever/chills. No new medications. Not immunocompromised. No mucus membrane involvement.  ?A/P: I am most concerned for contact dermatitis with continued excoriation but with patient's history of sepsis related to cellulitis and worsening redness we  discussed course of Keflex (previously treated with doxycycline and did not improve) ?- We will trial triamcinolone outside of the wound but to exclude open wound ?- If continues to itch and unable to stop scratching I sent in prednisone that he can trial ? ?#hypertension ?S: medication: amlodipine 2.5 mg was prescribed after patient dropped off multiple blood pressure readings at home that were elevated above 140/90 in addition to 2 prior readings here which were elevated. ?-Per notes medication was picked up by wife but wife is with him today and states she did not pick this up and he is not taking this.  They have tried to improve his diet and has noted some improvement in blood pressure since that time ?Home readings #s: 120s over 70s or 80s for the most part ?BP Readings from Last 3 Encounters:  ?03/02/22 122/70  ?02/22/22 (!) 146/78  ?12/15/21 140/84  ?A/P: Blood pressure appears to have improved with dietary changes at home and patient is not taking amlodipine-we will remove from medication list ? ? ? # Hyperglycemia/insulin resistance/prediabetes ?S:  Medication: None ?Lab Results  ?Component Value Date  ? HGBA1C 6.3 12/15/2021  ? HGBA1C 6.2 (H) 06/11/2020  ? HGBA1C 6.3 12/12/2019  ? ? A/P: Prediabetes has been largely stable-too soon for repeat-focus here should be on healthy eating/regular exercise-we did discuss prednisone may short-term increase blood sugars but if next labs are outside of 3 months from prednisone use should not affect A1c significantly ? ?Recommended follow up: Return for as needed for new, worsening, persistent symptoms. ? ? ?Lab/Order associations: ?  ICD-10-CM   ?1. Rash  R21   ?  ?2. Cellulitis of right forearm  L03.113   ?  ?3. Hyperglycemia  R73.9   ?  ?4. Essential hypertension  I10   ?  ?5. Prediabetes  R73.03   ?  ? ? ?Meds ordered this encounter  ?Medications  ? cephALEXin (KEFLEX) 500 MG capsule  ?  Sig: Take 1 capsule (500 mg total) by mouth 3 (three) times daily for 7 days.   ?  Dispense:  21 capsule  ?  Refill:  0  ? triamcinolone cream (KENALOG) 0.1 %  ?  Sig: Apply 1 application. topically 2 (two) times daily. For 7-10 days maximum. NOT to open area  ?  Dispense:  80 g  ?  Refill:  0  ? predniSONE (DELTASONE) 20 MG tablet  ?  Sig: Take 2 pills for 3 days, 1 pill for 4 days  ?  Dispense:  10 tablet  ?  Refill:  0  ? ? ?Return precautions advised.  ?Garret Reddish, MD ? ?

## 2022-03-03 ENCOUNTER — Encounter: Payer: Self-pay | Admitting: Family Medicine

## 2022-03-22 ENCOUNTER — Ambulatory Visit: Payer: PPO | Admitting: Family Medicine

## 2022-04-30 DIAGNOSIS — I10 Essential (primary) hypertension: Secondary | ICD-10-CM | POA: Diagnosis not present

## 2022-04-30 DIAGNOSIS — M1990 Unspecified osteoarthritis, unspecified site: Secondary | ICD-10-CM | POA: Diagnosis not present

## 2022-05-31 DIAGNOSIS — I10 Essential (primary) hypertension: Secondary | ICD-10-CM | POA: Diagnosis not present

## 2022-05-31 DIAGNOSIS — M1990 Unspecified osteoarthritis, unspecified site: Secondary | ICD-10-CM | POA: Diagnosis not present

## 2022-06-22 ENCOUNTER — Ambulatory Visit: Payer: PPO | Admitting: Family Medicine

## 2022-07-01 DIAGNOSIS — I10 Essential (primary) hypertension: Secondary | ICD-10-CM | POA: Diagnosis not present

## 2022-07-26 ENCOUNTER — Encounter: Payer: Self-pay | Admitting: *Deleted

## 2022-07-31 DIAGNOSIS — I10 Essential (primary) hypertension: Secondary | ICD-10-CM | POA: Diagnosis not present

## 2022-08-26 ENCOUNTER — Ambulatory Visit (INDEPENDENT_AMBULATORY_CARE_PROVIDER_SITE_OTHER): Payer: PPO

## 2022-08-26 VITALS — Wt 257.0 lb

## 2022-08-26 DIAGNOSIS — Z0000 Encounter for general adult medical examination without abnormal findings: Secondary | ICD-10-CM | POA: Diagnosis not present

## 2022-08-26 NOTE — Patient Instructions (Signed)
Shawn Wood , Thank you for taking time to come for your Medicare Wellness Visit. I appreciate your ongoing commitment to your health goals. Please review the following plan we discussed and let me know if I can assist you in the future.   These are the goals we discussed:  Goals      Patient Stated     Lose more weight         This is a list of the screening recommended for you and due dates:  Health Maintenance  Topic Date Due   Colon Cancer Screening  04/01/2015   COVID-19 Vaccine (5 - Mixed Product risk series) 09/30/2022   Tetanus Vaccine  12/10/2022   Medicare Annual Wellness Visit  09/26/2023   Pneumonia Vaccine  Completed   Flu Shot  Completed   Hepatitis C Screening: USPSTF Recommendation to screen - Ages 18-79 yo.  Completed   Zoster (Shingles) Vaccine  Completed   HPV Vaccine  Aged Out    Advanced directives: Please bring a copy of your health care power of attorney and living will to the office at your convenience.  Conditions/risks identified: lose more weight   Next appointment: Follow up in one year for your annual wellness visit.   Preventive Care 24 Years and Older, Male  Preventive care refers to lifestyle choices and visits with your health care provider that can promote health and wellness. What does preventive care include? A yearly physical exam. This is also called an annual well check. Dental exams once or twice a year. Routine eye exams. Ask your health care provider how often you should have your eyes checked. Personal lifestyle choices, including: Daily care of your teeth and gums. Regular physical activity. Eating a healthy diet. Avoiding tobacco and drug use. Limiting alcohol use. Practicing safe sex. Taking low doses of aspirin every day. Taking vitamin and mineral supplements as recommended by your health care provider. What happens during an annual well check? The services and screenings done by your health care provider during your  annual well check will depend on your age, overall health, lifestyle risk factors, and family history of disease. Counseling  Your health care provider may ask you questions about your: Alcohol use. Tobacco use. Drug use. Emotional well-being. Home and relationship well-being. Sexual activity. Eating habits. History of falls. Memory and ability to understand (cognition). Work and work Statistician. Screening  You may have the following tests or measurements: Height, weight, and BMI. Blood pressure. Lipid and cholesterol levels. These may be checked every 5 years, or more frequently if you are over 26 years old. Skin check. Lung cancer screening. You may have this screening every year starting at age 93 if you have a 30-pack-year history of smoking and currently smoke or have quit within the past 15 years. Fecal occult blood test (FOBT) of the stool. You may have this test every year starting at age 54. Flexible sigmoidoscopy or colonoscopy. You may have a sigmoidoscopy every 5 years or a colonoscopy every 10 years starting at age 26. Prostate cancer screening. Recommendations will vary depending on your family history and other risks. Hepatitis C blood test. Hepatitis B blood test. Sexually transmitted disease (STD) testing. Diabetes screening. This is done by checking your blood sugar (glucose) after you have not eaten for a while (fasting). You may have this done every 1-3 years. Abdominal aortic aneurysm (AAA) screening. You may need this if you are a current or former smoker. Osteoporosis. You may be screened starting  at age 32 if you are at high risk. Talk with your health care provider about your test results, treatment options, and if necessary, the need for more tests. Vaccines  Your health care provider may recommend certain vaccines, such as: Influenza vaccine. This is recommended every year. Tetanus, diphtheria, and acellular pertussis (Tdap, Td) vaccine. You may need a Td  booster every 10 years. Zoster vaccine. You may need this after age 22. Pneumococcal 13-valent conjugate (PCV13) vaccine. One dose is recommended after age 69. Pneumococcal polysaccharide (PPSV23) vaccine. One dose is recommended after age 32. Talk to your health care provider about which screenings and vaccines you need and how often you need them. This information is not intended to replace advice given to you by your health care provider. Make sure you discuss any questions you have with your health care provider. Document Released: 11/14/2015 Document Revised: 07/07/2016 Document Reviewed: 08/19/2015 Elsevier Interactive Patient Education  2017 [place] Prevention in the Home Falls can cause injuries. They can happen to people of all ages. There are many things you can do to make your home safe and to help prevent falls. What can I do on the outside of my home? Regularly fix the edges of walkways and driveways and fix any cracks. Remove anything that might make you trip as you walk through a door, such as a raised step or threshold. Trim any bushes or trees on the path to your home. Use bright outdoor lighting. Clear any walking paths of anything that might make someone trip, such as rocks or tools. Regularly check to see if handrails are loose or broken. Make sure that both sides of any steps have handrails. Any raised decks and porches should have guardrails on the edges. Have any leaves, snow, or ice cleared regularly. Use sand or salt on walking paths during winter. Clean up any spills in your garage right away. This includes oil or grease spills. What can I do in the bathroom? Use night lights. Install grab bars by the toilet and in the tub and shower. Do not use towel bars as grab bars. Use non-skid mats or decals in the tub or shower. If you need to sit down in the shower, use a plastic, non-slip stool. Keep the floor dry. Clean up any water that spills on the floor  as soon as it happens. Remove soap buildup in the tub or shower regularly. Attach bath mats securely with double-sided non-slip rug tape. Do not have throw rugs and other things on the floor that can make you trip. What can I do in the bedroom? Use night lights. Make sure that you have a light by your bed that is easy to reach. Do not use any sheets or blankets that are too big for your bed. They should not hang down onto the floor. Have a firm chair that has side arms. You can use this for support while you get dressed. Do not have throw rugs and other things on the floor that can make you trip. What can I do in the kitchen? Clean up any spills right away. Avoid walking on wet floors. Keep items that you use a lot in easy-to-reach places. If you need to reach something above you, use a strong step stool that has a grab bar. Keep electrical cords out of the way. Do not use floor polish or wax that makes floors slippery. If you must use wax, use non-skid floor wax. Do not have throw rugs  and other things on the floor that can make you trip. What can I do with my stairs? Do not leave any items on the stairs. Make sure that there are handrails on both sides of the stairs and use them. Fix handrails that are broken or loose. Make sure that handrails are as long as the stairways. Check any carpeting to make sure that it is firmly attached to the stairs. Fix any carpet that is loose or worn. Avoid having throw rugs at the top or bottom of the stairs. If you do have throw rugs, attach them to the floor with carpet tape. Make sure that you have a light switch at the top of the stairs and the bottom of the stairs. If you do not have them, ask someone to add them for you. What else can I do to help prevent falls? Wear shoes that: Do not have high heels. Have rubber bottoms. Are comfortable and fit you well. Are closed at the toe. Do not wear sandals. If you use a stepladder: Make sure that it is  fully opened. Do not climb a closed stepladder. Make sure that both sides of the stepladder are locked into place. Ask someone to hold it for you, if possible. Clearly mark and make sure that you can see: Any grab bars or handrails. First and last steps. Where the edge of each step is. Use tools that help you move around (mobility aids) if they are needed. These include: Canes. Walkers. Scooters. Crutches. Turn on the lights when you go into a dark area. Replace any light bulbs as soon as they burn out. Set up your furniture so you have a clear path. Avoid moving your furniture around. If any of your floors are uneven, fix them. If there are any pets around you, be aware of where they are. Review your medicines with your doctor. Some medicines can make you feel dizzy. This can increase your chance of falling. Ask your doctor what other things that you can do to help prevent falls. This information is not intended to replace advice given to you by your health care provider. Make sure you discuss any questions you have with your health care provider. Document Released: 08/14/2009 Document Revised: 03/25/2016 Document Reviewed: 11/22/2014 Elsevier Interactive Patient Education  2017 [employer].

## 2022-08-26 NOTE — Progress Notes (Signed)
I connected with  Margaretmary Lombard on 08/26/22 by a audio enabled telemedicine application and verified that I am speaking with the correct person using two identifiers.  Patient Location: Home  Provider Location: Office/Clinic  I discussed the limitations of evaluation and management by telemedicine. The patient expressed understanding and agreed to proceed.   Subjective:   Shawn Wood is a 71 y.o. male who presents for an Initial Medicare Annual Wellness Visit.  Review of Systems     Cardiac Risk Factors include: advanced age (>66men, >28 women);hypertension;male gender;dyslipidemia;obesity (BMI >30kg/m2)     Objective:    Today's Vitals   08/26/22 1310  Weight: 257 lb (116.6 kg)   Body mass index is 39.08 kg/m.     08/26/2022    1:14 PM 06/11/2017   12:02 PM 03/03/2013   12:39 PM 03/03/2013   12:36 PM  Advanced Directives  Does Patient Have a Medical Advance Directive? Yes Yes Patient has advance directive, copy not in chart Patient has advance directive, copy not in chart  Type of Advance Directive [place];Living will Living will Living will Living will  Copy of [place] in Chart? No - copy requested     Pre-existing out of facility DNR order (yellow form or pink MOST form)   No No    Current Medications (verified) Outpatient Encounter Medications as of 08/26/2022  Medication Sig   aspirin EC 81 MG tablet Take 81 mg by mouth daily.   atorvastatin (LIPITOR) 20 MG tablet Take 1 tablet (20 mg total) by mouth once a week.   diclofenac (VOLTAREN) 75 MG EC tablet TAKE 1 TABLET BY MOUTH 2 TIMES DAILY   PARoxetine (PAXIL) 30 MG tablet TAKE 1 TABLET BY MOUTH EVERY DAY   SHINGRIX injection    [DISCONTINUED] predniSONE (DELTASONE) 20 MG tablet Take 2 pills for 3 days, 1 pill for 4 days   [DISCONTINUED] triamcinolone cream (KENALOG) 0.1 % Apply 1 application. topically 2 (two) times daily. For 7-10 days maximum. NOT to open area   No  facility-administered encounter medications on file as of 08/26/2022.    Allergies (verified) Patient has no known allergies.   History: Past Medical History:  Diagnosis Date   COLONIC POLYPS, HX OF 05/25/2007   HYPERGLYCEMIA 08/07/2008   Leg pain right leg   TINEA PEDIS 07/19/2007   Varicose veins    history of cellulitis   Past Surgical History:  Procedure Laterality Date   CATARACT EXTRACTION, BILATERAL     2020   KNEE SURGERY  1999   fracture   VARICOSE VEIN SURGERY     Family History  Problem Relation Age of Onset   Breast cancer Mother    Prostate cancer Father        late 27s   Colon cancer Father        late 29s, did not have colonscopies   Brain cancer Sister    Seizures Son    Social History   Socioeconomic History   Marital status: Married    Spouse name: Not on file   Number of children: Not on file   Years of education: Not on file   Highest education level: Not on file  Occupational History   Not on file  Tobacco Use   Smoking status: Never   Smokeless tobacco: Never  Vaping Use   Vaping Use: Not on file  Substance and Sexual Activity   Alcohol use: Not Currently    Comment:  occcassinal beer   Drug use: No   Sexual activity: Yes  Other Topics Concern   Not on file  Social History Narrative   Married 41 years (wife Charity fundraiser at [facility]) 2015 with 2 sons. 1 son with epilepsy stays at home and still has seizures. No grandkids.       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       Social Determinants of Corporate investment banker Strain: Low Risk  (08/26/2022)   Overall Financial Resource Strain (CARDIA)    Difficulty of Paying Living Expenses: Not hard at all  Food Insecurity: No Food Insecurity  (08/26/2022)   Hunger Vital Sign    Worried About Running Out of Food in the Last Year: Never true    Ran Out of Food in the Last Year: Never true  Transportation Needs: No Transportation Needs (08/26/2022)   PRAPARE - Administrator, Civil Service (Medical): No    Lack of Transportation (Non-Medical): No  Physical Activity: Sufficiently Active (08/26/2022)   Exercise Vital Sign    Days of Exercise per Week: 7 days    Minutes of Exercise per Session: 50 min  Stress: No Stress Concern Present (08/26/2022)   [employer] of Occupational Health - Occupational Stress Questionnaire    Feeling of Stress : Not at all  Social Connections: Socially Integrated (08/26/2022)   Social Connection and Isolation Panel [NHANES]    Frequency of Communication with Friends and Family: Three times a week    Frequency of Social Gatherings with Friends and Family: Three times a week    Attends Religious Services: More than 4 times per year    Active Member of Clubs or Organizations: Yes    Attends Banker Meetings: 1 to 4 times per year    Marital Status: Married    Tobacco Counseling Counseling given: Not Answered   Clinical Intake:  Pre-visit preparation completed: Yes  Pain : No/denies pain     BMI - recorded: 39.08 Nutritional Status: BMI > 30  Obese Nutritional Risks: None Diabetes: No  How often do you need to have someone help you when you read instructions, pamphlets, or other written materials from your doctor or pharmacy?: 1 - Never  Diabetic?no  Interpreter Needed?: No  Information entered by :: Lanier Ensign, LPN   Activities of Daily Living    08/26/2022    1:15 PM  In your present state of health, do you have any difficulty performing the following activities:  Hearing? 0  Vision? 0  Difficulty concentrating or making decisions? 0  Walking or climbing stairs? 0  Dressing or bathing? 0  Doing errands, shopping? 0  Preparing Food and  eating ? N  Using the Toilet? N  In the past six months, have you accidently leaked urine? N  Do you have problems with loss of bowel control? N  Managing your Medications? N  Managing your Finances? N  Housekeeping or managing your Housekeeping? N    Patient Care Team: Shelva Majestic, MD as PCP - General (Family Medicine)  Indicate any recent Medical Services you may have received from other than [facility] providers in the past year (date may be approximate).  Assessment:   This is a routine wellness examination for Pho[place]yx.  Hearing/Vision screen Hearing Screening - Comments:: Pt denies any hearing issues  Vision Screening - Comments:: Pt follows up with Dr Randon Goldsmith for annual eye exams   Dietary issues and exercise activities discussed: Current Exercise Habits: Home exercise routine, Type of exercise: walking, Time (Minutes): 45   Goals Addressed             This Visit's Progress    Patient Stated       Lose more weight        Depression Screen    08/26/2022    1:13 PM 02/22/2022    3:11 PM 12/15/2021    8:12 AM 06/11/2020   10:37 AM 12/12/2019   10:44 AM 06/11/2019    1:38 PM 03/10/2018   11:40 AM  PHQ 2/9 Scores  PHQ - 2 Score 0 0 0 0 0 0 0  PHQ- 9 Score   0 0 0 1 0    Fall Risk    08/26/2022    1:15 PM 02/22/2022    3:11 PM 12/15/2021    8:12 AM 03/10/2021    2:16 PM 12/12/2019   10:42 AM  Fall Risk   Falls in the past year? 0 0 0 0 0  Number falls in past yr: 0 0 0  0  Injury with Fall? 0 0 0  0  Risk for fall due to : Impaired vision No Fall Risks     Follow up Falls prevention discussed        FALL RISK PREVENTION PERTAINING TO THE HOME:  Any stairs in or around the home? Yes  If so, are there any without handrails? No  Home free of loose throw rugs in walkways, pet beds, electrical cords, etc? Yes  Adequate lighting in your home to reduce risk of falls? Yes   ASSISTIVE DEVICES UTILIZED TO PREVENT FALLS:  Life alert? No  Use of a cane, walker or  w/c? No  Grab bars in the bathroom? No  Shower chair or bench in shower? No  Elevated toilet seat or a handicapped toilet? No   TIMED UP AND GO:  Was the test performed? No .   Cognitive Function:        08/26/2022    1:16 PM  6CIT Screen  What Year? 0 points  What month? 0 points  What time? 0 points  Count back from 20 0 points  Months in reverse 4 points  Repeat phrase 4 points  Total Score 8 points    Immunizations Immunization History  Administered Date(s) Administered   Fluad Quad(high Dose 65+) 08/01/2019, 09/03/2020, 08/11/2021, 08/17/2022   Influenza Split 08/16/2011, 08/22/2012   Influenza Whole 11/01/2001, 08/16/2007, 08/07/2008, 08/13/2009, 07/23/2010   Influenza, High Dose Seasonal PF 07/28/2015, 09/14/2017, 07/24/2018   Influenza,inj,Quad PF,6+ Mos 08/15/2013, 08/14/2014, 08/01/2019   Influenza-Unspecified 08/02/2016   Moderna Sars-Covid-2 Vaccination 12/15/2019, 01/12/2020   PFIZER Comirnaty(Gray Top)Covid-19 Tri-Sucrose Vaccine 08/05/2022   Pfizer Covid-19 Vaccine Bivalent Booster 67yrs & up 08/11/2021   Pneumococcal Conjugate-13 11/28/2015   Pneumococcal Polysaccharide-23 06/03/2014   Rsv, Bivalent, Protein Subunit Rsvpref,pf Verdis Frederickson) 08/05/2022   Tdap 08/07/2008, 03/01/2011, 12/10/2012   Zoster Recombinat (Shingrix) 04/06/2022, 06/08/2022   Zoster, Live 03/01/2011    TDAP status: Up to date  Flu Vaccine status: Up to date  Pneumococcal vaccine status: Up to date  Covid-19 vaccine status: Completed vaccines  Qualifies for Shingles Vaccine? Yes   Zostavax completed Yes  Shingrix Completed?: Yes  Screening Tests Health Maintenance  Topic Date Due   COVID-19 Vaccine (5 - Mixed Product risk series) 09/30/2022   TETANUS/TDAP  12/10/2022   Medicare Annual Wellness (AWV)  09/26/2023   COLONOSCOPY (Pts 45-36yrs Insurance coverage will need to be confirmed)  12/28/2026   Pneumonia Vaccine 73+ Years old  Completed   INFLUENZA VACCINE   Completed   Hepatitis C Screening  Completed   Zoster Vaccines- Shingrix  Completed   HPV VACCINES  Aged Out    Health Maintenance  There are no preventive care reminders to display for this patient.   Colorectal cancer screening: Type of screening: Cologuard. Completed 12/28/21. Repeat every 3 years  Additional Screening:  Hepatitis C Screening:  Completed 03/10/18  Vision Screening: Recommended annual ophthalmology exams for early detection of glaucoma and other disorders of the eye. Is the patient up to date with their annual eye exam?  Yes  Who is the provider or what is the name of the office in which the patient attends annual eye exams? Dr Randon Goldsmith  If pt is not established with a provider, would they like to be referred to a provider to establish care? No .   Dental Screening: Recommended annual dental exams for proper oral hygiene  Community Resource Referral / Chronic Care Management: CRR required this visit?  No   CCM required this visit?  No      Plan:     I have personally reviewed and noted the following in the patient's chart:   Medical and social history Use of alcohol, tobacco or illicit drugs  Current medications and supplements including opioid prescriptions. Patient is not currently taking opioid prescriptions. Functional ability and status Nutritional status Physical activity Advanced directives List of other physicians Hospitalizations, surgeries, and ER visits in previous 12 months Vitals Screenings to include cognitive, depression, and falls Referrals and appointments  In addition, I have reviewed and discussed with patient certain preventive protocols, quality metrics, and best practice recommendations. A written personalized care plan for preventive services as well as general preventive health recommendations were provided to patient.     Marzella Schlein, LPN   02/58/[id number]   Nurse Notes: none

## 2022-08-31 DIAGNOSIS — I10 Essential (primary) hypertension: Secondary | ICD-10-CM | POA: Diagnosis not present

## 2022-09-01 ENCOUNTER — Encounter: Payer: Self-pay | Admitting: Family Medicine

## 2022-09-30 DIAGNOSIS — I10 Essential (primary) hypertension: Secondary | ICD-10-CM | POA: Diagnosis not present

## 2022-10-31 ENCOUNTER — Encounter: Payer: Self-pay | Admitting: Family Medicine

## 2022-10-31 DIAGNOSIS — I10 Essential (primary) hypertension: Secondary | ICD-10-CM | POA: Diagnosis not present

## 2022-11-02 ENCOUNTER — Other Ambulatory Visit: Payer: Self-pay

## 2022-11-02 MED ORDER — ATORVASTATIN CALCIUM 20 MG PO TABS: 20.0000 mg | ORAL_TABLET | ORAL | 3 refills | Status: DC

## 2022-12-30 DIAGNOSIS — I10 Essential (primary) hypertension: Secondary | ICD-10-CM | POA: Diagnosis not present

## 2023-01-03 ENCOUNTER — Other Ambulatory Visit: Payer: Self-pay

## 2023-01-03 ENCOUNTER — Encounter: Payer: Self-pay | Admitting: Family Medicine

## 2023-01-03 MED ORDER — DICLOFENAC SODIUM 75 MG PO TBEC: 75.0000 mg | DELAYED_RELEASE_TABLET | Freq: Two times a day (BID) | ORAL | 1 refills | Status: DC

## 2023-01-10 ENCOUNTER — Encounter: Payer: Self-pay | Admitting: Family

## 2023-01-10 ENCOUNTER — Ambulatory Visit (INDEPENDENT_AMBULATORY_CARE_PROVIDER_SITE_OTHER): Payer: PPO | Admitting: Family

## 2023-01-10 VITALS — BP 131/77 | HR 68 | Temp 98.0°F | Ht 68.0 in | Wt 251.0 lb

## 2023-01-10 DIAGNOSIS — B9789 Other viral agents as the cause of diseases classified elsewhere: Secondary | ICD-10-CM | POA: Diagnosis not present

## 2023-01-10 DIAGNOSIS — J329 Chronic sinusitis, unspecified: Secondary | ICD-10-CM

## 2023-01-10 MED ORDER — TRIAMCINOLONE ACETONIDE 55 MCG/ACT NA AERO: 1.0000 | INHALATION_SPRAY | Freq: Every day | NASAL | 0 refills | Status: DC

## 2023-01-10 NOTE — Progress Notes (Signed)
Patient ID: Shawn Wood, male    DOB: 1951/02/22, 72 y.o.   MRN: [id number]  Chief Complaint  Patient presents with   Sinus Problem    Pt c/o Sore throat, Dry cough and Nasal congestion since Saturday. Has tried OTC nyquil/dayquil which did give some relief. Sx worse at night.     HPI:      URI sx:  Pt c/o Sore throat, Dry cough and Nasal congestion since Saturday. Has tried OTC nyquil/dayquil which did give some relief. Sx worse at night. Reports sore throat but now just hurting when he coughs. Reports wife is at home but not having any sx.  Assessment & Plan:  1. Viral sinusitis - sending steroid nasal spray, advised on use & SE. Advised to use saline nasal spray tid & prior to steroid spray.  Increase water intake, ok to continue Nyquil qhs to help with cough & sleep.  - triamcinolone (NASACORT) 55 MCG/ACT AERO nasal inhaler; Place 1 spray into the nose daily. Start with 1 spray each side twice a day for 3 days, then reduce to daily.  Dispense: 1 each; Refill: 0  Subjective:    Outpatient Medications Prior to Visit  Medication Sig Dispense Refill   aspirin EC 81 MG tablet Take 81 mg by mouth daily.     atorvastatin (LIPITOR) 20 MG tablet Take 1 tablet (20 mg total) by mouth once a week. 13 tablet 3   diclofenac (VOLTAREN) 75 MG EC tablet Take 1 tablet (75 mg total) by mouth 2 (two) times daily. 180 tablet 1   PARoxetine (PAXIL) 30 MG tablet TAKE 1 TABLET BY MOUTH EVERY DAY 90 tablet 3   SHINGRIX injection      No facility-administered medications prior to visit.   Past Medical History:  Diagnosis Date   COLONIC POLYPS, HX OF 05/25/2007   HYPERGLYCEMIA 08/07/2008   Leg pain right leg   TINEA PEDIS 07/19/2007   Varicose veins    history of cellulitis   Past Surgical History:  Procedure Laterality Date   CATARACT EXTRACTION, BILATERAL     2020   KNEE SURGERY  1999   fracture   VARICOSE VEIN SURGERY     No Known Allergies    Objective:    Physical Exam Vitals and  nursing note reviewed.  Constitutional:      General: He is not in acute distress.    Appearance: Normal appearance.  HENT:     Head: Normocephalic.     Right Ear: Tympanic membrane and ear canal normal.     Left Ear: Tympanic membrane and ear canal normal.     Nose:     Right Sinus: No maxillary sinus tenderness or frontal sinus tenderness.     Left Sinus: No maxillary sinus tenderness or frontal sinus tenderness.     Mouth/Throat:     Mouth: Mucous membranes are moist.     Pharynx: No pharyngeal swelling, oropharyngeal exudate, posterior oropharyngeal erythema or uvula swelling.     Tonsils: No tonsillar exudate or tonsillar abscesses.  Cardiovascular:     Rate and Rhythm: Normal rate and regular rhythm.  Pulmonary:     Effort: Pulmonary effort is normal.     Breath sounds: Normal breath sounds.  Musculoskeletal:        General: Normal range of motion.     Cervical back: Normal range of motion.  Lymphadenopathy:     Head:     Right side of head: No preauricular or posterior  auricular adenopathy.     Left side of head: No preauricular or posterior auricular adenopathy.     Cervical: No cervical adenopathy.  Skin:    General: Skin is warm and dry.  Neurological:     Mental Status: He is alert and oriented to person, place, and time.  Psychiatric:        Mood and Affect: Mood normal.    Temp 98 F (36.7 C) (Temporal)   Ht '5\' 8"'$  (1.727 m)   Wt 251 lb (113.9 kg)   BMI 38.16 kg/m  Wt Readings from Last 3 Encounters:  01/10/23 251 lb (113.9 kg)  08/26/22 257 lb (116.6 kg)  03/02/22 257 lb 3.2 oz (116.7 kg)       Jeanie Sewer, NP

## 2023-01-10 NOTE — Patient Instructions (Addendum)
It was very nice to see you today!   I have sent over generic Nasacort which is a steroid nasal spray to help your symptoms.  Start this today, 1 squirt in each nostril twice a day for 3 days, then use just once a day, remember, it can take up to 2 days to start working! I also recommend starting an over the counter saline nasal spray and use several times per day to disinfect your sinuses and use this prior to using the Nasacort spray. OK to continue using the Nyquil at bedtime to help keep you from coughing and not sleeping. Increase your water intake to 8 cups daily!      PLEASE NOTE:  If you had any lab tests please let us know if you have not heard back within a few days. You may see your results on MyChart before we have a chance to review them but we will give you a call once they are reviewed by [place]. If we ordered any referrals today, please let us know if you have not heard from their office within the next week.

## 2023-01-11 ENCOUNTER — Telehealth: Payer: PPO | Admitting: Family Medicine

## 2023-01-11 DIAGNOSIS — J069 Acute upper respiratory infection, unspecified: Secondary | ICD-10-CM

## 2023-01-11 MED ORDER — BENZONATATE 100 MG PO CAPS: 100.0000 mg | ORAL_CAPSULE | Freq: Three times a day (TID) | ORAL | 0 refills | Status: DC | PRN

## 2023-01-11 NOTE — Patient Instructions (Signed)
Margaretmary Lombard, thank you for joining Perlie Mayo, NP for today's virtual visit.  While this provider is not your primary care provider (PCP), if your PCP is located in our provider database this encounter information will be shared with them immediately following your visit.   [facility] account gives you access to today's visit and all your visits, tests, and labs performed at [facility] " click here if you don't have a [place] account or go to [email or web address]  Consent: (Patient) Margaretmary Lombard provided verbal consent for this virtual visit at the beginning of the encounter.  Current Medications:  Current Outpatient Medications:    benzonatate (TESSALON) 100 MG capsule, Take 1 capsule (100 mg total) by mouth 3 (three) times daily as needed for cough., Disp: 30 capsule, Rfl: 0   aspirin EC 81 MG tablet, Take 81 mg by mouth daily., Disp: , Rfl:    atorvastatin (LIPITOR) 20 MG tablet, Take 1 tablet (20 mg total) by mouth once a week., Disp: 13 tablet, Rfl: 3   diclofenac (VOLTAREN) 75 MG EC tablet, Take 1 tablet (75 mg total) by mouth 2 (two) times daily., Disp: 180 tablet, Rfl: 1   PARoxetine (PAXIL) 30 MG tablet, TAKE 1 TABLET BY MOUTH EVERY DAY, Disp: 90 tablet, Rfl: 3   SHINGRIX injection, , Disp: , Rfl:    triamcinolone (NASACORT) 55 MCG/ACT AERO nasal inhaler, Place 1 spray into the nose daily. Start with 1 spray each side twice a day for 3 days, then reduce to daily., Disp: 1 each, Rfl: 0   Medications ordered in this encounter:  Meds ordered this encounter  Medications   benzonatate (TESSALON) 100 MG capsule    Sig: Take 1 capsule (100 mg total) by mouth 3 (three) times daily as needed for cough.    Dispense:  30 capsule    Refill:  0    Order Specific Question:   Supervising Provider    Answer:   Chase Picket [id number]     *If you need refills on other medications prior to your next appointment, please contact your  pharmacy*  Follow-Up: Call back or seek an in-person evaluation if the symptoms worsen or if the condition fails to improve as anticipated.  [place] [phone]  Other Instructions  - Take meds as prescribed - Rest voice - Use a cool mist humidifier especially during the winter months when heat dries out the air. - Use saline nose sprays frequently to help soothe nasal passages if they are drying out. - Stay hydrated by drinking plenty of fluids - Keep thermostat turn down low to prevent drying out which can cause a dry cough. - For any cough or congestion- robitussin DM or Delsym as needed - For fever or aches or pains- take tylenol or ibuprofen as directed on bottle             * for fevers greater than 101 orally you may alternate ibuprofen and tylenol every 3 hours.  If you do not improve you will need a follow up visit in person.                   If you have been instructed to have an in-person evaluation today at a local Urgent Care facility, please use the link below. It will take you to a list of all of our available [facility], including address, phone number and hours of operation. Please  do not delay care.  [facility]  If you or a family member do not have a primary care provider, use the link below to schedule a visit and establish care. When you choose a [facility] primary care physician or advanced practice provider, you gain a long-term partner in health. Find a Primary Care Provider  Learn more about [place]'s in-office and virtual care options: [facility]

## 2023-01-11 NOTE — Progress Notes (Signed)
Virtual Visit Consent   Shawn Wood, you are scheduled for a virtual visit with a [facility] provider today. Just as with appointments in the office, your consent must be obtained to participate. Your consent will be active for this visit and any virtual visit you may have with one of our providers in the next 365 days. If you have a MyChart account, a copy of this consent can be sent to you electronically.  As this is a virtual visit, video technology does not allow for your provider to perform a traditional examination. This may limit your provider's ability to fully assess your condition. If your provider identifies any concerns that need to be evaluated in person or the need to arrange testing (such as labs, EKG, etc.), we will make arrangements to do so. Although advances in technology are sophisticated, we cannot ensure that it will always work on either your end or our end. If the connection with a video visit is poor, the visit may have to be switched to a telephone visit. With either a video or telephone visit, we are not always able to ensure that we have a secure connection.  By engaging in this virtual visit, you consent to the provision of healthcare and authorize for your insurance to be billed (if applicable) for the services provided during this visit. Depending on your insurance coverage, you may receive a charge related to this service.  I need to obtain your verbal consent now. Are you willing to proceed with your visit today? Shawn Wood has provided verbal consent on 01/11/2023 for a virtual visit (video or telephone). Perlie Mayo, NP  Date: 01/11/2023 2:17 PM  Virtual Visit via Video Note   I, Perlie Mayo, connected with  Shawn Wood  ([id number], [id number]) on 01/11/23 at  2:15 PM EDT by a video-enabled telemedicine application and verified that I am speaking with the correct person using two identifiers.  Location: Patient: Virtual Visit Location Patient:  Home Provider: Virtual Visit Location Provider: Home Office   I discussed the limitations of evaluation and management by telemedicine and the availability of in person appointments. The patient expressed understanding and agreed to proceed.    History of Present Illness: Shawn Wood is a 72 y.o. who identifies as a male who was assigned male at birth, and is being seen today for   Onset was cough with worsening cough-dry,  Associated symptoms are cough, nasal congestion-clearing throat alot, fever 100.8 Modifying factors are day quil, nyquil, tylenol, and nasacort. Denies chest pain, shortness of breath, chills  Exposure to sick contacts- unknown COVID test: not tested Vaccines: covid and flu   Problems:  Patient Active Problem List   Diagnosis Date Noted   Essential hypertension 03/02/2022   Bilateral primary osteoarthritis of knee 02/01/2017   Unilateral primary osteoarthritis, right knee 11/24/2016   Atypical chest pain 11/07/2014   Hyperlipidemia, unspecified 06/04/2014   Depression 06/03/2014   Morbid obesity ([place]) 06/03/2014   Hematuria 06/02/2014   Hyperglycemia 04/25/2013   Traumatic compression fracture of thoracic vertebra ([place]) 03/04/2013    Class: Acute   Fracture of multiple ribs 03/04/2013    Class: Acute   Closed right scapular fracture 03/03/2013    Class: Acute   CELLULITIS, LEG, LEFT 11/06/2009   COLONIC POLYPS, HX OF 05/25/2007    Allergies: No Known Allergies Medications:  Current Outpatient Medications:    aspirin EC 81 MG tablet, Take 81 mg by mouth daily., Disp: ,  Rfl:    atorvastatin (LIPITOR) 20 MG tablet, Take 1 tablet (20 mg total) by mouth once a week., Disp: 13 tablet, Rfl: 3   diclofenac (VOLTAREN) 75 MG EC tablet, Take 1 tablet (75 mg total) by mouth 2 (two) times daily., Disp: 180 tablet, Rfl: 1   PARoxetine (PAXIL) 30 MG tablet, TAKE 1 TABLET BY MOUTH EVERY DAY, Disp: 90 tablet, Rfl: 3   SHINGRIX injection, , Disp: , Rfl:     triamcinolone (NASACORT) 55 MCG/ACT AERO nasal inhaler, Place 1 spray into the nose daily. Start with 1 spray each side twice a day for 3 days, then reduce to daily., Disp: 1 each, Rfl: 0  Observations/Objective: Patient is well-developed, well-nourished in no acute distress.  Resting comfortably  at home.  Head is normocephalic, atraumatic.  No labored breathing.  Speech is clear and coherent with logical content.  Patient is alert and oriented at baseline.  Dry cough present on video  Assessment and Plan:   1. Viral URI with cough  - benzonatate (TESSALON) 100 MG capsule; Take 1 capsule (100 mg total) by mouth 3 (three) times daily as needed for cough.  Dispense: 30 capsule; Refill: 0  - Take meds as prescribed - Rest voice - Use a cool mist humidifier especially during the winter months when heat dries out the air. - Use saline nose sprays frequently to help soothe nasal passages if they are drying out. - Stay hydrated by drinking plenty of fluids - Keep thermostat turn down low to prevent drying out which can cause a dry cough. - For any cough or congestion- robitussin DM or Delsym as needed - For fever or aches or pains- take tylenol or ibuprofen as directed on bottle             * for fevers greater than 101 orally you may alternate ibuprofen and tylenol every 3 hours.  If you do not improve you will need a follow up visit in person.                 Reviewed side effects, risks and benefits of medication.    Patient acknowledged agreement and understanding of the plan.   Past Medical, Surgical, Social History, Allergies, and Medications have been Reviewed.     Follow Up Instructions: I discussed the assessment and treatment plan with the patient. The patient was provided an opportunity to ask questions and all were answered. The patient agreed with the plan and demonstrated an understanding of the instructions.  A copy of instructions were sent to the patient via  MyChart unless otherwise noted below.    The patient was advised to call back or seek an in-person evaluation if the symptoms worsen or if the condition fails to improve as anticipated.  Time:  I spent 8 minutes with the patient via telehealth technology discussing the above problems/concerns.    Perlie Mayo, NP

## 2023-01-16 ENCOUNTER — Telehealth: Payer: PPO | Admitting: Physician Assistant

## 2023-01-16 DIAGNOSIS — B9689 Other specified bacterial agents as the cause of diseases classified elsewhere: Secondary | ICD-10-CM

## 2023-01-16 DIAGNOSIS — J069 Acute upper respiratory infection, unspecified: Secondary | ICD-10-CM | POA: Diagnosis not present

## 2023-01-16 MED ORDER — PREDNISONE 20 MG PO TABS: 40.0000 mg | ORAL_TABLET | Freq: Every day | ORAL | 0 refills | Status: DC

## 2023-01-16 MED ORDER — AMOXICILLIN-POT CLAVULANATE 875-125 MG PO TABS: 1.0000 | ORAL_TABLET | Freq: Two times a day (BID) | ORAL | 0 refills | Status: DC

## 2023-01-16 NOTE — Patient Instructions (Signed)
Shawn Wood, thank you for joining Mar Daring, PA-C for today's virtual visit.  While this provider is not your primary care provider (PCP), if your PCP is located in our provider database this encounter information will be shared with them immediately following your visit.   [facility] account gives you access to today's visit and all your visits, tests, and labs performed at [facility] " click here if you don't have a [facility] account or go to [email or web address]  Consent: (Patient) Shawn Wood provided verbal consent for this virtual visit at the beginning of the encounter.  Current Medications:  Current Outpatient Medications:    amoxicillin-clavulanate (AUGMENTIN) 875-125 MG tablet, Take 1 tablet by mouth 2 (two) times daily., Disp: 20 tablet, Rfl: 0   predniSONE (DELTASONE) 20 MG tablet, Take 2 tablets (40 mg total) by mouth daily with breakfast., Disp: 10 tablet, Rfl: 0   aspirin EC 81 MG tablet, Take 81 mg by mouth daily., Disp: , Rfl:    atorvastatin (LIPITOR) 20 MG tablet, Take 1 tablet (20 mg total) by mouth once a week., Disp: 13 tablet, Rfl: 3   benzonatate (TESSALON) 100 MG capsule, Take 1 capsule (100 mg total) by mouth 3 (three) times daily as needed for cough., Disp: 30 capsule, Rfl: 0   diclofenac (VOLTAREN) 75 MG EC tablet, Take 1 tablet (75 mg total) by mouth 2 (two) times daily., Disp: 180 tablet, Rfl: 1   PARoxetine (PAXIL) 30 MG tablet, TAKE 1 TABLET BY MOUTH EVERY DAY, Disp: 90 tablet, Rfl: 3   SHINGRIX injection, , Disp: , Rfl:    triamcinolone (NASACORT) 55 MCG/ACT AERO nasal inhaler, Place 1 spray into the nose daily. Start with 1 spray each side twice a day for 3 days, then reduce to daily., Disp: 1 each, Rfl: 0   Medications ordered in this encounter:  Meds ordered this encounter  Medications   amoxicillin-clavulanate (AUGMENTIN) 875-125 MG tablet    Sig: Take 1 tablet by mouth 2 (two) times daily.     Dispense:  20 tablet    Refill:  0    Order Specific Question:   Supervising Provider    Answer:   Chase Picket [id number]   predniSONE (DELTASONE) 20 MG tablet    Sig: Take 2 tablets (40 mg total) by mouth daily with breakfast.    Dispense:  10 tablet    Refill:  0    Order Specific Question:   Supervising Provider    Answer:   Chase Picket [id number]     *If you need refills on other medications prior to your next appointment, please contact your pharmacy*  Follow-Up: Call back or seek an in-person evaluation if the symptoms worsen or if the condition fails to improve as anticipated.  [place] [phone]  Other Instructions  Sinus Infection, Adult A sinus infection, also called sinusitis, is inflammation of your sinuses. Sinuses are hollow spaces in the bones around your face. Your sinuses are located: Around your eyes. In the middle of your forehead. Behind your nose. In your cheekbones. Mucus normally drains out of your sinuses. When your nasal tissues become inflamed or swollen, mucus can become trapped or blocked. This allows bacteria, viruses, and fungi to grow, which leads to infection. Most infections of the sinuses are caused by a virus. A sinus infection can develop quickly. It can last for up to 4 weeks (acute) or for more than 12 weeks (chronic).  A sinus infection often develops after a cold. What are the causes? This condition is caused by anything that creates swelling in the sinuses or stops mucus from draining. This includes: Allergies. Asthma. Infection from bacteria or viruses. Deformities or blockages in your nose or sinuses. Abnormal growths in the nose (nasal polyps). Pollutants, such as chemicals or irritants in the air. Infection from fungi. This is rare. What increases the risk? You are more likely to develop this condition if you: Have a weak body defense system (immune system). Do a lot of swimming or diving. Overuse  nasal sprays. Smoke. What are the signs or symptoms? The main symptoms of this condition are pain and a feeling of pressure around the affected sinuses. Other symptoms include: Stuffy nose or congestion that makes it difficult to breathe through your nose. Thick yellow or greenish drainage from your nose. Tenderness, swelling, and warmth over the affected sinuses. A cough that may get worse at night. Decreased sense of smell and taste. Extra mucus that collects in the throat or the back of the nose (postnasal drip) causing a sore throat or bad breath. Tiredness (fatigue). Fever. How is this diagnosed? This condition is diagnosed based on: Your symptoms. Your medical history. A physical exam. Tests to find out if your condition is acute or chronic. This may include: Checking your nose for nasal polyps. Viewing your sinuses using a device that has a light (endoscope). Testing for allergies or bacteria. Imaging tests, such as an MRI or CT scan. In rare cases, a bone biopsy may be done to rule out more serious types of fungal sinus disease. How is this treated? Treatment for a sinus infection depends on the cause and whether your condition is chronic or acute. If caused by a virus, your symptoms should go away on their own within 10 days. You may be given medicines to relieve symptoms. They include: Medicines that shrink swollen nasal passages (decongestants). A spray that eases inflammation of the nostrils (topical intranasal corticosteroids). Rinses that help get rid of thick mucus in your nose (nasal saline washes). Medicines that treat allergies (antihistamines). Over-the-counter pain relievers. If caused by bacteria, your health care provider may recommend waiting to see if your symptoms improve. Most bacterial infections will get better without antibiotic medicine. You may be given antibiotics if you have: A severe infection. A weak immune system. If caused by narrow nasal  passages or nasal polyps, surgery may be needed. Follow these instructions at home: Medicines Take, use, or apply over-the-counter and prescription medicines only as told by your health care provider. These may include nasal sprays. If you were prescribed an antibiotic medicine, take it as told by your health care provider. Do not stop taking the antibiotic even if you start to feel better. Hydrate and humidify  Drink enough fluid to keep your urine pale yellow. Staying hydrated will help to thin your mucus. Use a cool mist humidifier to keep the humidity level in your home above 50%. Inhale steam for 10-15 minutes, 3-4 times a day, or as told by your health care provider. You can do this in the bathroom while a hot shower is running. Limit your exposure to cool or dry air. Rest Rest as much as possible. Sleep with your head raised (elevated). Make sure you get enough sleep each night. General instructions  Apply a warm, moist washcloth to your face 3-4 times a day or as told by your health care provider. This will help with discomfort. Use nasal  saline washes as often as told by your health care provider. Wash your hands often with soap and water to reduce your exposure to germs. If soap and water are not available, use hand sanitizer. Do not smoke. Avoid being around people who are smoking (secondhand smoke). Keep all follow-up visits. This is important. Contact a health care provider if: You have a fever. Your symptoms get worse. Your symptoms do not improve within 10 days. Get help right away if: You have a severe headache. You have persistent vomiting. You have severe pain or swelling around your face or eyes. You have vision problems. You develop confusion. Your neck is stiff. You have trouble breathing. These symptoms may be an emergency. Get help right away. Call 911. Do not wait to see if the symptoms will go away. Do not drive yourself to the hospital. Summary A sinus  infection is soreness and inflammation of your sinuses. Sinuses are hollow spaces in the bones around your face. This condition is caused by nasal tissues that become inflamed or swollen. The swelling traps or blocks the flow of mucus. This allows bacteria, viruses, and fungi to grow, which leads to infection. If you were prescribed an antibiotic medicine, take it as told by your health care provider. Do not stop taking the antibiotic even if you start to feel better. Keep all follow-up visits. This is important. This information is not intended to replace advice given to you by your health care provider. Make sure you discuss any questions you have with your health care provider. Document Revised: 09/22/2021 Document Reviewed: 09/22/2021 Elsevier Patient Education  [place].    If you have been instructed to have an in-person evaluation today at a local Urgent Care facility, please use the link below. It will take you to a list of all of our available [facility], including address, phone number and hours of operation. Please do not delay care.  [facility]  If you or a family member do not have a primary care provider, use the link below to schedule a visit and establish care. When you choose a [facility] primary care physician or advanced practice provider, you gain a long-term partner in health. Find a Primary Care Provider  Learn more about 's in-office and virtual care options: [facility]

## 2023-01-16 NOTE — Progress Notes (Signed)
Virtual Visit Consent   [name], you are scheduled for a virtual visit with a [facility] provider today. Just as with appointments in the office, your consent must be obtained to participate. Your consent will be active for this visit and any virtual visit you may have with one of our providers in the next 365 days. If you have a MyChart account, a copy of this consent can be sent to you electronically.  As this is a virtual visit, video technology does not allow for your provider to perform a traditional examination. This may limit your provider's ability to fully assess your condition. If your provider identifies any concerns that need to be evaluated in person or the need to arrange testing (such as labs, EKG, etc.), we will make arrangements to do so. Although advances in technology are sophisticated, we cannot ensure that it will always work on either your end or our end. If the connection with a video visit is poor, the visit may have to be switched to a telephone visit. With either a video or telephone visit, we are not always able to ensure that we have a secure connection.  By engaging in this virtual visit, you consent to the provision of healthcare and authorize for your insurance to be billed (if applicable) for the services provided during this visit. Depending on your insurance coverage, you may receive a charge related to this service.  I need to obtain your verbal consent now. Are you willing to proceed with your visit today? SHERRILL TESCHENDORF has provided verbal consent on 01/16/2023 for a virtual visit (video or telephone). Mar Daring, PA-C  Date: 01/16/2023 11:06 AM  Virtual Visit via Video Note   I, Mar Daring, connected with  Shawn Wood  ([id number], May 25, 1951) on 01/16/23 at 11:30 AM EDT by a video-enabled telemedicine application and verified that I am speaking with the correct person using two identifiers.  Location: Patient: Virtual Visit Location  Patient: Home Provider: Virtual Visit Location Provider: Home Office   I discussed the limitations of evaluation and management by telemedicine and the availability of in person appointments. The patient expressed understanding and agreed to proceed.    History of Present Illness: Shawn Wood is a 72 y.o. who identifies as a male who was assigned male at birth, and is being seen today for URI symptoms.  HPI: URI  This is a new problem. The current episode started 1 to 4 weeks ago. The problem has been gradually worsening. The maximum temperature recorded prior to his arrival was 100.4 - 100.9 F (100.6). The fever has been present for 1 to 2 days. Associated symptoms include congestion, coughing, ear pain, rhinorrhea and a sore throat. Pertinent negatives include no diarrhea, headaches, nausea, plugged ear sensation, sinus pain, vomiting or wheezing. Associated symptoms comments: Post nasal drainage. Treatments tried: mucinex, nasacort, tessalon perles. The treatment provided no relief.     Problems:  Patient Active Problem List   Diagnosis Date Noted   Essential hypertension 03/02/2022   Bilateral primary osteoarthritis of knee 02/01/2017   Unilateral primary osteoarthritis, right knee 11/24/2016   Atypical chest pain 11/07/2014   Hyperlipidemia, unspecified 06/04/2014   Depression 06/03/2014   Morbid obesity ([place]) 06/03/2014   Hematuria 06/02/2014   Hyperglycemia 04/25/2013   Traumatic compression fracture of thoracic vertebra ([place]) 03/04/2013    Class: Acute   Fracture of multiple ribs 03/04/2013    Class: Acute   Closed right scapular fracture 03/03/2013  Class: Acute   CELLULITIS, LEG, LEFT 11/06/2009   COLONIC POLYPS, HX OF 05/25/2007    Allergies: No Known Allergies Medications:  Current Outpatient Medications:    amoxicillin-clavulanate (AUGMENTIN) 875-125 MG tablet, Take 1 tablet by mouth 2 (two) times daily., Disp: 20 tablet, Rfl: 0   predniSONE (DELTASONE) 20 MG  tablet, Take 2 tablets (40 mg total) by mouth daily with breakfast., Disp: 10 tablet, Rfl: 0   aspirin EC 81 MG tablet, Take 81 mg by mouth daily., Disp: , Rfl:    atorvastatin (LIPITOR) 20 MG tablet, Take 1 tablet (20 mg total) by mouth once a week., Disp: 13 tablet, Rfl: 3   benzonatate (TESSALON) 100 MG capsule, Take 1 capsule (100 mg total) by mouth 3 (three) times daily as needed for cough., Disp: 30 capsule, Rfl: 0   diclofenac (VOLTAREN) 75 MG EC tablet, Take 1 tablet (75 mg total) by mouth 2 (two) times daily., Disp: 180 tablet, Rfl: 1   PARoxetine (PAXIL) 30 MG tablet, TAKE 1 TABLET BY MOUTH EVERY DAY, Disp: 90 tablet, Rfl: 3   SHINGRIX injection, , Disp: , Rfl:    triamcinolone (NASACORT) 55 MCG/ACT AERO nasal inhaler, Place 1 spray into the nose daily. Start with 1 spray each side twice a day for 3 days, then reduce to daily., Disp: 1 each, Rfl: 0  Observations/Objective: Patient is well-developed, well-nourished in no acute distress.  Resting comfortably at home.  Head is normocephalic, atraumatic.  No labored breathing.  Speech is clear and coherent with logical content.  Patient is alert and oriented at baseline.    Assessment and Plan: 1. Bacterial upper respiratory infection - amoxicillin-clavulanate (AUGMENTIN) 875-125 MG tablet; Take 1 tablet by mouth 2 (two) times daily.  Dispense: 20 tablet; Refill: 0 - predniSONE (DELTASONE) 20 MG tablet; Take 2 tablets (40 mg total) by mouth daily with breakfast.  Dispense: 10 tablet; Refill: 0  - Worsening symptoms that have not responded to OTC medications.  - Will give Augmentin and Prednisone - Continue allergy medications.  - Steam and humidifier can help - Stay well hydrated and get plenty of rest.  - Seek in person evaluation if no symptom improvement or if symptoms worsen   Follow Up Instructions: I discussed the assessment and treatment plan with the patient. The patient was provided an opportunity to ask questions and  all were answered. The patient agreed with the plan and demonstrated an understanding of the instructions.  A copy of instructions were sent to the patient via MyChart unless otherwise noted below.    The patient was advised to call back or seek an in-person evaluation if the symptoms worsen or if the condition fails to improve as anticipated.  Time:  I spent 8 minutes with the patient via telehealth technology discussing the above problems/concerns.    Mar Daring, PA-C

## 2023-01-30 DIAGNOSIS — I10 Essential (primary) hypertension: Secondary | ICD-10-CM | POA: Diagnosis not present

## 2023-02-18 DIAGNOSIS — I10 Essential (primary) hypertension: Secondary | ICD-10-CM | POA: Diagnosis not present

## 2023-03-01 DIAGNOSIS — I10 Essential (primary) hypertension: Secondary | ICD-10-CM | POA: Diagnosis not present

## 2023-04-01 DIAGNOSIS — I10 Essential (primary) hypertension: Secondary | ICD-10-CM | POA: Diagnosis not present

## 2023-05-27 ENCOUNTER — Encounter: Payer: Self-pay | Admitting: *Deleted

## 2023-05-27 ENCOUNTER — Telehealth: Payer: Self-pay | Admitting: *Deleted

## 2023-06-01 DIAGNOSIS — I10 Essential (primary) hypertension: Secondary | ICD-10-CM | POA: Diagnosis not present

## 2023-06-13 ENCOUNTER — Other Ambulatory Visit: Payer: Self-pay | Admitting: Family Medicine

## 2023-06-13 ENCOUNTER — Encounter: Payer: Self-pay | Admitting: Family Medicine

## 2023-06-14 ENCOUNTER — Other Ambulatory Visit: Payer: Self-pay

## 2023-06-14 MED ORDER — PAROXETINE HCL 30 MG PO TABS: 30.0000 mg | ORAL_TABLET | Freq: Every day | ORAL | 3 refills | Status: DC

## 2023-06-28 ENCOUNTER — Ambulatory Visit (INDEPENDENT_AMBULATORY_CARE_PROVIDER_SITE_OTHER): Payer: PPO

## 2023-06-28 VITALS — Wt 240.0 lb

## 2023-06-28 DIAGNOSIS — Z0000 Encounter for general adult medical examination without abnormal findings: Secondary | ICD-10-CM

## 2023-06-28 NOTE — Patient Instructions (Signed)
Shawn Wood , Thank you for taking time to come for your Medicare Wellness Visit. I appreciate your ongoing commitment to your health goals. Please review the following plan we discussed and let me know if I can assist you in the future.   Referrals/Orders/Follow-Ups/Clinician Recommendations: continue to walk daily   This is a list of the screening recommended for you and due dates:  Health Maintenance  Topic Date Due   COVID-19 Vaccine (5 - 2023-24 season) 09/30/2022   DTaP/Tdap/Td vaccine (4 - Td or Tdap) 12/10/2022   Flu Shot  06/02/2023   Medicare Annual Wellness Visit  08/27/2023   Colon Cancer Screening  12/28/2026   Pneumonia Vaccine  Completed   Hepatitis C Screening  Completed   Zoster (Shingles) Vaccine  Completed   HPV Vaccine  Aged Out    Advanced directives: (Copy Requested) Please bring a copy of your health care power of attorney and living will to the office to be added to your chart at your convenience.  Next Medicare Annual Wellness Visit scheduled for next year: Yes

## 2023-06-28 NOTE — Progress Notes (Signed)
Subjective:   Shawn Wood is a 72 y.o. male who presents for Medicare Annual/Subsequent preventive examination.  Visit Complete: Virtual  I connected with  Shawn Wood on 06/28/23 by a audio enabled telemedicine application and verified that I am speaking with the correct person using two identifiers.  Patient Location: Home  Provider Location: Office/Clinic  I discussed the limitations of evaluation and management by telemedicine. The patient expressed understanding and agreed to proceed.    Vital Signs: Unable to obtain new vitals due to this being a telehealth visit.   Review of Systems     Cardiac Risk Factors include: advanced age (>34men, >66 women)     Objective:    Today's Vitals   06/28/23 1431  Weight: 240 lb (108.9 kg)   Body mass index is 36.49 kg/m.     06/28/2023    2:34 PM 08/26/2022    1:14 PM 06/11/2017   12:02 PM 03/03/2013   12:39 PM 03/03/2013   12:36 PM  Advanced Directives  Does Patient Have a Medical Advance Directive? Yes Yes Yes Patient has advance directive, copy not in chart Patient has advance directive, copy not in chart  Type of Advance Directive Healthcare Power of [place];Living will Healthcare Power of [place];Living will Living will Living will Living will  Copy of Healthcare Power of Attorney in Chart? No - copy requested No - copy requested     Pre-existing out of facility DNR order (yellow form or pink MOST form)    No No    Current Medications (verified) Outpatient Encounter Medications as of 06/28/2023  Medication Sig   aspirin EC 81 MG tablet Take 81 mg by mouth daily.   atorvastatin (LIPITOR) 20 MG tablet Take 1 tablet (20 mg total) by mouth once a week.   diclofenac (VOLTAREN) 75 MG EC tablet Take 1 tablet (75 mg total) by mouth 2 (two) times daily.   PARoxetine (PAXIL) 30 MG tablet Take 1 tablet (30 mg total) by mouth daily.   SHINGRIX injection    [DISCONTINUED] amoxicillin-clavulanate (AUGMENTIN) 875-125 MG tablet Take  1 tablet by mouth 2 (two) times daily.   [DISCONTINUED] benzonatate (TESSALON) 100 MG capsule Take 1 capsule (100 mg total) by mouth 3 (three) times daily as needed for cough.   [DISCONTINUED] PARoxetine (PAXIL) 30 MG tablet Take 1 tablet by mouth once daily   [DISCONTINUED] predniSONE (DELTASONE) 20 MG tablet Take 2 tablets (40 mg total) by mouth daily with breakfast.   [DISCONTINUED] triamcinolone (NASACORT) 55 MCG/ACT AERO nasal inhaler Place 1 spray into the nose daily. Start with 1 spray each side twice a day for 3 days, then reduce to daily.   No facility-administered encounter medications on file as of 06/28/2023.    Allergies (verified) Patient has no known allergies.   History: Past Medical History:  Diagnosis Date   COLONIC POLYPS, HX OF 05/25/2007   HYPERGLYCEMIA 08/07/2008   Leg pain right leg   TINEA PEDIS 07/19/2007   Varicose veins    history of cellulitis   Past Surgical History:  Procedure Laterality Date   CATARACT EXTRACTION, BILATERAL     2020   KNEE SURGERY  1999   fracture   VARICOSE VEIN SURGERY     Family History  Problem Relation Age of Onset   Breast cancer Mother    Prostate cancer Father        late 8s   Colon cancer Father        late 2s, did  not have colonscopies   Brain cancer Sister    Seizures Son    Social History   Socioeconomic History   Marital status: Married    Spouse name: Not on file   Number of children: Not on file   Years of education: Not on file   Highest education level: Not on file  Occupational History   Not on file  Tobacco Use   Smoking status: Never   Smokeless tobacco: Never  Vaping Use   Vaping status: Not on file  Substance and Sexual Activity   Alcohol use: Not Currently    Comment: occcassinal beer   Drug use: No   Sexual activity: Yes  Other Topics Concern   Not on file  Social History Narrative   Married 41 years (wife Charity fundraiser at [facility] [employer]) 2015 with 2 sons. 1 son with epilepsy stays at home  and still has seizures. No grandkids.       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       Social Determinants of Corporate investment banker Strain: Low Risk  (06/28/2023)   Overall Financial Resource Strain (CARDIA)    Difficulty of Paying Living Expenses: Not hard at all  Food Insecurity: No Food Insecurity (06/28/2023)   Hunger Vital Sign    Worried About Running Out of Food in the Last Year: Never true    Ran Out of Food in the Last Year: Never true  Transportation Needs: No Transportation Needs (06/28/2023)   PRAPARE - Administrator, Civil Service (Medical): No    Lack of Transportation (Non-Medical): No  Physical Activity: Sufficiently Active (06/28/2023)   Exercise Vital Sign    Days of Exercise per Week: 7 days    Minutes of Exercise per Session: 30 min  Stress: No Stress Concern Present (06/28/2023)   [employer] of Occupational Health - Occupational Stress Questionnaire    Feeling of Stress : Not at all  Social Connections: Socially Integrated (06/28/2023)   Social Connection and Isolation Panel [NHANES]    Frequency of Communication with Friends and Family: More than three times a week    Frequency of Social Gatherings with Friends and Family: More than three times a week    Attends Religious Services: More than 4 times per year    Active Member of [employer] or Organizations: Yes    Attends Banker Meetings: 1 to 4 times per year    Marital Status: Married    Tobacco Counseling Counseling given: Not Answered   Clinical Intake:  Pre-visit preparation completed: Yes  Pain : No/denies pain     BMI - recorded: 36.49 Nutritional Status: BMI > 30  Obese Nutritional Risks: None Diabetes: No  How often do  you need to have someone help you when you read instructions, pamphlets, or other written materials from your doctor or pharmacy?: 1 - Never  Interpreter Needed?: No  Information entered by :: Lanier Ensign, LPN   Activities of Daily Living    06/28/2023    2:32 PM 08/26/2022    1:15 PM  In your present state of health, do you have any difficulty performing the following activities:  Hearing? 0 0  Vision? 0 0  Difficulty concentrating or making decisions? 0 0  Walking or climbing stairs? 0 0  Dressing or bathing? 0 0  Doing errands, shopping? 0 0  Preparing Food and eating ? N N  Using the Toilet? N N  In the past six months, have you accidently leaked urine? N N  Do you have problems with loss of bowel control? N N  Managing your Medications? N N  Managing your Finances? N N  Housekeeping or managing your Housekeeping? N N    Patient Care Team: Shelva Majestic, MD as PCP - General (Family Medicine)  Indicate any recent Medical Services you may have received from other than [facility] providers in the past year (date may be approximate).     Assessment:   This is a routine wellness examination for [place].  Hearing/Vision screen Hearing Screening - Comments:: Pt denies any hearing issues  Vision Screening - Comments:: Pt follows up with Dr Randon Goldsmith for annual eye exams   Dietary issues and exercise activities discussed:     Goals Addressed             This Visit's Progress    Patient Stated       Continue to walk daily        Depression Screen    06/28/2023    2:34 PM 01/10/2023    1:07 PM 08/26/2022    1:13 PM 02/22/2022    3:11 PM 12/15/2021    8:12 AM 06/11/2020   10:37 AM 12/12/2019   10:44 AM  PHQ 2/9 Scores  PHQ - 2 Score 0 0 0 0 0 0 0  PHQ- 9 Score  0   0 0 0    Fall Risk    06/28/2023    2:36 PM 08/26/2022    1:15 PM 02/22/2022    3:11 PM 12/15/2021    8:12 AM 03/10/2021    2:16 PM  Fall Risk   Falls in the past year? 0 0 0 0 0  Number falls in  past yr: 0 0 0 0   Injury with Fall? 0 0 0 0   Risk for fall due to : Impaired vision Impaired vision No Fall Risks    Follow up Falls prevention discussed Falls prevention discussed       MEDICARE RISK AT HOME: Medicare Risk at Home Any stairs in or around the home?: Yes If so, are there any without handrails?: No Home free of loose throw rugs in walkways, pet beds, electrical cords, etc?: Yes Adequate lighting in your home to reduce risk of falls?: Yes Life alert?: No Use of a cane, walker or w/c?: No Grab bars in the bathroom?: No Shower chair or bench in shower?: No Elevated toilet seat or a handicapped toilet?: No  TIMED UP AND GO:  Was the test performed?  No    Cognitive Function:        06/28/2023    2:36 PM 08/26/2022    1:16 PM  6CIT Screen  What Year? 0 points 0 points  What month? 0 points 0 points  What time? 0 points 0 points  Count back from 20 0 points 0 points  Months in reverse 0 points 4 points  Repeat phrase 0 points 4 points  Total Score 0 points 8 points    Immunizations Immunization History  Administered Date(s) Administered   Fluad Quad(high Dose 65+) 08/01/2019, 09/03/2020, 08/11/2021, 08/17/2022   Influenza Split 08/16/2011, 08/22/2012   Influenza Whole 11/01/2001, 08/16/2007, 08/07/2008, 08/13/2009,  07/23/2010   Influenza, High Dose Seasonal PF 07/28/2015, 09/14/2017, 07/24/2018   Influenza,inj,Quad PF,6+ Mos 08/15/2013, 08/14/2014, 08/01/2019   Influenza-Unspecified 08/02/2016   Moderna Sars-Covid-2 Vaccination 12/15/2019, 01/12/2020   PFIZER Comirnaty(Gray Top)Covid-19 Tri-Sucrose Vaccine 08/05/2022   Pfizer Covid-19 Vaccine Bivalent Booster 74yrs & up 08/11/2021   Pneumococcal Conjugate-13 11/28/2015   Pneumococcal Polysaccharide-23 06/03/2014   Rsv, Bivalent, Protein Subunit Rsvpref,pf Verdis Frederickson) 08/05/2022   Tdap 08/07/2008, 03/01/2011, 12/10/2012   Zoster Recombinant(Shingrix) 04/06/2022, 06/08/2022   Zoster, Live 03/01/2011     TDAP status: Due, Education has been provided regarding the importance of this vaccine. Advised may receive this vaccine at local pharmacy or Health Dept. Aware to provide a copy of the vaccination record if obtained from local pharmacy or Health Dept. Verbalized acceptance and understanding.  Flu Vaccine status: Due, Education has been provided regarding the importance of this vaccine. Advised may receive this vaccine at local pharmacy or Health Dept. Aware to provide a copy of the vaccination record if obtained from local pharmacy or Health Dept. Verbalized acceptance and understanding.  Pneumococcal vaccine status: Up to date  Covid-19 vaccine status: Information provided on how to obtain vaccines.   Qualifies for Shingles Vaccine? Yes   Zostavax completed Yes   Shingrix Completed?: Yes  Screening Tests Health Maintenance  Topic Date Due   COVID-19 Vaccine (5 - 2023-24 season) 09/30/2022   DTaP/Tdap/Td (4 - Td or Tdap) 12/10/2022   INFLUENZA VACCINE  06/02/2023   Medicare Annual Wellness (AWV)  06/27/2024   Colonoscopy  12/28/2026   Pneumonia Vaccine 84+ Years old  Completed   Hepatitis C Screening  Completed   Zoster Vaccines- Shingrix  Completed   HPV VACCINES  Aged Out    Health Maintenance  Health Maintenance Due  Topic Date Due   COVID-19 Vaccine (5 - 2023-24 season) 09/30/2022   DTaP/Tdap/Td (4 - Td or Tdap) 12/10/2022   INFLUENZA VACCINE  06/02/2023    Colorectal cancer screening: Type of screening: Colonoscopy. Completed 12/28/21. Repeat every 5 years   Additional Screening:  Hepatitis C Screening:  Completed 03/10/18  Vision Screening: Recommended annual ophthalmology exams for early detection of glaucoma and other disorders of the eye. Is the patient up to date with their annual eye exam?  Yes  Who is the provider or what is the name of the office in which the patient attends annual eye exams? Dr Randon Goldsmith  If pt is not established with a provider, would they  like to be referred to a provider to establish care? No .   Dental Screening: Recommended annual dental exams for proper oral hygiene    Community Resource Referral / Chronic Care Management: CRR required this visit?  No   CCM required this visit?  No     Plan:     I have personally reviewed and noted the following in the patient's chart:   Medical and social history Use of alcohol, tobacco or illicit drugs  Current medications and supplements including opioid prescriptions. Patient is not currently taking opioid prescriptions. Functional ability and status Nutritional status Physical activity Advanced directives List of other physicians Hospitalizations, surgeries, and ER visits in previous 12 months Vitals Screenings to include cognitive, depression, and falls Referrals and appointments  In addition, I have reviewed and discussed with patient certain preventive protocols, quality metrics, and best practice recommendations. A written personalized care plan for preventive services as well as general preventive health recommendations were provided to patient.     Marzella Schlein, LPN  06/28/2023   After Visit Summary: (MyChart) Due to this being a telephonic visit, the after visit summary with patients personalized plan was offered to patient via MyChart   Nurse Notes: none

## 2023-07-02 DIAGNOSIS — I10 Essential (primary) hypertension: Secondary | ICD-10-CM | POA: Diagnosis not present

## 2023-07-06 ENCOUNTER — Ambulatory Visit: Payer: PPO | Admitting: Physician Assistant

## 2023-07-07 ENCOUNTER — Encounter: Payer: Self-pay | Admitting: Family

## 2023-07-07 ENCOUNTER — Ambulatory Visit (INDEPENDENT_AMBULATORY_CARE_PROVIDER_SITE_OTHER): Payer: PPO | Admitting: Family

## 2023-07-07 VITALS — BP 130/75 | HR 74 | Temp 97.0°F | Ht 68.0 in | Wt 248.0 lb

## 2023-07-07 DIAGNOSIS — R21 Rash and other nonspecific skin eruption: Secondary | ICD-10-CM | POA: Diagnosis not present

## 2023-07-07 DIAGNOSIS — R221 Localized swelling, mass and lump, neck: Secondary | ICD-10-CM | POA: Diagnosis not present

## 2023-07-07 MED ORDER — HYDROXYZINE HCL 10 MG PO TABS: 10.0000 mg | ORAL_TABLET | Freq: Three times a day (TID) | ORAL | 0 refills | Status: DC | PRN

## 2023-07-07 NOTE — Patient Instructions (Addendum)
It was very nice to see you today!   I have sent the order for an ultrasound of your neck. They will call you directly to schedule. I have sent over Hydroxyzine for the itching to your pharmacy. Take as directed and OK to take 2 pills at bedtime to help with sleep.  Ok to keep applying ice for up to 25-30 minutes 3-4 times per day, taking a 1 hour break at least in between applications. Use a thick cream (hypoallergenic or no fragrance) mixed with the Triamcinolone cream you have and apply this to your neck and chest twice a day.  Call back if any worsening of symptoms such as difficulty swallowing or pain.      PLEASE NOTE:  If you had any lab tests please let us know if you have not heard back within a few days. You may see your results on MyChart before we have a chance to review them but we will give you a call once they are reviewed by [place]. If we ordered any referrals today, please let us know if you have not heard from their office within the next week.

## 2023-07-07 NOTE — Progress Notes (Signed)
Patient ID: Shawn Wood, male    DOB: May 28, 1951, 72 y.o.   MRN: [id number]  Chief Complaint  Patient presents with   Neck Pain    Pt c/o neck pain, swelling and itchiness, Present since Tuesday. Has tried ice which did relieve pain slightly.     HPI:      Neck swelling:  pt reports sagging skin from chin to neck, loose, floppy skin. Pt denies any pain, first noticed after using an electric shaver 2 days ago, and also started having a lot of redness, burning and itching and has been scratching the area. He denies using any aftershave cream or other lotions after shaving.  He has tried triamcinolone cream which has not helped, but applying ice to area has helped with the itching & burning.      Assessment & Plan:  1. Skin rash- sending Hydroxyzine for itching, advised on use & SE. Advised to apply mix of triamcinolone cream with home body cream (hypoallergenic or fragrance free) and apply to skin bid. Ok to continue to apply ice for up to 20 min tid.  RTO precautions provided.  - hydrOXYzine (ATARAX) 10 MG tablet; Take 1 tablet (10 mg total) by mouth 3 (three) times daily as needed for itching.  Dispense: 30 tablet; Refill: 0  2. Localized swelling, mass and lump, neck sudden appearing loose, hanging skin, like a jowl notable from under chin down to top of sternoclavicular notch. Unsure if just inflamed from scratching vs enlarged thyroid tissue vs. other etiology. Advised to continue applying ice up to tid for next few days, avoid scratching skin as much as possible.  - US SOFT TISSUE HEAD & NECK (NON-THYROID); Future   Subjective:    Outpatient Medications Prior to Visit  Medication Sig Dispense Refill   aspirin EC 81 MG tablet Take 81 mg by mouth daily.     atorvastatin (LIPITOR) 20 MG tablet Take 1 tablet (20 mg total) by mouth once a week. 13 tablet 3   diclofenac (VOLTAREN) 75 MG EC tablet Take 1 tablet (75 mg total) by mouth 2 (two) times daily. 180 tablet 1   PARoxetine  (PAXIL) 30 MG tablet Take 1 tablet (30 mg total) by mouth daily. 90 tablet 3   SHINGRIX injection      No facility-administered medications prior to visit.   Past Medical History:  Diagnosis Date   COLONIC POLYPS, HX OF 05/25/2007   HYPERGLYCEMIA 08/07/2008   Leg pain right leg   TINEA PEDIS 07/19/2007   Varicose veins    history of cellulitis   Past Surgical History:  Procedure Laterality Date   CATARACT EXTRACTION, BILATERAL     2020   KNEE SURGERY  1999   fracture   VARICOSE VEIN SURGERY     No Known Allergies    Objective:    Physical Exam Vitals and nursing note reviewed.  Constitutional:      General: He is not in acute distress.    Appearance: Normal appearance.  HENT:     Head: Normocephalic.  Neck:     Thyroid: Thyroid mass (a pocket of soft, loose, floppy skin from chin to bottom of neck approx. 4-5cm in width, no firmness, or thyroidmegaly palpable) present.   Cardiovascular:     Rate and Rhythm: Normal rate and regular rhythm.  Pulmonary:     Effort: Pulmonary effort is normal.     Breath sounds: Normal breath sounds.  Musculoskeletal:  General: Normal range of motion.     Cervical back: Normal range of motion.  Skin:    General: Skin is warm and dry.     Findings: Rash (diffuse erythema with a few patchy areas of mild flaky skin, neck from under chin, all of anterior neck and top of chest underneath neck) present.       Neurological:     Mental Status: He is alert and oriented to person, place, and time.  Psychiatric:        Mood and Affect: Mood normal.   BP 130/75 (BP Location: Left Arm, Patient Position: Sitting, Cuff Size: Large)   Pulse 74   Temp (!) 97 F (36.1 C) (Temporal)   Ht 5\' 8"  (1.727 m)   Wt 248 lb (112.5 kg)   SpO2 99%   BMI 37.71 kg/m  Wt Readings from Last 3 Encounters:  07/07/23 248 lb (112.5 kg)  06/28/23 240 lb (108.9 kg)  01/10/23 251 lb (113.9 kg)       Dulce Sellar, NP

## 2023-07-11 ENCOUNTER — Telehealth: Payer: Self-pay | Admitting: Family

## 2023-07-11 ENCOUNTER — Other Ambulatory Visit: Payer: Self-pay | Admitting: Family

## 2023-07-11 DIAGNOSIS — R221 Localized swelling, mass and lump, neck: Secondary | ICD-10-CM

## 2023-07-11 NOTE — Telephone Encounter (Signed)
Left patient vm on both numbers to give me a call back in regard to [place].    Facility on [place] placed by [place] has been updated to [facility].  Patient can call [phone] to get scheduled.

## 2023-07-13 ENCOUNTER — Ambulatory Visit
Admission: RE | Admit: 2023-07-13 | Discharge: 2023-07-13 | Disposition: A | Discharge status: Home / Self Care | Payer: PPO | Source: Ambulatory Visit | Attending: Family | Admitting: Family

## 2023-07-13 DIAGNOSIS — R221 Localized swelling, mass and lump, neck: Secondary | ICD-10-CM

## 2023-07-16 ENCOUNTER — Encounter: Payer: Self-pay | Admitting: Family Medicine

## 2023-08-01 DIAGNOSIS — I10 Essential (primary) hypertension: Secondary | ICD-10-CM | POA: Diagnosis not present

## 2023-08-08 ENCOUNTER — Other Ambulatory Visit: Payer: Self-pay | Admitting: Family Medicine

## 2023-09-01 DIAGNOSIS — I10 Essential (primary) hypertension: Secondary | ICD-10-CM | POA: Diagnosis not present

## 2023-09-06 ENCOUNTER — Ambulatory Visit: Payer: PPO | Admitting: Family Medicine

## 2023-09-26 ENCOUNTER — Ambulatory Visit (INDEPENDENT_AMBULATORY_CARE_PROVIDER_SITE_OTHER): Payer: PPO | Admitting: Family Medicine

## 2023-09-26 ENCOUNTER — Encounter: Payer: Self-pay | Admitting: Family Medicine

## 2023-09-26 VITALS — BP 115/69 | HR 79 | Temp 98.0°F | Ht 68.0 in | Wt 248.2 lb

## 2023-09-26 DIAGNOSIS — L0390 Cellulitis, unspecified: Secondary | ICD-10-CM | POA: Diagnosis not present

## 2023-09-26 MED ORDER — TRIAMCINOLONE ACETONIDE 0.5 % EX OINT: 1.0000 | TOPICAL_OINTMENT | Freq: Two times a day (BID) | CUTANEOUS | 0 refills | Status: DC

## 2023-09-26 MED ORDER — AMOXICILLIN 875 MG PO TABS: 875.0000 mg | ORAL_TABLET | Freq: Two times a day (BID) | ORAL | 0 refills | Status: DC

## 2023-09-26 MED ORDER — DOXYCYCLINE HYCLATE 100 MG PO TABS: 100.0000 mg | ORAL_TABLET | Freq: Two times a day (BID) | ORAL | 0 refills | Status: DC

## 2023-09-26 NOTE — Progress Notes (Signed)
   Shawn Wood is a 72 y.o. male who presents today for an office visit.  Assessment/Plan:  Cellulitis Rash on left arm likely initially started as a patch of xerosis cutis or eczema however due to scratching he has developed a fairly large cellulitis.  No red flags or signs of systemic illness.  He is having some drainage.  Will cover for MSSA and MRSA with combination of amoxicillin and doxycycline.  Encouraged hydration.  He will let us know if not improving the next few days.  We discussed reasons to return to care and seek emergent care.  Dry Skin  Chronically recurring dry flaky skin on bilateral palms.  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  This is likely contributing to his above for cellulitis due to frequent scratching and excoriation.  Will start topical triamcinolone.  May need higher potency if he does not respond well to this.  He will continue with once or twice daily sensitive skin emollients.  He can discuss this further with PCP.  We discussed reasons to return to care.       Subjective:  HPI:  Patient here with rash on his left arm.  This started about a week ago.  Initially there is a small dry patch.  Was very itchy.  He was started scratching the area.  Shortly afterwards noticed more redness.  This led to more itching.  He subsequently started scratching more.  This cycle has continued to his present state.  Now most of his left arm is red and irritated.  Tried using lotion without much improvement.  No fevers or chills.  No myalgias.  No body aches.  He does have a history of dry flaky skin.  He will sometimes notice the skin on his palms toeing off.       Objective:  Physical Exam: BP 115/69   Pulse 79   Temp 98 F (36.7 C) (Temporal)   Ht 5\' 8"  (1.727 m)   Wt 248 lb 3.2 oz (112.6 kg)   SpO2 96%   BMI 37.74 kg/m   Gen: No acute distress, resting comfortably Skin: Significant dry skin with flaking noted at bilateral hands and arms.  Large confluent erythematous rash  involving most of the diameter of his left lower arm with central open excoriation.  see below picture.  Neurovascular intact distally. Neuro: Grossly normal, moves all extremities Psych: Normal affect and thought content        Shawn Marland M. Jimmey Ralph, MD 09/26/2023 2:02 PM

## 2023-09-26 NOTE — Patient Instructions (Signed)
It was very nice to see you today!  I think you have an infection in your arm. Please start the antibiotics.  Use the triamcinolone on the dry and flaky areas.  Let us know if symptoms worsen or do not improve.  Return if symptoms worsen or fail to improve.   Take care, Dr Jimmey Ralph  PLEASE NOTE:  If you had any lab tests, please let us know if you have not heard back within a few days. You may see your results on mychart before we have a chance to review them but we will give you a call once they are reviewed by [place].   If we ordered any referrals today, please let us know if you have not heard from their office within the next week.   If you had any urgent prescriptions sent in today, please check with the pharmacy within an hour of our visit to make sure the prescription was transmitted appropriately.   Please try these tips to maintain a healthy lifestyle:  Eat at least 3 REAL meals and 1-2 snacks per day.  Aim for no more than 5 hours between eating.  If you eat breakfast, please do so within one hour of getting up.   Each meal should contain half fruits/vegetables, one quarter protein, and one quarter carbs (no bigger than a computer mouse)  Cut down on sweet beverages. This includes juice, soda, and sweet tea.   Drink at least 1 glass of water with each meal and aim for at least 8 glasses per day  Exercise at least 150 minutes every week.

## 2023-10-01 DIAGNOSIS — I10 Essential (primary) hypertension: Secondary | ICD-10-CM | POA: Diagnosis not present

## 2023-10-08 ENCOUNTER — Other Ambulatory Visit: Payer: Self-pay | Admitting: Family Medicine

## 2023-11-01 DIAGNOSIS — I10 Essential (primary) hypertension: Secondary | ICD-10-CM | POA: Diagnosis not present

## 2023-11-09 DIAGNOSIS — K080 Exfoliation of teeth due to systemic causes: Secondary | ICD-10-CM | POA: Diagnosis not present

## 2023-12-30 DIAGNOSIS — I10 Essential (primary) hypertension: Secondary | ICD-10-CM | POA: Diagnosis not present

## 2024-01-23 ENCOUNTER — Other Ambulatory Visit: Payer: Self-pay | Admitting: Family Medicine

## 2024-01-23 MED ORDER — ATORVASTATIN CALCIUM 20 MG PO TABS: 20.0000 mg | ORAL_TABLET | ORAL | 3 refills | Status: DC

## 2024-01-27 ENCOUNTER — Encounter: Payer: Self-pay | Admitting: Family Medicine

## 2024-01-30 DIAGNOSIS — I10 Essential (primary) hypertension: Secondary | ICD-10-CM | POA: Diagnosis not present

## 2024-01-31 ENCOUNTER — Ambulatory Visit: Admitting: Family Medicine

## 2024-02-17 ENCOUNTER — Other Ambulatory Visit: Payer: Self-pay | Admitting: Family Medicine

## 2024-02-21 ENCOUNTER — Encounter: Payer: Self-pay | Admitting: Family Medicine

## 2024-02-21 ENCOUNTER — Other Ambulatory Visit: Payer: Self-pay

## 2024-02-21 MED ORDER — DICLOFENAC SODIUM 75 MG PO TBEC: 75.0000 mg | DELAYED_RELEASE_TABLET | Freq: Two times a day (BID) | ORAL | 0 refills | Status: DC

## 2024-02-21 MED ORDER — DICLOFENAC SODIUM 75 MG PO TBEC: 75.0000 mg | DELAYED_RELEASE_TABLET | Freq: Two times a day (BID) | ORAL | 3 refills | Status: AC

## 2024-02-29 DIAGNOSIS — I10 Essential (primary) hypertension: Secondary | ICD-10-CM | POA: Diagnosis not present

## 2024-03-14 DIAGNOSIS — I10 Essential (primary) hypertension: Secondary | ICD-10-CM | POA: Diagnosis not present

## 2024-03-14 DIAGNOSIS — E785 Hyperlipidemia, unspecified: Secondary | ICD-10-CM | POA: Diagnosis not present

## 2024-03-20 ENCOUNTER — Other Ambulatory Visit: Payer: Self-pay | Admitting: Family Medicine

## 2024-03-27 DIAGNOSIS — K080 Exfoliation of teeth due to systemic causes: Secondary | ICD-10-CM | POA: Diagnosis not present

## 2024-07-03 ENCOUNTER — Ambulatory Visit (INDEPENDENT_AMBULATORY_CARE_PROVIDER_SITE_OTHER): Payer: PPO

## 2024-07-03 VITALS — Ht 68.5 in | Wt 235.0 lb

## 2024-07-03 DIAGNOSIS — Z0000 Encounter for general adult medical examination without abnormal findings: Secondary | ICD-10-CM

## 2024-07-03 NOTE — Progress Notes (Signed)
 Subjective:   Shawn Wood is a 73 y.o. who presents for a Medicare Wellness preventive visit.  As a reminder, Annual Wellness Visits don't include a physical exam, and some assessments may be limited, especially if this visit is performed virtually. We may recommend an in-person follow-up visit with your provider if needed.  Visit Complete: Virtual I connected with  [name] on 07/03/24 by a audio enabled telemedicine application and verified that I am speaking with the correct person using two identifiers.  Patient Location: Home  Provider Location: Office/Clinic  I discussed the limitations of evaluation and management by telemedicine. The patient expressed understanding and agreed to proceed.  Vital Signs: Because this visit was a virtual/telehealth visit, some criteria may be missing or patient reported. Any vitals not documented were not able to be obtained and vitals that have been documented are patient reported.  VideoDeclined- This patient declined Librarian, academic. Therefore the visit was completed with audio only.  Persons Participating in Visit: wife [name] for pt was under the weather   AWV Questionnaire: No: Patient Medicare AWV questionnaire was not completed prior to this visit.  Cardiac Risk Factors include: advanced age (>80men, >73 women);dyslipidemia;obesity (BMI >30kg/m2);male gender;hypertension     Objective:    Today's Vitals   07/03/24 1435  Weight: 235 lb (106.6 kg)  Height: 5' 8.5 (1.74 m)   Body mass index is 35.21 kg/m.     07/03/2024    2:38 PM 06/28/2023    2:34 PM 08/26/2022    1:14 PM 06/11/2017   12:02 PM 03/03/2013   12:39 PM 03/03/2013   12:36 PM  Advanced Directives  Does Patient Have a Medical Advance Directive? Yes Yes Yes Yes  Patient has advance directive, copy not in chart  Patient has advance directive, copy not in chart   Type of Advance Directive Healthcare Power of [place];Living will Healthcare  Power of [place];Living will Healthcare Power of [place];Living will Living will Living will  Living will   Copy of Healthcare Power of Attorney in Chart? No - copy requested No - copy requested No - copy requested     Pre-existing out of facility DNR order (yellow form or pink MOST form)     No  No      Data saved with a previous flowsheet row definition    Current Medications (verified) Outpatient Encounter Medications as of 07/03/2024  Medication Sig   aspirin EC 81 MG tablet Take 81 mg by mouth daily.   atorvastatin  (LIPITOR) 20 MG tablet Take 1 tablet (20 mg total) by mouth once a week.   diclofenac  (VOLTAREN ) 75 MG EC tablet Take 1 tablet (75 mg total) by mouth 2 (two) times daily.   PARoxetine  (PAXIL ) 30 MG tablet TAKE 1 TABLET BY MOUTH EVERY DAY   [DISCONTINUED] amoxicillin  (AMOXIL ) 875 MG tablet Take 1 tablet (875 mg total) by mouth 2 (two) times daily.   [DISCONTINUED] doxycycline  (VIBRA -TABS) 100 MG tablet Take 1 tablet (100 mg total) by mouth 2 (two) times daily.   [DISCONTINUED] hydrOXYzine  (ATARAX ) 10 MG tablet Take 1 tablet (10 mg total) by mouth 3 (three) times daily as needed for itching.   [DISCONTINUED] triamcinolone  ointment (KENALOG ) 0.5 % Apply 1 Application topically 2 (two) times daily.   No facility-administered encounter medications on file as of 07/03/2024.    Allergies (verified) Patient has no known allergies.   History: Past Medical History:  Diagnosis Date   COLONIC POLYPS, HX OF 05/25/2007  HYPERGLYCEMIA 08/07/2008   Leg pain right leg   TINEA PEDIS 07/19/2007   Varicose veins    history of cellulitis   Past Surgical History:  Procedure Laterality Date   CATARACT EXTRACTION, BILATERAL     2020   KNEE SURGERY  1999   fracture   VARICOSE VEIN SURGERY     Family History  Problem Relation Age of Onset   Breast cancer Mother    Prostate cancer Father        late 48s   Colon cancer Father        late 57s, did not have colonscopies   Brain cancer  Sister    Seizures Son    Social History   Socioeconomic History   Marital status: Married    Spouse name: Not on file   Number of children: Not on file   Years of education: Not on file   Highest education level: Not on file  Occupational History   Not on file  Tobacco Use   Smoking status: Never   Smokeless tobacco: Never  Vaping Use   Vaping status: Not on file  Substance and Sexual Activity   Alcohol use: Not Currently    Comment: occcassinal beer   Drug use: No   Sexual activity: Yes  Other Topics Concern   Not on file  Social History Narrative   Married 41 years (wife Charity fundraiser at [employer]) 2015 with 2 sons. 1 son with epilepsy stays at home and still has seizures. No grandkids.       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       Social Drivers of [employer]: Low Risk  (07/03/2024)   Overall Financial Resource Strain (CARDIA)    Difficulty of Paying Living Expenses: Not hard at all  Food Insecurity: No Food Insecurity (07/03/2024)   Hunger Vital Sign    Worried About Running Out of Food in the Last Year: Never true    Ran Out of Food in the Last Year: Never true  Transportation Needs: No Transportation Needs (07/03/2024)   PRAPARE - Administrator, Civil Service (Medical): No    Lack of Transportation (Non-Medical): No  Physical Activity: Sufficiently Active (07/03/2024)   Exercise Vital Sign    Days of Exercise per Week: 7 days    Minutes of Exercise per Session: 50 min  Stress: No Stress Concern Present (07/03/2024)   [employer] of Occupational Health - Occupational Stress Questionnaire    Feeling of Stress: Not at all  Social Connections: Moderately Integrated (07/03/2024)   Social  Connection and Isolation Panel    Frequency of Communication with Friends and Family: More than three times a week    Frequency of Social Gatherings with Friends and Family: More than three times a week    Attends Religious Services: More than 4 times per year    Active Member of [employer] or Organizations: No    Attends Banker Meetings: Never    Marital Status: Married    Tobacco Counseling Counseling given: Not Answered    Clinical Intake:  Pre-visit preparation completed: Yes  Pain : No/denies pain     BMI - recorded: 35.21  Nutritional Status: BMI > 30  Obese Nutritional Risks: None Diabetes: No  Lab Results  Component Value Date   HGBA1C 6.3 12/15/2021   HGBA1C 6.2 (H) 06/11/2020   HGBA1C 6.3 12/12/2019     How often do you need to have someone help you when you read instructions, pamphlets, or other written materials from your doctor or pharmacy?: 1 - Never  Interpreter Needed?: No  Information entered by :: Ellouise Haws, LPN   Activities of Daily Living     07/03/2024    2:40 PM  In your present state of health, do you have any difficulty performing the following activities:  Hearing? 0  Vision? 0  Difficulty concentrating or making decisions? 0  Walking or climbing stairs? 0  Dressing or bathing? 0  Doing errands, shopping? 0  Preparing Food and eating ? N  Using the Toilet? N  In the past six months, have you accidently leaked urine? N  Do you have problems with loss of bowel control? N  Managing your Medications? N  Managing your Finances? N  Housekeeping or managing your Housekeeping? N    Patient Care Team: Katrinka Garnette KIDD, MD as PCP - General (Family Medicine)  I have updated your Care Teams any recent Medical Services you may have received from other providers in the past year.     Assessment:   This is a routine wellness examination for Ramal.  Hearing/Vision screen Hearing Screening - Comments:: Pt denies any hearing  issues  Vision Screening - Comments:: Wears rx glasses - up to date with routine eye exams with Dr Charmayne Morita ophthalmology    Goals Addressed             This Visit's Progress    Patient Stated       Weight loss        Depression Screen     07/03/2024    2:39 PM 09/26/2023    1:26 PM 06/28/2023    2:34 PM 01/10/2023    1:07 PM 08/26/2022    1:13 PM 02/22/2022    3:11 PM 12/15/2021    8:12 AM  PHQ 2/9 Scores  PHQ - 2 Score 0 0 0 0 0 0 0  PHQ- 9 Score    0   0    Fall Risk     07/03/2024    2:41 PM 09/26/2023    1:26 PM 06/28/2023    2:36 PM 08/26/2022    1:15 PM 02/22/2022    3:11 PM  Fall Risk   Falls in the past year? 0 0 0 0 0  Number falls in past yr: 0 0 0 0 0  Injury with Fall? 0 0 0 0 0  Risk for fall due to : No Fall Risks No Fall Risks Impaired vision Impaired vision No Fall Risks  Follow up Falls prevention discussed  Falls prevention discussed Falls prevention discussed       Data saved with a previous flowsheet row definition    MEDICARE RISK AT HOME:  Medicare Risk at Home Any stairs in or around the home?: No If so, are there any without handrails?: No Home free of loose throw rugs in walkways, pet beds, electrical cords, etc?: Yes Adequate lighting in your home to reduce risk of falls?: Yes Life alert?: No Use of a cane, walker or w/c?: No Grab bars in the bathroom?: No Shower chair or bench in shower?: No Elevated toilet seat or a handicapped toilet?: No  TIMED UP AND GO:  Was the test performed?  No  Cognitive Function: Declined/Normal: No cognitive concerns noted by patient or family. Patient alert, oriented, able to answer questions appropriately and recall recent events. No signs of memory loss or confusion.    07/03/2024    2:41 PM  MMSE - Mini Mental State Exam  Not completed: Refused        06/28/2023    2:36 PM 08/26/2022    1:16 PM  6CIT Screen  What Year? 0 points 0 points  What month? 0 points 0 points  What time? 0  points 0 points  Count back from 20 0 points 0 points  Months in reverse 0 points 4 points  Repeat phrase 0 points 4 points  Total Score 0 points 8 points    Immunizations Immunization History  Administered Date(s) Administered    sv, Bivalent, Protein Subunit Rsvpref,pf (Abrysvo) 08/05/2022   Fluad Quad(high Dose 65+) 08/01/2019, 09/03/2020, 08/11/2021, 08/17/2022   INFLUENZA, HIGH DOSE SEASONAL PF 07/28/2015, 09/14/2017, 07/24/2018, 07/15/2023   Influenza Split 08/16/2011, 08/22/2012   Influenza Whole 11/01/2001, 08/16/2007, 08/07/2008, 08/13/2009, 07/23/2010   Influenza,inj,Quad PF,6+ Mos 08/15/2013, 08/14/2014, 08/01/2019   Influenza-Unspecified 08/02/2016   Moderna Covid-19 Fall Seasonal Vaccine 35yrs & older 07/15/2023   Moderna Sars-Covid-2 Vaccination 12/15/2019, 01/12/2020, 08/09/2020   PFIZER Comirnaty(Gray Top)Covid-19 Tri-Sucrose Vaccine 08/05/2022   Pfizer Covid-19 Vaccine Bivalent Booster 50yrs & up 08/10/2021, 08/11/2021   Pneumococcal Conjugate-13 11/28/2015   Pneumococcal Polysaccharide-23 06/03/2014   Tdap 08/07/2008, 03/01/2011, 12/10/2012, 07/15/2023   Zoster Recombinant(Shingrix) 04/06/2022, 06/08/2022   Zoster, Live 03/01/2011    Screening Tests Health Maintenance  Topic Date Due   INFLUENZA VACCINE  06/01/2024   COVID-19 Vaccine (8 - 2024-25 season) 07/02/2024   Medicare Annual Wellness (AWV)  07/03/2025   Colonoscopy  12/28/2026   DTaP/Tdap/Td (5 - Td or Tdap) 07/14/2033   Pneumococcal Vaccine: 50+ Years  Completed   Hepatitis C Screening  Completed   Zoster Vaccines- Shingrix  Completed   HPV VACCINES  Aged Out   Meningococcal B Vaccine  Aged Out    Health Maintenance  Health Maintenance Due  Topic Date Due   INFLUENZA VACCINE  06/01/2024   COVID-19 Vaccine (8 - 2024-25 season) 07/02/2024   Health Maintenance Items Addressed: See Nurse Notes at the end of this note  Additional Screening:  Vision Screening: Recommended annual  ophthalmology exams for early detection of glaucoma and other disorders of the eye. Would you like a referral to an eye doctor? No    Dental Screening: Recommended annual dental exams for proper oral hygiene  Community Resource Referral / Chronic Care Management: CRR required this visit?  No   CCM required this visit?  No   Plan:    I have personally reviewed and noted the following in the patient's chart:   Medical and social history Use of alcohol, tobacco or illicit drugs  Current medications and supplements including opioid prescriptions. Patient is not currently taking opioid prescriptions. Functional ability and status Nutritional status Physical activity Advanced directives List of other physicians Hospitalizations, surgeries, and ER visits in previous 12 months Vitals Screenings to include cognitive, depression, and falls Referrals and appointments  In addition, I have reviewed and discussed with patient certain preventive protocols, quality metrics, and best practice recommendations. A written personalized care plan for preventive services as well as general preventive health recommendations were provided to patient.   Ellouise VEAR Haws, LPN   [id number]   After Visit Summary: (MyChart)  Due to this being a telephonic visit, the after visit summary with patients personalized plan was offered to patient via MyChart   Notes: Nothing significant to report at this time. Flu vaccine scheduled 07/16/24

## 2024-07-09 ENCOUNTER — Encounter: Payer: Self-pay | Admitting: Family Medicine

## 2024-07-23 ENCOUNTER — Encounter: Payer: Self-pay | Admitting: Family Medicine

## 2024-08-06 DIAGNOSIS — M25561 Pain in right knee: Secondary | ICD-10-CM | POA: Diagnosis not present

## 2024-08-06 DIAGNOSIS — M25551 Pain in right hip: Secondary | ICD-10-CM | POA: Diagnosis not present

## 2024-08-09 ENCOUNTER — Telehealth: Payer: Self-pay | Admitting: Family Medicine

## 2024-08-09 ENCOUNTER — Encounter: Payer: Self-pay | Admitting: Family Medicine

## 2024-08-09 DIAGNOSIS — R739 Hyperglycemia, unspecified: Secondary | ICD-10-CM

## 2024-08-09 DIAGNOSIS — Z131 Encounter for screening for diabetes mellitus: Secondary | ICD-10-CM

## 2024-08-09 DIAGNOSIS — E785 Hyperlipidemia, unspecified: Secondary | ICD-10-CM

## 2024-08-09 NOTE — Telephone Encounter (Signed)
 Which labs would you like to order?

## 2024-08-09 NOTE — Telephone Encounter (Signed)
 Noted. This is being handled under a separate encounter.  Copied from CRM [id number]. Topic: Clinical - Request for Lab/Test Order >> Aug 09, 2024  4:08 PM Shawn Wood wrote: Reason for CRM: Patient would like to know if order could be placed for him to come in a few days before his physical on 10/23 at 120,since he has to fast for the blood work?

## 2024-08-20 ENCOUNTER — Ambulatory Visit: Payer: Self-pay | Admitting: Family Medicine

## 2024-08-20 ENCOUNTER — Other Ambulatory Visit (INDEPENDENT_AMBULATORY_CARE_PROVIDER_SITE_OTHER)

## 2024-08-20 DIAGNOSIS — R739 Hyperglycemia, unspecified: Secondary | ICD-10-CM | POA: Diagnosis not present

## 2024-08-20 DIAGNOSIS — Z131 Encounter for screening for diabetes mellitus: Secondary | ICD-10-CM

## 2024-08-20 DIAGNOSIS — E785 Hyperlipidemia, unspecified: Secondary | ICD-10-CM

## 2024-08-20 LAB — CBC WITH DIFFERENTIAL/PLATELET
Basophils Absolute: 0 K/uL (ref 0.0–0.1)
Basophils Relative: 0.8 % (ref 0.0–3.0)
Eosinophils Absolute: 0.2 K/uL (ref 0.0–0.7)
Eosinophils Relative: 4.6 % (ref 0.0–5.0)
HCT: 45.7 % (ref 39.0–52.0)
Hemoglobin: 15.2 g/dL (ref 13.0–17.0)
Lymphocytes Relative: 27.9 % (ref 12.0–46.0)
Lymphs Abs: 1.5 K/uL (ref 0.7–4.0)
MCHC: 33.3 g/dL (ref 30.0–36.0)
MCV: 90.7 fl (ref 78.0–100.0)
Monocytes Absolute: 0.3 K/uL (ref 0.1–1.0)
Monocytes Relative: 6.1 % (ref 3.0–12.0)
Neutro Abs: 3.2 K/uL (ref 1.4–7.7)
Neutrophils Relative %: 60.6 % (ref 43.0–77.0)
Platelets: 240 K/uL (ref 150.0–400.0)
RBC: 5.03 Mil/uL (ref 4.22–5.81)
RDW: 13.7 % (ref 11.5–15.5)
WBC: 5.3 K/uL (ref 4.0–10.5)

## 2024-08-20 LAB — LIPID PANEL
Cholesterol: 170 mg/dL (ref 0–200)
HDL: 55.1 mg/dL (ref >39.00)
LDL Cholesterol: 98 mg/dL (ref 0–99)
NonHDL: 115.34
Total CHOL/HDL Ratio: 3
Triglycerides: 85 mg/dL (ref 0.0–149.0)
VLDL: 17 mg/dL (ref 0.0–40.0)

## 2024-08-20 LAB — COMPREHENSIVE METABOLIC PANEL WITH GFR
ALT: 13 U/L (ref 0–53)
AST: 17 U/L (ref 0–37)
Albumin: 4 g/dL (ref 3.5–5.2)
Alkaline Phosphatase: 59 U/L (ref 39–117)
BUN: 23 mg/dL (ref 6–23)
CO2: 28 meq/L (ref 19–32)
Calcium: 8.8 mg/dL (ref 8.4–10.5)
Chloride: 103 meq/L (ref 96–112)
Creatinine, Ser: 0.95 mg/dL (ref 0.40–1.50)
GFR: 78.38 mL/min (ref >60.00)
Glucose, Bld: 111 mg/dL — ABNORMAL HIGH (ref 70–99)
Potassium: 4.2 meq/L (ref 3.5–5.1)
Sodium: 139 meq/L (ref 135–145)
Total Bilirubin: 0.7 mg/dL (ref 0.2–1.2)
Total Protein: 6.6 g/dL (ref 6.0–8.3)

## 2024-08-20 LAB — HEMOGLOBIN A1C: Hgb A1c MFr Bld: 6.4 % (ref 4.6–6.5)

## 2024-08-23 ENCOUNTER — Encounter: Payer: Self-pay | Admitting: Family Medicine

## 2024-08-23 ENCOUNTER — Ambulatory Visit: Admitting: Family Medicine

## 2024-08-23 VITALS — BP 102/68 | HR 67 | Temp 98.0°F | Ht 68.5 in | Wt 235.8 lb

## 2024-08-23 DIAGNOSIS — I10 Essential (primary) hypertension: Secondary | ICD-10-CM

## 2024-08-23 DIAGNOSIS — Z0000 Encounter for general adult medical examination without abnormal findings: Secondary | ICD-10-CM | POA: Diagnosis not present

## 2024-08-23 DIAGNOSIS — E785 Hyperlipidemia, unspecified: Secondary | ICD-10-CM

## 2024-08-23 DIAGNOSIS — R7303 Prediabetes: Secondary | ICD-10-CM

## 2024-08-23 NOTE — Patient Instructions (Addendum)
 Thanks for already doing labs  Sounds like you have some good opportunities to lower diabetes risk with reducing little debbies, picking better peanut butter and cutting honey from it, sweet tea reduction, etc.   Consider CT calcium  scoring for $150 with your wife to see if there is some plaque we can see  Recommended follow up: Return in about 1 year (around 08/23/2025) for physical or sooner if needed.Schedule b4 you leave.

## 2024-08-23 NOTE — Progress Notes (Signed)
 Phone: [phone]   Subjective:  Patient presents today for their annual physical. Chief complaint-noted.   See problem oriented charting- ROS- full  review of systems was completed and negative  except for topics noted under acute/chronic concerns  The following were reviewed and entered/updated in epic: Past Medical History:  Diagnosis Date   COLONIC POLYPS, HX OF 05/25/2007   HYPERGLYCEMIA 08/07/2008   Leg pain right leg   TINEA PEDIS 07/19/2007   Varicose veins    history of cellulitis   Patient Active Problem List   Diagnosis Date Noted   Essential hypertension 03/02/2022    Priority: Medium    Bilateral primary osteoarthritis of knee 02/01/2017    Priority: Medium    Atypical chest pain 11/07/2014    Priority: Medium    Hyperlipidemia, unspecified 06/04/2014    Priority: Medium    Depression 06/03/2014    Priority: Medium    Morbid obesity (HCC) 06/03/2014    Priority: Medium    Hyperglycemia 04/25/2013    Priority: Medium    CELLULITIS, LEG, LEFT 11/06/2009    Priority: Medium    Unilateral primary osteoarthritis, right knee 11/24/2016    Priority: Low   Hematuria 06/02/2014    Priority: Low   Traumatic compression fracture of thoracic vertebra (HCC) 03/04/2013    Priority: Low    Class: Acute   Fracture of multiple ribs 03/04/2013    Priority: Low    Class: Acute   Closed right scapular fracture 03/03/2013    Priority: Low    Class: Acute   History of colonic polyps 05/25/2007    Priority: Low   Past Surgical History:  Procedure Laterality Date   CATARACT EXTRACTION, BILATERAL     2020   KNEE SURGERY  1999   fracture   VARICOSE VEIN SURGERY      Family History  Problem Relation Age of Onset   Breast cancer Mother    Prostate cancer Father        late 67s   Colon cancer Father        late 31s, did not have colonscopies   Brain cancer Sister    Seizures Son     Medications- reviewed and updated Current Outpatient Medications   Medication Sig Dispense Refill   aspirin EC 81 MG tablet Take 81 mg by mouth daily.     diclofenac  (VOLTAREN ) 75 MG EC tablet Take 1 tablet (75 mg total) by mouth 2 (two) times daily. 180 tablet 3   PARoxetine  (PAXIL ) 30 MG tablet TAKE 1 TABLET BY MOUTH EVERY DAY 90 tablet 1   No current facility-administered medications for this visit.    Allergies-reviewed and updated No Known Allergies  Social History   Social History Narrative   Married 41 years (wife Charity fundraiser at [facility]) 2015 with 2 sons. 1 son with epilepsy stays at home and still has seizures. No grandkids.       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       Objective  Objective:  BP 102/68 (BP Location: Left Arm, Patient Position: Sitting, Cuff Size: Normal)   Pulse 67   Temp 98 F (36.7 C) (Temporal)   Ht 5' 8.5 (1.74 m)   Wt 235  lb 12.8 oz (107 kg)   SpO2 97%   BMI 35.33 kg/m  Gen: NAD, resting comfortably HEENT: Mucous membranes are moist. Oropharynx normal. Cerumen in both ears but no hearing issues Neck: no thyromegaly CV: RRR no murmurs rubs or gallops Lungs: CTAB no crackles, wheeze, rhonchi Abdomen: soft/nontender/nondistended/normal bowel sounds. No rebound or guarding.  Ext: no edema Skin: warm, dry Neuro: grossly normal, moves all extremities, PERRLA    Assessment and Plan  73 y.o. male presenting for annual physical.  Health Maintenance counseling: 1. Anticipatory guidance: Patient counseled regarding regular dental exams -q6 months, eye exams -yearly,  avoiding smoking and second hand smoke , limiting alcohol to 2 beverages per day - none, no illicit drugs .   2. Risk factor reduction:  Advised patient of need for regular exercise and diet rich and fruits  and vegetables to reduce risk of heart attack and stroke.  Exercise- walking daily for 45 minutes.  Diet/weight management-currently 2 cups creamer - splenda and powder creamer then little debbie cake in am and one at night (low hanging fruit to cut to reduce prediabetes risk), sandwich for lunch- banana and peanut butter. Does a lot of peanut butter with honey - could cut the honey- more traditional dinner with a  vegetables,   Morbid obesity noted with BMI over 35 with prediabetes, hypertension, hyperlipidemia. Has done a great job obver last 4 years of gradually reducing weight from 263- without this may have diabetes by now Wt Readings from Last 3 Encounters:  08/23/24 235 lb 12.8 oz (107 kg)  07/03/24 235 lb (106.6 kg)  09/26/23 248 lb 3.2 oz (112.6 kg)   3. Immunizations/screenings/ancillary studies- up to date  Immunization History  Administered Date(s) Administered    sv, Bivalent, Protein Subunit Rsvpref,pf (Abrysvo) 08/05/2022   Fluad Quad(high Dose 65+) 08/01/2019, 09/03/2020, 08/11/2021, 08/17/2022, 08/02/2024   INFLUENZA, HIGH DOSE SEASONAL PF 07/28/2015, 09/14/2017, 07/24/2018, 07/15/2023   Influenza Split 08/16/2011, 08/22/2012   Influenza Whole 11/01/2001, 08/16/2007, 08/07/2008, 08/13/2009, 07/23/2010   Influenza,inj,Quad PF,6+ Mos 08/15/2013, 08/14/2014, 08/01/2019   Influenza-Unspecified 08/02/2016   Moderna Covid-19 Fall Seasonal Vaccine 33yrs & older 07/15/2023   Moderna Sars-Covid-2 Vaccination 12/15/2019, 01/12/2020, 08/09/2020   PFIZER Comirnaty(Gray Top)Covid-19 Tri-Sucrose Vaccine 08/05/2022   Pfizer Covid-19 Vaccine Bivalent Booster 53yrs & up 08/10/2021, 08/11/2021   Pneumococcal Conjugate-13 11/28/2015   Pneumococcal Polysaccharide-23 06/03/2014   Tdap 08/07/2008, 03/01/2011, 12/10/2012, 07/15/2023   Unspecified SARS-COV-2 Vaccination 08/02/2024   Zoster Recombinant(Shingrix) 04/06/2022, 06/08/2022   Zoster, Live 03/01/2011   4. Prostate cancer screening- past  age based screening recommendations   Lab Results  Component Value Date   PSA 0.3 06/11/2020   PSA 0.48 06/11/2019   PSA 0.67 03/10/2018   5. Colon cancer screening - 12/28/21 with 5 year repeat 6. Skin cancer screening- only if needed. advised regular sunscreen use. Denies worrisome, changing, or new skin lesions.  7. Smoking associated screening (lung cancer screening, AAA screen 65-75, UA)- never smoker 8. STD screening - only active with wife  Status of chronic or acute concerns   # Depression S: Medication:Paxil  30 mg daily (was 40 mg in the past)     08/23/2024    1:20 PM 07/03/2024    2:39 PM 09/26/2023    1:26 PM  Depression screen PHQ 2/9  Decreased Interest 0 0 0  Down, Depressed, Hopeless 0 0 0  PHQ - 2 Score 0 0 0  Altered sleeping 0    Tired, decreased energy 0  Change in appetite 0    Feeling bad or failure about yourself  0    Trouble concentrating 0    Moving slowly or fidgety/restless 0    Suicidal thoughts 0    PHQ-9 Score 0    Difficult doing work/chores Not difficult at all    A/P: full remission- continue current medications    #hyperlipidemia S: Medication: none- atorvastatin  prescribed but never taking Lab Results  Component Value Date   CHOL 170 08/20/2024   HDL 55.10 08/20/2024   LDLCALC 98 08/20/2024   TRIG 85.0 08/20/2024   CHOLHDL 3 08/20/2024  A/P: above goal. Wants to hold off on CT calcium  scoring- no atheosclerosis noted on 2014 scans though including CT chest The 10-year ASCVD risk score (Arnett DK, et al., 2019) is: 16%    #Knee osteoarthritis S: knee injection a while back and helpful on the right. Not needing diclofenac  as much A/P: glad he's doing reasonably well with osteoarthritis - continue to monitor and minimize diclofenac  if possible   # Hyperglycemia/insulin resistance/prediabetes- a1c as high as 6.3  S:  Medication: none Exercise and diet- walking- slight weight loss ongoing- intentional but has further tricks Lab  Results  Component Value Date   HGBA1C 6.4 08/20/2024   HGBA1C 6.3 12/15/2021   HGBA1C 6.2 (H) 06/11/2020  A/P: prediabetes noted- continue to monitor   # Elevated blood pressure in 2023 and we planned to start amlodipine  but he made dietary changes and thankfully numbers improved- glad he's doing well   Recommended follow up: No follow-ups on file. Future Appointments  Date Time Provider Department Center  07/08/2025  1:00 PM [facility] ANNUAL WELLNESS VISIT 1 [facility]   Lab/Order associations:already had fasting labs   ICD-10-CM   1. Preventative health care  Z00.00     2. Morbid obesity (HCC)  E66.01     3. Essential hypertension  I10     4. Hyperlipidemia, unspecified hyperlipidemia type  E78.5     5. Prediabetes  R73.03       No orders of the defined types were placed in this encounter.   Return precautions advised.  Garnette Lukes, MD

## 2024-09-05 ENCOUNTER — Ambulatory Visit: Admitting: Orthopaedic Surgery

## 2024-09-18 ENCOUNTER — Other Ambulatory Visit: Payer: Self-pay | Admitting: Family Medicine

## 2024-09-28 ENCOUNTER — Other Ambulatory Visit: Payer: Self-pay | Admitting: Family Medicine

## 2024-10-10 ENCOUNTER — Other Ambulatory Visit (INDEPENDENT_AMBULATORY_CARE_PROVIDER_SITE_OTHER)

## 2024-10-10 ENCOUNTER — Ambulatory Visit: Admitting: Orthopaedic Surgery

## 2024-10-10 ENCOUNTER — Encounter: Payer: Self-pay | Admitting: Orthopaedic Surgery

## 2024-10-10 VITALS — Ht 68.5 in | Wt 235.0 lb

## 2024-10-10 DIAGNOSIS — M7061 Trochanteric bursitis, right hip: Secondary | ICD-10-CM | POA: Diagnosis not present

## 2024-10-10 DIAGNOSIS — M25551 Pain in right hip: Secondary | ICD-10-CM

## 2024-10-10 MED ORDER — LIDOCAINE HCL 1 % IJ SOLN
3.0000 mL | INTRAMUSCULAR | Status: AC | PRN
  Administered 2024-10-10: 3 mL

## 2024-10-10 MED ORDER — METHYLPREDNISOLONE ACETATE 40 MG/ML IJ SUSP
40.0000 mg | INTRAMUSCULAR | Status: AC | PRN
  Administered 2024-10-10: 40 mg via INTRA_ARTICULAR

## 2024-10-10 NOTE — Progress Notes (Signed)
 The patient is a 73 year old gentleman I am seeing for the first time but he is a long-term patient of Dr. Barbarann.  He has not been seen in the office for a while though.  He comes in with a right hip pain for about a month now and he points to the lateral aspect of his right hip as a source of his pain.  He denies any groin pain.  He denies any injury.  It does hurt at night when he rolls over on the side.  He does state that he walks on a daily basis.  His BMI is 35.21.  He is not a diabetic.  He does not have any significant active medical issues.  He does walk on a daily basis with his dog.  His wife is with him today.  On examination his right hip moves smoothly and fluidly.  His pain is only to palpation over the proximal greater trochanteric area.  An AP pelvis and lateral right hip shows normal-appearing hips bilaterally.  There were no significant cortical irregularities around the trochanteric area of his hip.  His signs and symptoms are consistent with trochanteric bursitis and tendinitis.  I did recommend a steroid injection over this area and he agreed to it and tolerated well.  Also showed him stretching exercises.  He knows we can repeat this injection in between 8 and 12 weeks if needed    Procedure Note  Patient: Shawn Wood             Date of Birth: Jan 05, 1951           MRN: [id number]             Visit Date: 10/10/2024  Procedures: Visit Diagnoses:  1. Pain in right hip     Large Joint Inj: R hip joint on 10/10/2024 1:14 PM Indications: pain and diagnostic evaluation Details: 22 G 1.5 in needle, lateral approach  Arthrogram: No  Medications: 3 mL lidocaine  1 %; 40 mg methylPREDNISolone  acetate 40 MG/ML Outcome: tolerated well, no immediate complications Procedure, treatment alternatives, risks and benefits explained, specific risks discussed. Consent was given by the patient. Immediately prior to procedure a time out was called to verify the correct patient, procedure,  equipment, support staff and site/side marked as required. Patient was prepped and draped in the usual sterile fashion.

## 2024-10-16 DIAGNOSIS — K080 Exfoliation of teeth due to systemic causes: Secondary | ICD-10-CM | POA: Diagnosis not present

## 2025-07-08 ENCOUNTER — Ambulatory Visit

## 2025-08-29 ENCOUNTER — Encounter: Admitting: Family Medicine
